# Patient Record
Sex: Female | Born: 1965 | Race: White | Hispanic: No | Smoking: Never smoker
Health system: Southern US, Community
[De-identification: ages and names within clinical notes are randomized; demographics above are authoritative.]

## PROBLEM LIST (undated history)

## (undated) DIAGNOSIS — Z8742 Personal history of other diseases of the female genital tract: Secondary | ICD-10-CM

## (undated) DIAGNOSIS — T7840XA Allergy, unspecified, initial encounter: Secondary | ICD-10-CM

## (undated) DIAGNOSIS — B029 Zoster without complications: Secondary | ICD-10-CM

## (undated) DIAGNOSIS — N879 Dysplasia of cervix uteri, unspecified: Secondary | ICD-10-CM

## (undated) DIAGNOSIS — M1990 Unspecified osteoarthritis, unspecified site: Secondary | ICD-10-CM

## (undated) DIAGNOSIS — F419 Anxiety disorder, unspecified: Secondary | ICD-10-CM

## (undated) DIAGNOSIS — G43909 Migraine, unspecified, not intractable, without status migrainosus: Secondary | ICD-10-CM

## (undated) DIAGNOSIS — I639 Cerebral infarction, unspecified: Secondary | ICD-10-CM

## (undated) DIAGNOSIS — N6009 Solitary cyst of unspecified breast: Secondary | ICD-10-CM

## (undated) HISTORY — DX: Allergy, unspecified, initial encounter: T78.40XA

## (undated) HISTORY — DX: Anxiety disorder, unspecified: F41.9

## (undated) HISTORY — DX: Dysplasia of cervix uteri, unspecified: N87.9

## (undated) HISTORY — DX: Personal history of other diseases of the female genital tract: Z87.42

## (undated) HISTORY — DX: Solitary cyst of unspecified breast: N60.09

## (undated) HISTORY — PX: BREAST CYST ASPIRATION: SHX578

## (undated) HISTORY — DX: Cerebral infarction, unspecified: I63.9

## (undated) HISTORY — DX: Unspecified osteoarthritis, unspecified site: M19.90

## (undated) HISTORY — DX: Migraine, unspecified, not intractable, without status migrainosus: G43.909

## (undated) HISTORY — DX: Zoster without complications: B02.9

---

## 2001-09-29 ENCOUNTER — Encounter: Admission: RE | Admit: 2001-09-29 | Discharge: 2001-09-29 | Payer: Self-pay

## 2002-11-05 HISTORY — PX: LEEP: SHX91

## 2007-06-25 ENCOUNTER — Other Ambulatory Visit: Admission: RE | Admit: 2007-06-25 | Discharge: 2007-06-25 | Payer: Self-pay | Admitting: Family Medicine

## 2007-09-07 ENCOUNTER — Emergency Department: Payer: Self-pay | Admitting: Emergency Medicine

## 2007-09-19 ENCOUNTER — Emergency Department: Payer: Self-pay | Admitting: Emergency Medicine

## 2008-09-01 ENCOUNTER — Other Ambulatory Visit: Admission: RE | Admit: 2008-09-01 | Discharge: 2008-09-01 | Payer: Self-pay | Admitting: Family Medicine

## 2010-01-13 ENCOUNTER — Encounter: Admission: RE | Admit: 2010-01-13 | Discharge: 2010-01-13 | Payer: Self-pay | Admitting: Family Medicine

## 2010-02-08 ENCOUNTER — Ambulatory Visit: Admission: RE | Admit: 2010-02-08 | Discharge: 2010-02-08 | Payer: Self-pay | Admitting: Neurology

## 2010-02-13 ENCOUNTER — Encounter: Admission: RE | Admit: 2010-02-13 | Discharge: 2010-02-13 | Payer: Self-pay | Admitting: Neurology

## 2010-11-05 HISTORY — PX: OTHER SURGICAL HISTORY: SHX169

## 2011-01-24 LAB — CSF CELL COUNT WITH DIFFERENTIAL
RBC Count, CSF: 0 /mm3
Tube #: 3
WBC, CSF: 0 /mm3 (ref 0–5)

## 2011-01-24 LAB — GRAM STAIN

## 2011-01-24 LAB — OLIGOCLONAL BANDS, CSF + SERM

## 2011-01-24 LAB — PROTEIN AND GLUCOSE, CSF: Total  Protein, CSF: 27 mg/dL (ref 15–45)

## 2013-01-21 ENCOUNTER — Ambulatory Visit: Payer: Self-pay

## 2013-03-02 ENCOUNTER — Ambulatory Visit (INDEPENDENT_AMBULATORY_CARE_PROVIDER_SITE_OTHER): Payer: 59 | Admitting: General Surgery

## 2013-03-02 ENCOUNTER — Encounter (INDEPENDENT_AMBULATORY_CARE_PROVIDER_SITE_OTHER): Payer: Self-pay | Admitting: General Surgery

## 2013-03-02 VITALS — BP 110/68 | HR 74 | Resp 18 | Ht 68.0 in | Wt 135.0 lb

## 2013-03-02 DIAGNOSIS — K645 Perianal venous thrombosis: Secondary | ICD-10-CM | POA: Insufficient documentation

## 2013-03-02 MED ORDER — HYDROCORTISONE ACE-PRAMOXINE 2.5-1 % RE CREA: TOPICAL_CREAM | Freq: Four times a day (QID) | RECTAL | Status: DC

## 2013-03-02 NOTE — Patient Instructions (Signed)
Bedrest for the next few days Alternate ice packs and tucks pads Baby wipes after bm's Warm tub soaks after bm's

## 2013-03-17 ENCOUNTER — Ambulatory Visit (INDEPENDENT_AMBULATORY_CARE_PROVIDER_SITE_OTHER): Payer: 59 | Admitting: General Surgery

## 2013-03-17 ENCOUNTER — Encounter (INDEPENDENT_AMBULATORY_CARE_PROVIDER_SITE_OTHER): Payer: Self-pay | Admitting: General Surgery

## 2013-03-17 VITALS — BP 140/76 | HR 68 | Temp 97.6°F | Resp 16 | Ht 68.5 in | Wt 136.0 lb

## 2013-03-17 DIAGNOSIS — K645 Perianal venous thrombosis: Secondary | ICD-10-CM

## 2013-03-17 NOTE — Progress Notes (Signed)
Subjective:     Patient ID: Erika Hurley, female   DOB: 07-16-1966, 47 y.o.   MRN: [id number]  HPI The patient is a 47 year old white female who we saw about 3 weeks ago with a thrombosed external hemorrhoid. Since that time her pain has resolved. She denies any problems with constipation. She denies any rectal bleeding.  Review of Systems     Objective:   Physical Exam On exam her perirectal skin looks good. She has multiple small soft hemorrhoidal skin tags circumferentially. There is no edema or thrombosis.    Assessment:     The patient had a thrombosed external hemorrhoid that has resolved. She has no problems with hygiene given her skin tags.     Plan:     At this point I would recommend that she continue to stay hydrated and avoid periods of constipation. If her hemorrhoids flare up again she agrees to call us immediately. Otherwise we will see her back on a when necessary basis

## 2013-03-17 NOTE — Patient Instructions (Signed)
Avoid constipation and try to stay hydrated

## 2013-03-17 NOTE — Progress Notes (Signed)
Subjective:     Patient ID: Erika Hurley, female   DOB: 09-Aug-1966, 47 y.o.   MRN: [id number]  HPI We're asked to see the patient in consultation by Dr. Clyde Canterbury to evaluate her for hemorrhoids. The patient is a 47 year old white female who states that she has had problems with hemorrhoids off and on since childbirth. This past Saturday she was moving some heavy mulch and that evening noticed some painful swelling in her perirectal area. She denied any bleeding. She denied any constipation.  Review of Systems  Constitutional: Negative.   HENT: Negative.   Eyes: Negative.   Respiratory: Negative.   Cardiovascular: Negative.   Gastrointestinal: Positive for rectal pain.  Endocrine: Negative.   Genitourinary: Negative.   Musculoskeletal: Negative.   Skin: Negative.   Allergic/Immunologic: Negative.   Neurological: Negative.   Hematological: Negative.   Psychiatric/Behavioral: Negative.        Objective:   Physical Exam  Constitutional: She is oriented to person, place, and time. She appears well-developed and well-nourished.  HENT:  Head: Normocephalic and atraumatic.  Eyes: Conjunctivae and EOM are normal. Pupils are equal, round, and reactive to light.  Neck: Normal range of motion. Neck supple.  Cardiovascular: Normal rate, regular rhythm and normal heart sounds.   Pulmonary/Chest: Effort normal and breath sounds normal.  Abdominal: Soft. Bowel sounds are normal.  Genitourinary:  There is a thrombosed and edematous external hemorrhoid that encompasses most of the left lateral perirectal area  Musculoskeletal: Normal range of motion.  Neurological: She is alert and oriented to person, place, and time.  Skin: Skin is warm and dry.  Psychiatric: She has a normal mood and affect. Her behavior is normal.       Assessment:     The patient appears to have a thrombosed external hemorrhoid with a lot of edema surrounding it. Because of this I think cutting it will make some of  the swelling and edema worse.     Plan:     At this point I would recommend bedrest for the next several days. I would alternate ice packs and Tucks pads to the area. I would use baby wipes after bowel movements. We will plan to see her back in the next 3 weeks or so to check her progress.

## 2013-06-11 DIAGNOSIS — I639 Cerebral infarction, unspecified: Secondary | ICD-10-CM | POA: Insufficient documentation

## 2014-01-13 ENCOUNTER — Ambulatory Visit: Payer: Self-pay | Admitting: Family Medicine

## 2014-01-19 ENCOUNTER — Ambulatory Visit: Payer: Self-pay | Admitting: Family Medicine

## 2014-07-08 ENCOUNTER — Ambulatory Visit (INDEPENDENT_AMBULATORY_CARE_PROVIDER_SITE_OTHER): Payer: 59 | Admitting: Internal Medicine

## 2014-07-08 ENCOUNTER — Encounter: Payer: Self-pay | Admitting: Internal Medicine

## 2014-07-08 VITALS — BP 108/76 | HR 53 | Temp 98.0°F | Wt 139.0 lb

## 2014-07-08 DIAGNOSIS — M25519 Pain in unspecified shoulder: Secondary | ICD-10-CM

## 2014-07-08 DIAGNOSIS — M25511 Pain in right shoulder: Secondary | ICD-10-CM

## 2014-07-08 MED ORDER — METHYLPREDNISOLONE ACETATE 80 MG/ML IJ SUSP
80.0000 mg | Freq: Once | INTRAMUSCULAR | Status: AC
  Administered 2014-07-08: 80 mg via INTRAMUSCULAR

## 2014-07-08 NOTE — Addendum Note (Signed)
Addended by: Lurlean Nanny on: 07/08/2014 01:43 PM   Modules accepted: Orders

## 2014-07-08 NOTE — Progress Notes (Signed)
Subjective:    Patient ID: Erika Hurley, female    DOB: [birth year], 48 y.o.   MRN: [id number]  HPI  Pt presents to the clinic today with c/o right arm pain. She reports this started within the last 2 weeks. She describes the pain as sharp. It does seem worse with movement. She denies weakness in that arm. She has had similar pain in the past. She was worked up by Dr. Marlou Starks at [facility] in [place]. She has had a cortisone injection into the joint with some relief. the shoulder exercises she was given are not helping. She has also been taking Aleve OTC without any relief. She would like another joint injection. She has a new pt appt with Dr. Deborra Medina 09/2014.   Review of Systems      History reviewed. No pertinent past medical history.  Current Outpatient Prescriptions  Medication Sig Dispense Refill  . cloNIDine (CATAPRES) 0.1 MG tablet Take 0.1 mg by mouth 2 (two) times daily. 1/2 2 times daily      . hydrocortisone-pramoxine (ANALPRAM-HC) 2.5-1 % rectal cream Place rectally 4 (four) times daily. For irritated and painful hemorrhoids  15 g  2  . SUMAtriptan (IMITREX) 50 MG tablet Take 50 mg by mouth every 2 (two) hours as needed for migraine or headache. May repeat in 2 hours if headache persists or recurs.       No current facility-administered medications for this visit.    Allergies  Allergen Reactions  . Sulfa Antibiotics Anaphylaxis    Family History  Problem Relation Age of Onset  . Cancer Mother   . Hypertension Mother     History   Social History  . Marital Status: Divorced    Spouse Name: N/A    Number of Children: N/A  . Years of Education: N/A   Occupational History  . Not on file.   Social History Main Topics  . Smoking status: Never Smoker   . Smokeless tobacco: Not on file  . Alcohol Use: Yes  . Drug Use: No  . Sexual Activity: Not on file   Other Topics Concern  . Not on file   Social History Narrative  . No narrative on file      Constitutional: Denies fever, malaise, fatigue, headache or abrupt weight changes.  Musculoskeletal: Pt reports right shoulder pain. Denies decrease in range of motion, difficulty with gait, muscle pain.    No other specific complaints in a complete review of systems (except as listed in HPI above).  Objective:   Physical Exam  BP 108/76  Pulse 53  Temp(Src) 98 F (36.7 C) (Oral)  Wt 139 lb (63.05 kg)  SpO2 98% Wt Readings from Last 3 Encounters:  07/08/14 139 lb (63.05 kg)  03/17/13 136 lb (61.689 kg)  03/02/13 135 lb (61.236 kg)    General: Appears her stated age, well developed, well nourished in NAD. Cardiovascular: Normal rate and rhythm. S1,S2 noted.  No murmur, rubs or gallops noted.  Pulmonary/Chest: Normal effort and positive vesicular breath sounds. No respiratory distress. No wheezes, rales or ronchi noted.  Musculoskeletal: Normal internal and external ROM of right shoulder. No crepitus noted with ROM. No pain with palpation of the AC joint.        Assessment & Plan:   Right shoulder pain:  Will request xray of right shoulder from Dr. Marlou Starks at [facility] today, advised her that I do not no do joint injections I will give her 80 mg  Depo IM Continue aleve and shoulder exercises  RTC in 09/2014 for your new pt appt with Dr. Deborra Medina

## 2014-07-08 NOTE — Progress Notes (Signed)
Pre visit review using our clinic review tool, if applicable. No additional management support is needed unless otherwise documented below in the visit note. 

## 2014-07-08 NOTE — Patient Instructions (Addendum)

## 2014-07-13 ENCOUNTER — Encounter: Payer: Self-pay | Admitting: Family Medicine

## 2014-07-13 ENCOUNTER — Other Ambulatory Visit: Payer: Self-pay | Admitting: Internal Medicine

## 2014-07-13 DIAGNOSIS — L989 Disorder of the skin and subcutaneous tissue, unspecified: Secondary | ICD-10-CM

## 2014-08-17 ENCOUNTER — Encounter: Payer: Self-pay | Admitting: Family Medicine

## 2014-09-06 ENCOUNTER — Ambulatory Visit (INDEPENDENT_AMBULATORY_CARE_PROVIDER_SITE_OTHER): Payer: 59 | Admitting: Family Medicine

## 2014-09-06 ENCOUNTER — Encounter: Payer: Self-pay | Admitting: Family Medicine

## 2014-09-06 VITALS — BP 116/72 | HR 65 | Temp 97.9°F | Ht 68.0 in | Wt 136.8 lb

## 2014-09-06 DIAGNOSIS — G43909 Migraine, unspecified, not intractable, without status migrainosus: Secondary | ICD-10-CM | POA: Insufficient documentation

## 2014-09-06 DIAGNOSIS — Z8669 Personal history of other diseases of the nervous system and sense organs: Secondary | ICD-10-CM

## 2014-09-06 DIAGNOSIS — Z1211 Encounter for screening for malignant neoplasm of colon: Secondary | ICD-10-CM

## 2014-09-06 DIAGNOSIS — R232 Flushing: Secondary | ICD-10-CM | POA: Insufficient documentation

## 2014-09-06 DIAGNOSIS — G919 Hydrocephalus, unspecified: Secondary | ICD-10-CM | POA: Insufficient documentation

## 2014-09-06 DIAGNOSIS — Z8673 Personal history of transient ischemic attack (TIA), and cerebral infarction without residual deficits: Secondary | ICD-10-CM | POA: Insufficient documentation

## 2014-09-06 DIAGNOSIS — N951 Menopausal and female climacteric states: Secondary | ICD-10-CM

## 2014-09-06 NOTE — Patient Instructions (Signed)
It was great to meet you. We will call you with your GI referral for your colonoscopy.  Please make an appointment to see Dr. Lorelei Pont on your way out.

## 2014-09-06 NOTE — Assessment & Plan Note (Signed)
>  30 minutes spent in face to face time with patient, >50% spent in counselling or coordination of care,reviewing complex history. Continue to follow along with [facility].

## 2014-09-06 NOTE — Progress Notes (Signed)
Pre visit review using our clinic review tool, if applicable. No additional management support is needed unless otherwise documented below in the visit note. 

## 2014-09-06 NOTE — Progress Notes (Signed)
Subjective:    Patient ID: Erika Hurley, female    DOB: [birth year], 48 y.o.   MRN: [id number]  HPI  Pleasant 48 yo female with complex medical history here to establish care.  Has OBGYN who has been managing her hot flashes with clonidine. Had been on OCPs most of her adult life until she was taken off of them when MRI showed she had been having strokes. Since then- severe hot flashes, irritability, vaginal dryness.  Clonidine has helped some with hot flashes.  Complex neuro history- awaiting records. Referred to neuro after she continued to smell cigarette smoke even though she was not around cigarettes.' MRI done in 2011 (in [place]) showed the following: MRI HEAD WITHOUT CONTRAST  Technique: Multiplanar, multiecho pulse sequences of the brain and surrounding structures were obtained according to standard protocol without intravenous contrast.  Comparison: None  Findings: Diffusion imaging does not show any acute or subacute infarction. The brainstem and cerebellum are normal. Within the cerebral hemispheres, there is scattered foci of abnormal FLAIR and T2 signal affecting the hemispheric white matter. These could be old small vessel insults are could represent foci of demyelination. No cortical or large vessel infarction is seen. No mesial temporal lesion. No pituitary mass. No orbital lesion is seen. Paranasal sinuses, middle ears and mastoids are clear.  IMPRESSION: No acute abnormality. The patient does have scattered foci of abnormal FLAIR and T2 signal within the hemispheric white matter, abnormal for this age. The differential diagnosis is that of an early manifestation of small vessel disease versus demyelinating disease.  Provider: Mancel Bale  Since that time, has been followed by Dr. Lavone Neri- [facility]. Notes reviewed in Care everywhere that are available- he is monitoring her for abnormal MRI/?early dementia and hydrocephalus.   Having routine cognitive testing and serial MRIs.  She feels overall she is doing well.  Has never had a colonoscopy.  Current Outpatient Prescriptions on File Prior to Visit  Medication Sig Dispense Refill  . cloNIDine (CATAPRES) 0.1 MG tablet Take 0.1 mg by mouth 2 (two) times daily. 1/2 2 times daily    . hydrocortisone-pramoxine (ANALPRAM-HC) 2.5-1 % rectal cream Place rectally 4 (four) times daily. For irritated and painful hemorrhoids 15 g 2  . SUMAtriptan (IMITREX) 50 MG tablet Take 50 mg by mouth every 2 (two) hours as needed for migraine or headache. May repeat in 2 hours if headache persists or recurs.     No current facility-administered medications on file prior to visit.    Allergies  Allergen Reactions  . Sulfa Antibiotics Anaphylaxis    Past Medical History  Diagnosis Date  . Arthritis   . Migraines   . Stroke     Past Surgical History  Procedure Laterality Date  . Spinal tap  2012  . Leap  2004    Family History  Problem Relation Age of Onset  . Cancer Mother   . Hypertension Mother   . Hyperlipidemia Mother   . Kidney disease Mother   . Stroke Father   . Heart disease Father   . Hypertension Father   . Hyperlipidemia Father   . Kidney disease Father   . Cancer Maternal Grandfather     History   Social History  . Marital Status: Divorced    Spouse Name: N/A    Number of Children: N/A  . Years of Education: N/A   Occupational History  . Not on file.   Social History Main Topics  . Smoking status: Never  Smoker   . Smokeless tobacco: Never Used  . Alcohol Use: Yes  . Drug Use: No  . Sexual Activity: No   Other Topics Concern  . Not on file   Social History Narrative   The PMH, PSH, Social History, Family History, Medications, and allergies have been reviewed in [facility], and have been updated if relevant.   Review of Systems  Constitutional: Negative.   Eyes: Negative.   Respiratory: Negative.   Cardiovascular: Negative.     Gastrointestinal: Negative.   Endocrine: Negative.   Genitourinary: Negative.   Allergic/Immunologic: Negative.   Neurological: Positive for headaches. Negative for dizziness, tremors, seizures, syncope, facial asymmetry, speech difficulty, weakness and numbness.  Hematological: Negative.   Psychiatric/Behavioral: Negative.   All other systems reviewed and are negative.  See HPI    Objective:   Physical Exam  Constitutional: She is oriented to person, place, and time. She appears well-developed and well-nourished. No distress.  HENT:  Head: Normocephalic.  Neck: Normal range of motion. Neck supple.  Cardiovascular: Normal rate and regular rhythm.   Pulmonary/Chest: Effort normal and breath sounds normal.  Musculoskeletal: Normal range of motion.  Neurological: She is alert and oriented to person, place, and time. No cranial nerve deficit. Coordination normal.  Skin: Skin is warm and dry.  Psychiatric: She has a normal mood and affect. Her behavior is normal. Judgment and thought content normal.  Nursing note and vitals reviewed.  BP 116/72 mmHg  Pulse 65  Temp(Src) 97.9 F (36.6 C) (Oral)  Ht 5\' 8"  (1.727 m)  Wt 136 lb 12 oz (62.029 kg)  BMI 20.80 kg/m2  SpO2 97%        Assessment & Plan:

## 2014-09-07 ENCOUNTER — Encounter: Payer: Self-pay | Admitting: Family Medicine

## 2014-09-08 ENCOUNTER — Ambulatory Visit (INDEPENDENT_AMBULATORY_CARE_PROVIDER_SITE_OTHER): Payer: 59 | Admitting: Family Medicine

## 2014-09-08 ENCOUNTER — Encounter: Payer: Self-pay | Admitting: Family Medicine

## 2014-09-08 VITALS — BP 104/70 | HR 64 | Temp 98.5°F | Ht 68.0 in | Wt 139.5 lb

## 2014-09-08 DIAGNOSIS — M7541 Impingement syndrome of right shoulder: Secondary | ICD-10-CM

## 2014-09-08 DIAGNOSIS — M75101 Unspecified rotator cuff tear or rupture of right shoulder, not specified as traumatic: Secondary | ICD-10-CM

## 2014-09-08 DIAGNOSIS — M7551 Bursitis of right shoulder: Secondary | ICD-10-CM

## 2014-09-08 DIAGNOSIS — M25511 Pain in right shoulder: Secondary | ICD-10-CM

## 2014-09-08 MED ORDER — SERTRALINE HCL 100 MG PO TABS: 100.0000 mg | ORAL_TABLET | Freq: Every day | ORAL | Status: DC

## 2014-09-08 MED ORDER — ALPRAZOLAM 0.25 MG PO TABS: 0.2500 mg | ORAL_TABLET | Freq: Every evening | ORAL | Status: DC | PRN

## 2014-09-08 MED ORDER — METHYLPREDNISOLONE ACETATE 40 MG/ML IJ SUSP
80.0000 mg | Freq: Once | INTRAMUSCULAR | Status: AC
  Administered 2014-09-08: 80 mg via INTRA_ARTICULAR

## 2014-09-08 NOTE — Progress Notes (Signed)
Dr. Frederico Hamman T. Treshawn Allen, Erika Hurley, [facility] [place], [address] Phone: [phone] Fax: [phone]  09/08/2014  Patient: Erika Hurley, MRN: [id number], DOB: Dec 13, 1965, 48 y.o.  Primary Physician:  Arnette Norris, Erika Hurley  Chief Complaint: Shoulder Pain  Subjective:   Consulting physician: Dr. Deborra Medina. Reason: Right shoulder pain  This 48 y.o. female patient noted above presents with shoulder pain that has been ongoing for 2 mo there is no history of trauma or accident recently The patient denies neck pain or radicular symptoms. Denies dislocation, subluxation, separation of the shoulder. The patient does complain of pain in the overhead plane with significant painful arc of motion.  R sided shoulder pain for about 1 month and pain with abduction.  Impingement.   Saw Dr. Noemi Chapel in [place]. 2 years ago.  ? Left shoulder subac bursitis.   Medications Tried: Tylenol, NSAIDS Ice or Heat: minimally helpful Tried PT: she did do this for her left shoulder2 years ago.  Prior shoulder Injury: No Prior surgery: No Prior fracture: No  The PMH, PSH, Social History, Family History, Medications, and allergies have been reviewed in [facility], and have been updated if relevant.  GEN: No fevers, chills. Nontoxic. Primarily MSK c/o today. MSK: Detailed in the HPI GI: tolerating PO intake without difficulty Neuro: No numbness, parasthesias, or tingling associated. Otherwise, the pertinent positives and negatives are listed above and in the HPI, otherwise a full review of systems has been reviewed and is negative unless noted positive.   Objective:   Blood pressure 104/70, pulse 64, temperature 98.5 F (36.9 C), temperature source Oral, height 5\' 8"  (1.727 m), weight 139 lb 8 oz (63.277 kg).  GEN: Well-developed,well-nourished,in no acute distress; alert,appropriate and cooperative throughout examination HEENT: Normocephalic and  atraumatic without obvious abnormalities. Ears, externally no deformities PULM: Breathing comfortably in no respiratory distress EXT: No clubbing, cyanosis, or edema PSYCH: Normally interactive. Cooperative during the interview. Pleasant. Friendly and conversant. Not anxious or depressed appearing. Normal, full affect.  Shoulder: R Inspection: No muscle wasting or winging Ecchymosis/edema: neg  AC joint, scapula, clavicle: NT Cervical spine: NT, full ROM Spurling's: neg Abduction: full, 5/5 Flexion: full, 5/5 IR, full, lift-off: 5/5 ER at neutral: full, 5/5 AC crossover: neg Neer: pos Hawkins: pos Drop Test: neg Empty Can: pos Supraspinatus insertion: mild-mod T Bicipital groove: NT Speed's: neg Yergason's: neg Sulcus sign: neg Scapular dyskinesis: none C5-T1 intact  Neuro: Sensation intact Grip 5/5   Radiology: No results found.  Assessment and Plan:    Right shoulder pain - Plan: methylPREDNISolone acetate (DEPO-MEDROL) injection 80 mg  Bursitis of shoulder, right [M75.51]  Rotator cuff impingement syndrome, right [M75.101]  Rotator cuff syndrome, right [M75.101]  The patient has 2 months of symptoms, and she did quite well about 2 years ago when she had a subacromial injection in the opposite shoulder by Dr. Noemi Chapel.  She is having some significant impingement on the right.  Strength is perfectly normal.  Cuff appears intact.  Minimal AC joint involvement.  SubAC Injection, R Verbal consent was obtained from the patient. Risks (including rare infection), benefits, and alternatives were explained. Patient prepped with Chloraprep and Ethyl Chloride used for anesthesia. The subacromial space was injected using the posterior approach. The patient tolerated the procedure well and had decreased pain post injection. No complications. Injection: 8 cc of Lidocaine 1% and Depo-Medrol 80 mg. Needle: 22 gauge    Follow-up: No Follow-up on file.  New  Prescriptions   No  medications on file   No orders of the defined types were placed in this encounter.    Signed,  Maud Deed. Roma Bierlein, Erika Hurley   Patient's Medications  New Prescriptions   No medications on file  Previous Medications   CLONIDINE (CATAPRES) 0.1 MG TABLET    Take 0.1 mg by mouth 2 (two) times daily. 1/2 2 times daily   HYDROCORTISONE-PRAMOXINE (ANALPRAM-HC) 2.5-1 % RECTAL CREAM    Place rectally 4 (four) times daily. For irritated and painful hemorrhoids   SUMATRIPTAN (IMITREX) 50 MG TABLET    Take 50 mg by mouth every 2 (two) hours as needed for migraine or headache. May repeat in 2 hours if headache persists or recurs.  Modified Medications   Modified Medication Previous Medication   ALPRAZOLAM (XANAX) 0.25 MG TABLET ALPRAZolam (XANAX) 0.25 MG tablet      Take 1 tablet (0.25 mg total) by mouth at bedtime as needed for anxiety.    Take 1 tablet (0.25 mg total) by mouth at bedtime as needed for anxiety.   SERTRALINE (ZOLOFT) 100 MG TABLET sertraline (ZOLOFT) 100 MG tablet      Take 1 tablet (100 mg total) by mouth daily. Take 1.5 tabs by mouth daily    Take 1 tablet (100 mg total) by mouth daily. Take 1.5 tabs by mouth daily  Discontinued Medications   No medications on file

## 2014-09-08 NOTE — Telephone Encounter (Signed)
Pt recently established care. Ok to fill?

## 2014-09-08 NOTE — Progress Notes (Signed)
Pre visit review using our clinic review tool, if applicable. No additional management support is needed unless otherwise documented below in the visit note. 

## 2014-09-08 NOTE — Addendum Note (Signed)
Addended by: Modena Nunnery on: 09/08/2014 04:15 PM   Modules accepted: Orders

## 2014-10-12 ENCOUNTER — Telehealth: Payer: Self-pay | Admitting: Family Medicine

## 2014-10-12 DIAGNOSIS — H209 Unspecified iridocyclitis: Secondary | ICD-10-CM | POA: Insufficient documentation

## 2014-10-12 NOTE — Telephone Encounter (Signed)
Pt is requesting a refferal to Dr Zadie Rhine [phone]  At [address] [place], [place] [address]. She has a condition called iritis? And was seen yesterday and will be seen again Friday.

## 2014-10-12 NOTE — Telephone Encounter (Signed)
Referral placed.

## 2014-10-18 ENCOUNTER — Encounter: Payer: Self-pay | Admitting: Family Medicine

## 2014-10-28 ENCOUNTER — Encounter: Payer: Self-pay | Admitting: Family Medicine

## 2014-11-01 ENCOUNTER — Ambulatory Visit (INDEPENDENT_AMBULATORY_CARE_PROVIDER_SITE_OTHER)
Admission: RE | Admit: 2014-11-01 | Discharge: 2014-11-01 | Disposition: A | Discharge status: Home / Self Care | Payer: 59 | Source: Ambulatory Visit | Attending: Family Medicine | Admitting: Family Medicine

## 2014-11-01 ENCOUNTER — Encounter: Payer: Self-pay | Admitting: Family Medicine

## 2014-11-01 ENCOUNTER — Ambulatory Visit (INDEPENDENT_AMBULATORY_CARE_PROVIDER_SITE_OTHER): Payer: 59 | Admitting: Family Medicine

## 2014-11-01 VITALS — BP 100/60 | HR 60 | Temp 97.7°F | Wt 137.5 lb

## 2014-11-01 DIAGNOSIS — M75101 Unspecified rotator cuff tear or rupture of right shoulder, not specified as traumatic: Secondary | ICD-10-CM

## 2014-11-01 DIAGNOSIS — M7541 Impingement syndrome of right shoulder: Secondary | ICD-10-CM

## 2014-11-01 DIAGNOSIS — M7551 Bursitis of right shoulder: Secondary | ICD-10-CM

## 2014-11-01 MED ORDER — DICLOFENAC SODIUM 75 MG PO TBEC: 75.0000 mg | DELAYED_RELEASE_TABLET | Freq: Two times a day (BID) | ORAL | Status: DC

## 2014-11-01 NOTE — Progress Notes (Signed)
Dr. Frederico Hamman T. Izic Stfort, MD, [facility] [place], [address] Phone: [phone] Fax: [phone]  11/01/2014  Patient: Erika Hurley, MRN: [id number], DOB: 1966-06-27, 48 y.o.  Primary Physician:  Arnette Norris, MD  Chief Complaint: Shoulder Pain  Subjective:   Right shoulder, pain down through the right shoulder. I saw her about 6 weeks ago, and at that point we did a subacromial injection on the right shoulder. She had relief of symptoms for about 7-10 days, but then her symptoms basically have come back.  I was mistaken on her last office visit, and I thought that she had done formal physical therapy before when she had seen Dr. Noemi Chapel 2 years ago for her left-sided shoulder pain. She has not. She has not been doing her home exercise program.  No new injury   09/08/2014 Last OV with Owens Loffler, MD  Consulting physician: Dr. Deborra Medina. Reason: Right shoulder pain  This 48 y.o. female patient noted above presents with shoulder pain that has been ongoing for 2 mo there is no history of trauma or accident recently The patient denies neck pain or radicular symptoms. Denies dislocation, subluxation, separation of the shoulder. The patient does complain of pain in the overhead plane with significant painful arc of motion.  R sided shoulder pain for about 1 month and pain with abduction.  Impingement.   Saw Dr. Noemi Chapel in [place]. 2 years ago.  ? Left shoulder subac bursitis.   Medications Tried: Tylenol, NSAIDS Ice or Heat: minimally helpful Tried PT: she did do this for her left shoulder2 years ago.  Prior shoulder Injury: No Prior surgery: No Prior fracture: No  The PMH, PSH, Social History, Family History, Medications, and allergies have been reviewed in [facility], and have been updated if relevant.  GEN: No fevers, chills. Nontoxic. Primarily MSK c/o today. MSK: Detailed in the HPI GI: tolerating PO intake  without difficulty Neuro: No numbness, parasthesias, or tingling associated. Otherwise, the pertinent positives and negatives are listed above and in the HPI, otherwise a full review of systems has been reviewed and is negative unless noted positive.   Objective:   Blood pressure 100/60, pulse 60, temperature 97.7 F (36.5 C), temperature source Oral, weight 137 lb 8 oz (62.37 kg).  GEN: Well-developed,well-nourished,in no acute distress; alert,appropriate and cooperative throughout examination HEENT: Normocephalic and atraumatic without obvious abnormalities. Ears, externally no deformities PULM: Breathing comfortably in no respiratory distress EXT: No clubbing, cyanosis, or edema PSYCH: Normally interactive. Cooperative during the interview. Pleasant. Friendly and conversant. Not anxious or depressed appearing. Normal, full affect.  Shoulder: R Inspection: No muscle wasting or winging Ecchymosis/edema: neg  AC joint, scapula, clavicle: NT Cervical spine: NT, full ROM Spurling's: neg Abduction: full, 5/5 Flexion: full, 5/5 IR, full, lift-off: 5/5 ER at neutral: full, 5/5 AC crossover: neg Neer: pos Hawkins: pos Drop Test: neg Empty Can: pos Supraspinatus insertion: mild-mod T Bicipital groove: NT Speed's: neg Yergason's: neg Sulcus sign: neg Scapular dyskinesis: none C5-T1 intact  Neuro: Sensation intact Grip 5/5   Radiology: Dg Shoulder Right  11/01/2014   CLINICAL DATA:  Impingement syndrome.  Bursitis.  EXAM: RIGHT SHOULDER - 2+ VIEW  COMPARISON:  None.  FINDINGS: There is no evidence of fracture or dislocation. There is no evidence of arthropathy or other focal bone abnormality. Soft tissues are unremarkable.  IMPRESSION: Negative.   Electronically Signed   By: Marcello Moores  Register   On: 11/01/2014 11:46  Assessment and Plan:    Rotator cuff impingement syndrome, right [M75.101] - Plan: DG Shoulder Right, Ambulatory referral to Physical Therapy  Bursitis of  shoulder, right [M75.51] - Plan: DG Shoulder Right, Ambulatory referral to Physical Therapy  >25 minutes spent in face to face time with patient, >50% spent in counselling or coordination of care: the patient wanted me to repeat her subacromial injection, but given her prior response and the timing, do not think that that would be appropriate.   We reviewed the involved anatomy again. Her strength is excellent, really do not think that she has had any significant tearing of her cuff. She has not filled classic conservative management, and I am going to have her start some oral Voltaren, and start with some formal physical therapy for rotator cuff strengthening and scapular stabilization.  A rehabilitation program from the [employer] [facility] [employer] was reviewed with the patient face to face for their condition.   Follow-up: Return in about 6 weeks (around 12/13/2014).  New Prescriptions   DICLOFENAC (VOLTAREN) 75 MG EC TABLET    Take 1 tablet (75 mg total) by mouth 2 (two) times daily.   Orders Placed This Encounter  Procedures  . DG Shoulder Right  . Ambulatory referral to Physical Therapy    Signed,  Frederico Hamman T. Su Duma, MD   Patient's Medications  New Prescriptions   DICLOFENAC (VOLTAREN) 75 MG EC TABLET    Take 1 tablet (75 mg total) by mouth 2 (two) times daily.  Previous Medications   ALPRAZOLAM (XANAX) 0.25 MG TABLET    Take 1 tablet (0.25 mg total) by mouth at bedtime as needed for anxiety.   CLONIDINE (CATAPRES) 0.1 MG TABLET    Take 0.1 mg by mouth 2 (two) times daily. 1/2 2 times daily   HYDROCORTISONE-PRAMOXINE (ANALPRAM-HC) 2.5-1 % RECTAL CREAM    Place rectally 4 (four) times daily. For irritated and painful hemorrhoids   SERTRALINE (ZOLOFT) 100 MG TABLET    Take 1 tablet (100 mg total) by mouth daily. Take 1.5 tabs by mouth daily   SUMATRIPTAN (IMITREX) 50 MG TABLET    Take 50 mg by mouth every 2 (two) hours as needed for migraine or headache. May repeat in 2  hours if headache persists or recurs.  Modified Medications   No medications on file  Discontinued Medications   No medications on file

## 2014-11-01 NOTE — Progress Notes (Signed)
Pre visit review using our clinic review tool, if applicable. No additional management support is needed unless otherwise documented below in the visit note. 

## 2014-11-02 ENCOUNTER — Ambulatory Visit: Payer: 59 | Admitting: Family Medicine

## 2015-03-12 IMAGING — CR DG LUMBAR SPINE 2-3V
1 series · 3 of 3 positions shown · non-contrast
Comparison: none

REASON FOR EXAM: back pain
COMMENTS:

PROCEDURE:     DXR - DXR LUMBAR SPINE AP AND LATERAL  - January 21, 2013  [DATE]
RESULT:     Degenerative changes lumbar spine. No acute abnormality.
Pedicles are intact.

[Series 1: t lumbar spine ap · 0.14mm/px · 3 of 3 slices shown]
[im 1/3]
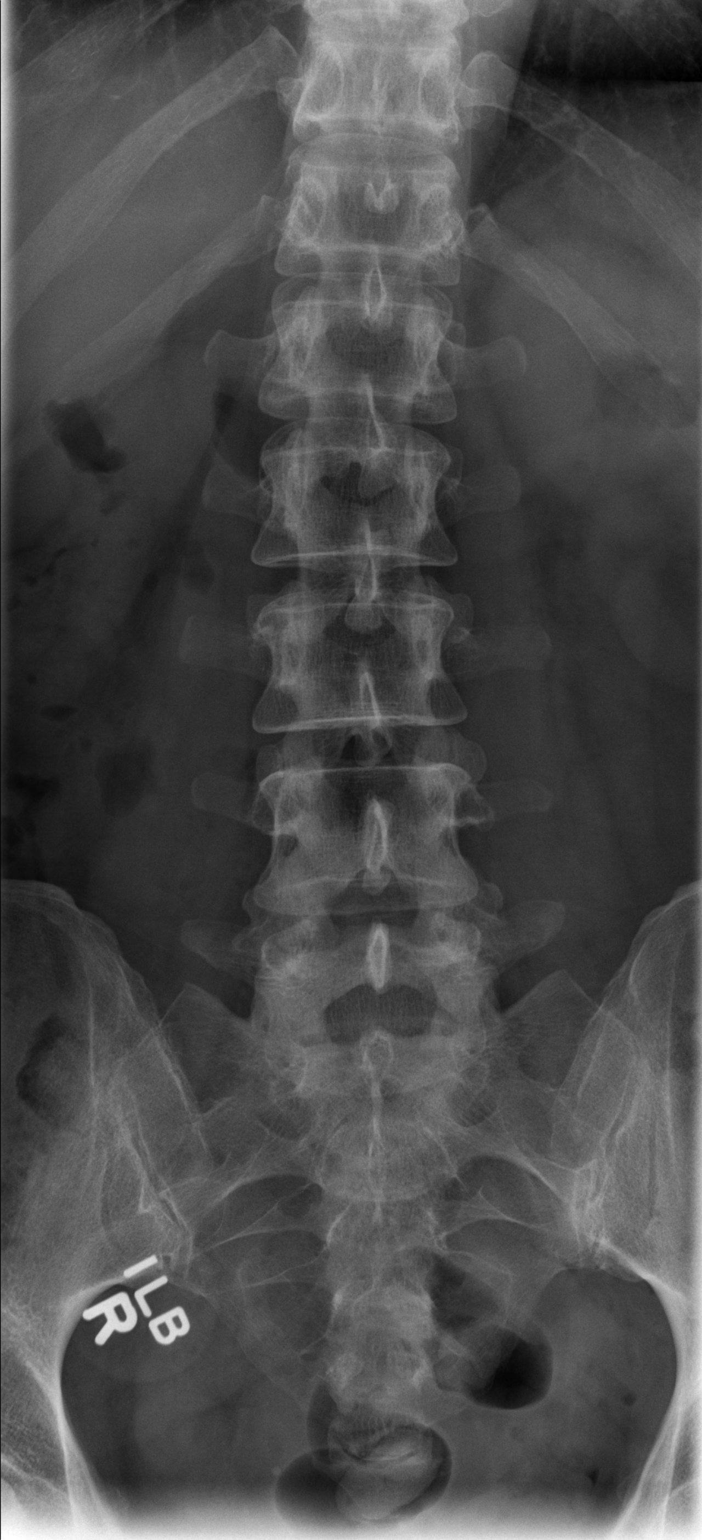
[im 2/3]
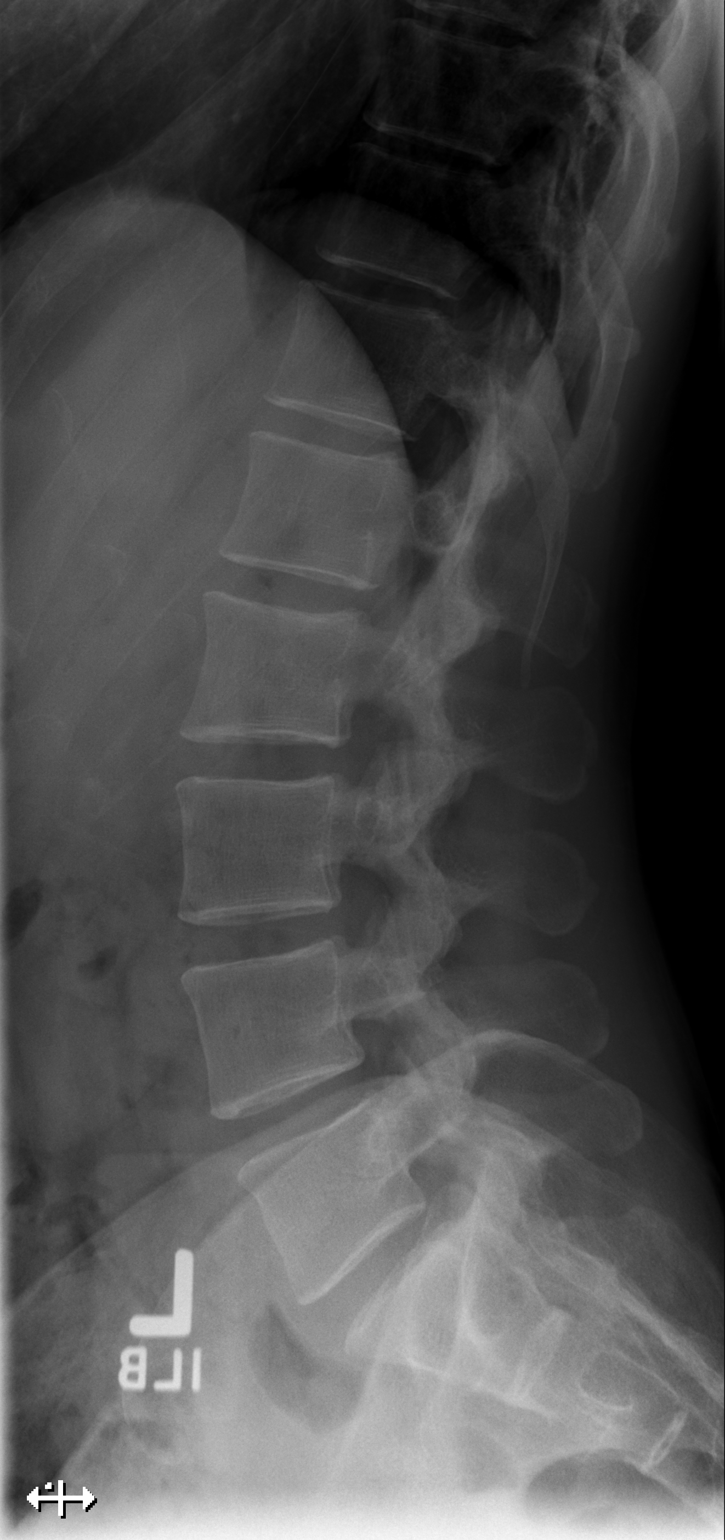
[im 3/3]
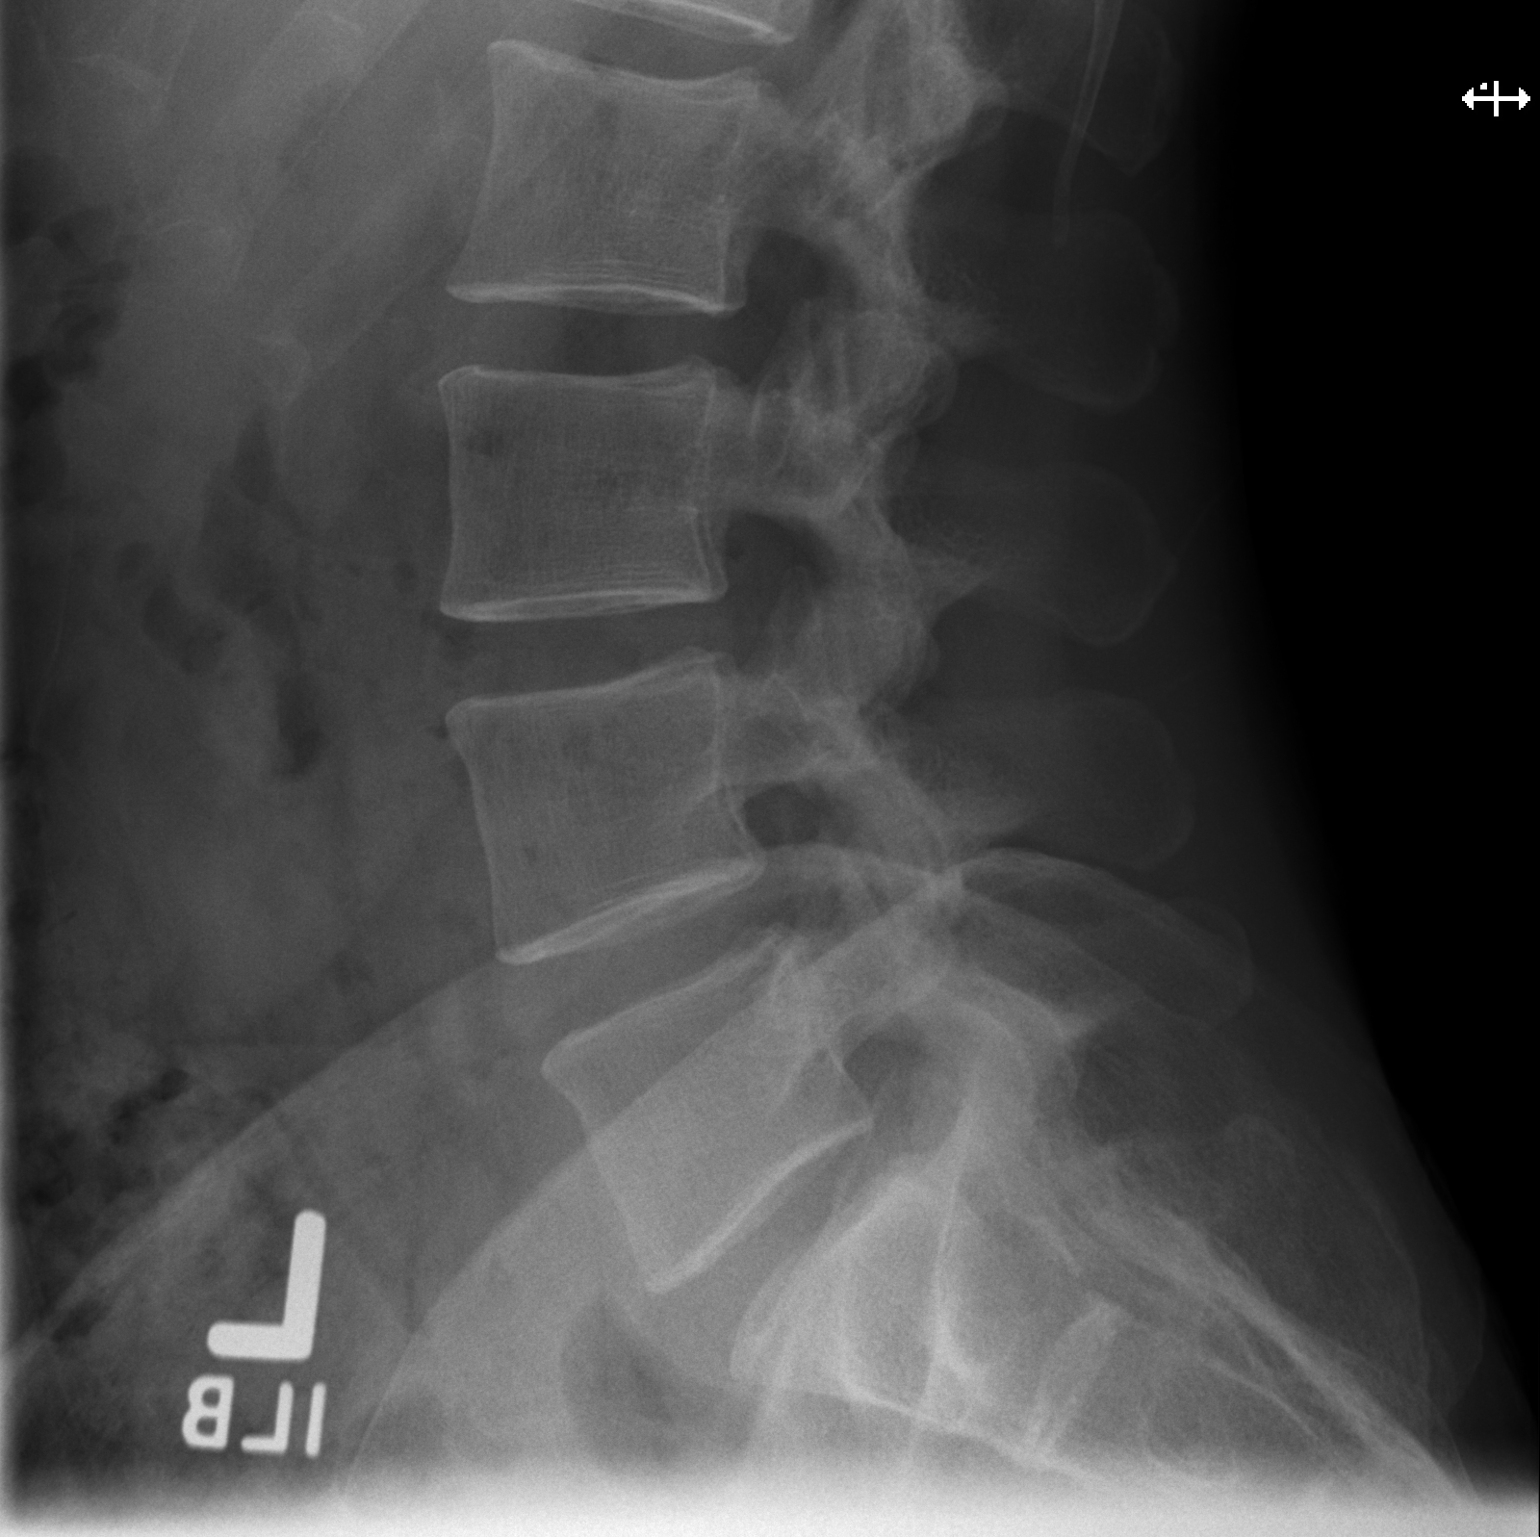

[3 of 3 positions shown; findings below may reference images not displayed]

IMPRESSION: No acute abnormality.

## 2015-09-07 LAB — HM PAP SMEAR: HM Pap smear: NEGATIVE

## 2015-09-15 ENCOUNTER — Ambulatory Visit (INDEPENDENT_AMBULATORY_CARE_PROVIDER_SITE_OTHER): Payer: 59 | Admitting: Nurse Practitioner

## 2015-09-15 ENCOUNTER — Encounter: Payer: Self-pay | Admitting: Nurse Practitioner

## 2015-09-15 VITALS — BP 102/62 | HR 72 | Temp 97.9°F | Ht 68.0 in | Wt 141.0 lb

## 2015-09-15 DIAGNOSIS — N951 Menopausal and female climacteric states: Secondary | ICD-10-CM | POA: Diagnosis not present

## 2015-09-15 DIAGNOSIS — Z130 Encounter for screening for diseases of the blood and blood-forming organs and certain disorders involving the immune mechanism: Secondary | ICD-10-CM

## 2015-09-15 DIAGNOSIS — E559 Vitamin D deficiency, unspecified: Secondary | ICD-10-CM | POA: Diagnosis not present

## 2015-09-15 DIAGNOSIS — R232 Flushing: Secondary | ICD-10-CM

## 2015-09-15 DIAGNOSIS — K645 Perianal venous thrombosis: Secondary | ICD-10-CM

## 2015-09-15 DIAGNOSIS — Z1329 Encounter for screening for other suspected endocrine disorder: Secondary | ICD-10-CM | POA: Diagnosis not present

## 2015-09-15 DIAGNOSIS — Z8673 Personal history of transient ischemic attack (TIA), and cerebral infarction without residual deficits: Secondary | ICD-10-CM

## 2015-09-15 DIAGNOSIS — Z7189 Other specified counseling: Secondary | ICD-10-CM

## 2015-09-15 DIAGNOSIS — Z23 Encounter for immunization: Secondary | ICD-10-CM | POA: Insufficient documentation

## 2015-09-15 DIAGNOSIS — Z8669 Personal history of other diseases of the nervous system and sense organs: Secondary | ICD-10-CM

## 2015-09-15 DIAGNOSIS — Z131 Encounter for screening for diabetes mellitus: Secondary | ICD-10-CM | POA: Diagnosis not present

## 2015-09-15 DIAGNOSIS — E785 Hyperlipidemia, unspecified: Secondary | ICD-10-CM | POA: Diagnosis not present

## 2015-09-15 DIAGNOSIS — Z7689 Persons encountering health services in other specified circumstances: Secondary | ICD-10-CM

## 2015-09-15 DIAGNOSIS — H209 Unspecified iridocyclitis: Secondary | ICD-10-CM

## 2015-09-15 LAB — CBC WITH DIFFERENTIAL/PLATELET
BASOS PCT: 0.5 % (ref 0.0–3.0)
Basophils Absolute: 0 10*3/uL (ref 0.0–0.1)
EOS PCT: 0.6 % (ref 0.0–5.0)
Eosinophils Absolute: 0 10*3/uL (ref 0.0–0.7)
HCT: 40.1 % (ref 36.0–46.0)
HEMOGLOBIN: 13.3 g/dL (ref 12.0–15.0)
LYMPHS ABS: 2.2 10*3/uL (ref 0.7–4.0)
Lymphocytes Relative: 33.8 % (ref 12.0–46.0)
MCHC: 33.2 g/dL (ref 30.0–36.0)
MCV: 93.9 fl (ref 78.0–100.0)
MONO ABS: 0.4 10*3/uL (ref 0.1–1.0)
Monocytes Relative: 6.9 % (ref 3.0–12.0)
NEUTROS PCT: 58.2 % (ref 43.0–77.0)
Neutro Abs: 3.8 10*3/uL (ref 1.4–7.7)
Platelets: 317 10*3/uL (ref 150.0–400.0)
RBC: 4.27 Mil/uL (ref 3.87–5.11)
RDW: 12.7 % (ref 11.5–15.5)
WBC: 6.4 10*3/uL (ref 4.0–10.5)

## 2015-09-15 LAB — COMPREHENSIVE METABOLIC PANEL
ALBUMIN: 4.5 g/dL (ref 3.5–5.2)
ALT: 14 U/L (ref 0–35)
AST: 21 U/L (ref 0–37)
Alkaline Phosphatase: 104 U/L (ref 39–117)
BUN: 10 mg/dL (ref 6–23)
CHLORIDE: 104 meq/L (ref 96–112)
CO2: 28 mEq/L (ref 19–32)
Calcium: 10 mg/dL (ref 8.4–10.5)
Creatinine, Ser: 0.87 mg/dL (ref 0.40–1.20)
GFR: 72.83 mL/min (ref >60.00)
Glucose, Bld: 100 mg/dL — ABNORMAL HIGH (ref 70–99)
POTASSIUM: 4.7 meq/L (ref 3.5–5.1)
SODIUM: 141 meq/L (ref 135–145)
Total Bilirubin: 0.5 mg/dL (ref 0.2–1.2)
Total Protein: 7.7 g/dL (ref 6.0–8.3)

## 2015-09-15 LAB — TSH: TSH: 1.27 u[IU]/mL (ref 0.35–4.50)

## 2015-09-15 LAB — LDL CHOLESTEROL, DIRECT: LDL DIRECT: 192 mg/dL

## 2015-09-15 LAB — HEMOGLOBIN A1C: HEMOGLOBIN A1C: 5.9 % (ref 4.6–6.5)

## 2015-09-15 LAB — VITAMIN D 25 HYDROXY (VIT D DEFICIENCY, FRACTURES): VITD: 30.92 ng/mL (ref 30.00–100.00)

## 2015-09-15 NOTE — Progress Notes (Signed)
Patient ID: Erika Hurley, female    DOB: 1966/05/14  Age: 49 y.o. MRN: [id number]  CC: Establish Care   HPI Erika Hurley presents for establishing care (from [facility]) and need for tdap update.   1) Seen by Dr. Marjory Lies in 2015 Denies need for refills.  Lab work- needs Tdap- okay to update  History Erika Hurley has a past medical history of Arthritis; Migraines; and Stroke ([place]).   Erika Hurley has past surgical history that includes spinal tap (2012) and leap (2004).   Her family history includes Cancer in her maternal grandfather and mother; Heart disease in her father; Hyperlipidemia in her father and mother; Hypertension in her father and mother; Kidney disease in her father and mother; Stroke in her father.Erika Hurley reports that Erika Hurley has never smoked. Erika Hurley has never used smokeless tobacco. Erika Hurley reports that Erika Hurley drinks about 1.2 oz of alcohol per week. Erika Hurley reports that Erika Hurley does not use illicit drugs.  Outpatient Prescriptions Prior to Visit  Medication Sig Dispense Refill  . ALPRAZolam (XANAX) 0.25 MG tablet Take 1 tablet (0.25 mg total) by mouth at bedtime as needed for anxiety. 30 tablet 0  . cloNIDine (CATAPRES) 0.1 MG tablet Take 0.1 mg by mouth 2 (two) times daily. 1/2 2 times daily    . diclofenac (VOLTAREN) 75 MG EC tablet Take 1 tablet (75 mg total) by mouth 2 (two) times daily. 60 tablet 3  . hydrocortisone-pramoxine (ANALPRAM-HC) 2.5-1 % rectal cream Place rectally 4 (four) times daily. For irritated and painful hemorrhoids 15 g 2  . sertraline (ZOLOFT) 100 MG tablet Take 1 tablet (100 mg total) by mouth daily. Take 1.5 tabs by mouth daily 45 tablet 1  . SUMAtriptan (IMITREX) 50 MG tablet Take 50 mg by mouth every 2 (two) hours as needed for migraine or headache. May repeat in 2 hours if headache persists or recurs.     No facility-administered medications prior to visit.    ROS Review of Systems  Constitutional: Negative for fever, chills, diaphoresis and fatigue.  Respiratory:  Negative for chest tightness, shortness of breath and wheezing.   Cardiovascular: Negative for chest pain, palpitations and leg swelling.  Gastrointestinal: Negative for nausea, vomiting, diarrhea and rectal pain.  Skin: Negative for rash.  Neurological: Negative for dizziness, weakness, numbness and headaches.  Psychiatric/Behavioral: The patient is not nervous/anxious.     Objective:  BP 102/62 mmHg  Pulse 72  Temp(Src) 97.9 F (36.6 C) (Oral)  Ht 5\' 8"  (1.727 m)  Wt 141 lb (63.957 kg)  BMI 21.44 kg/m2  SpO2 97%  Physical Exam  Constitutional: Erika Hurley is oriented to person, place, and time. Erika Hurley appears well-developed and well-nourished. No distress.  HENT:  Head: Normocephalic and atraumatic.  Right Ear: External ear normal.  Left Ear: External ear normal.  Cardiovascular: Normal rate, regular rhythm, normal heart sounds and intact distal pulses.  Exam reveals no gallop and no friction rub.   No murmur heard. Pulmonary/Chest: Effort normal and breath sounds normal. No respiratory distress. Erika Hurley has no wheezes. Erika Hurley has no rales. Erika Hurley exhibits no tenderness.  Neurological: Erika Hurley is alert and oriented to person, place, and time. No cranial nerve deficit. Erika Hurley exhibits normal muscle tone. Coordination normal.  Skin: Skin is warm and dry. No rash noted. Erika Hurley is not diaphoretic.  Psychiatric: Erika Hurley has a normal mood and affect. Her behavior is normal. Judgment and thought content normal.   Assessment & Plan:   Erika Hurley was seen today for establish care.  Diagnoses and  all orders for this visit:  Iritis  Hot flashes  Thrombosed external hemorrhoids  Encounter to establish care  Screening for deficiency anemia -     CBC with Differential/Platelet  Screening for diabetes mellitus -     Comprehensive metabolic panel -     Hemoglobin A1c  Screening for thyroid disorder -     TSH  Vitamin D deficiency -     Vit D  25 hydroxy (rtn osteoporosis monitoring)  Hyperlipidemia -      Direct LDL  Need for Tdap vaccination -     Tdap vaccine greater than or equal to 7yo IM  I am having Erika Hurley maintain her hydrocortisone-pramoxine, cloNIDine, SUMAtriptan, sertraline, ALPRAZolam, diclofenac, and cyclobenzaprine.  Meds ordered this encounter  Medications  . cyclobenzaprine (FLEXERIL) 10 MG tablet    Sig: Take 10 mg by mouth 3 (three) times daily as needed for muscle spasms.     Follow-up: Return in about 3 months (around 12/16/2015) for CPE.

## 2015-09-15 NOTE — Patient Instructions (Signed)
Please visit the lab today.   Thank you for getting your Tdap.   Welcome to [facility]! Nice to meet you.

## 2015-09-21 DIAGNOSIS — Z23 Encounter for immunization: Secondary | ICD-10-CM | POA: Insufficient documentation

## 2015-09-21 DIAGNOSIS — E785 Hyperlipidemia, unspecified: Secondary | ICD-10-CM | POA: Insufficient documentation

## 2015-09-21 NOTE — Assessment & Plan Note (Signed)
Stable currently, no concerns today, imitrex helpful

## 2015-09-21 NOTE — Assessment & Plan Note (Signed)
Still seeing [facility]. Still smells smoke like smell and has occasional memory concerns. Will follow

## 2015-09-21 NOTE — Assessment & Plan Note (Signed)
Obtaining labs today for high cholesterol levels reported by pt. Will obtain Direct LDL today

## 2015-09-21 NOTE — Assessment & Plan Note (Signed)
Uncontrolled. Zoloft and clonidine supposedly helpful. Will follow

## 2015-09-21 NOTE — Assessment & Plan Note (Signed)
Stable currently, sees eye Dr. Frequently

## 2015-09-21 NOTE — Assessment & Plan Note (Signed)
Tdap given today for update.

## 2016-01-09 ENCOUNTER — Encounter: Payer: Self-pay | Admitting: Nurse Practitioner

## 2016-01-09 ENCOUNTER — Ambulatory Visit (INDEPENDENT_AMBULATORY_CARE_PROVIDER_SITE_OTHER): Payer: 59 | Admitting: Nurse Practitioner

## 2016-01-09 VITALS — BP 102/72 | HR 72 | Temp 98.3°F | Resp 14 | Ht 68.0 in | Wt 143.4 lb

## 2016-01-09 DIAGNOSIS — B370 Candidal stomatitis: Secondary | ICD-10-CM | POA: Diagnosis not present

## 2016-01-09 DIAGNOSIS — B3781 Candidal esophagitis: Secondary | ICD-10-CM

## 2016-01-09 DIAGNOSIS — E785 Hyperlipidemia, unspecified: Secondary | ICD-10-CM

## 2016-01-09 MED ORDER — MAGIC MOUTHWASH: 5.0000 mL | Freq: Four times a day (QID) | ORAL | Status: DC

## 2016-01-09 MED ORDER — ROSUVASTATIN CALCIUM 5 MG PO TABS: 5.0000 mg | ORAL_TABLET | Freq: Every day | ORAL | Status: DC

## 2016-01-09 NOTE — Assessment & Plan Note (Signed)
Starting on Rosuvastatin at night for elevated LDL (work had 202 results recently).   Fish oil encouraged  FU for labs LFTs and BMET in 1 month

## 2016-01-09 NOTE — Patient Instructions (Addendum)
1 Capsule of the Fish oil + Rosuvastatin at night   Follow up in 1 month w/ labs  Swish and swallow up to 4 times a day as instructed

## 2016-01-09 NOTE — Progress Notes (Signed)
Patient ID: Erika Hurley, female    DOB: 02-05-1966  Age: 50 y.o. MRN: [id number]  CC: Hyperlipidemia and Thrush   HPI Erika Hurley presents for CC of hyperlipidemia and thrush.  1) Pt reports 5 months of filmy white on tongue Hard to swallow Biotein mouth spray for dry mouth  Non-tender  2) HLD-  Tried red yeast rice  No cardiology follow up in years  Holter monitor in past Episodes of Tachycardia  1 cup of mountain dew daily  Denies pre-syncope or syncopal episodes Father had MI   History Erika Hurley has a past medical history of Arthritis; Migraines; and Stroke ([place]).   She has past surgical history that includes spinal tap (2012) and leap (2004).   Her family history includes Cancer in her maternal grandfather and mother; Heart disease in her father; Hyperlipidemia in her father and mother; Hypertension in her father and mother; Kidney disease in her father and mother; Stroke in her father.She reports that she has never smoked. She has never used smokeless tobacco. She reports that she drinks about 1.2 oz of alcohol per week. She reports that she does not use illicit drugs.  Outpatient Prescriptions Prior to Visit  Medication Sig Dispense Refill  . ALPRAZolam (XANAX) 0.25 MG tablet Take 1 tablet (0.25 mg total) by mouth at bedtime as needed for anxiety. 30 tablet 0  . cloNIDine (CATAPRES) 0.1 MG tablet Take 0.1 mg by mouth 2 (two) times daily. 1/2 2 times daily    . cyclobenzaprine (FLEXERIL) 10 MG tablet Take 10 mg by mouth 3 (three) times daily as needed for muscle spasms.    . diclofenac (VOLTAREN) 75 MG EC tablet Take 1 tablet (75 mg total) by mouth 2 (two) times daily. 60 tablet 3  . hydrocortisone-pramoxine (ANALPRAM-HC) 2.5-1 % rectal cream Place rectally 4 (four) times daily. For irritated and painful hemorrhoids 15 g 2  . sertraline (ZOLOFT) 100 MG tablet Take 1 tablet (100 mg total) by mouth daily. Take 1.5 tabs by mouth daily 45 tablet 1  . SUMAtriptan  (IMITREX) 50 MG tablet Take 50 mg by mouth every 2 (two) hours as needed for migraine or headache. May repeat in 2 hours if headache persists or recurs.     No facility-administered medications prior to visit.    ROS Review of Systems  Constitutional: Negative for fever, chills, diaphoresis and fatigue.  HENT: Positive for sore throat. Negative for trouble swallowing.   Respiratory: Negative for chest tightness, shortness of breath and wheezing.   Cardiovascular: Negative for chest pain, palpitations and leg swelling.  Gastrointestinal: Negative for nausea, vomiting and diarrhea.  Skin: Negative for rash.  Neurological: Negative for dizziness, weakness, numbness and headaches.  Psychiatric/Behavioral: The patient is not nervous/anxious.     Objective:  BP 102/72 mmHg  Pulse 72  Temp(Src) 98.3 F (36.8 C) (Oral)  Resp 14  Ht 5\' 8"  (1.727 m)  Wt 143 lb 6.4 oz (65.046 kg)  BMI 21.81 kg/m2  SpO2 98%  Physical Exam  Constitutional: She is oriented to person, place, and time. She appears well-developed and well-nourished. No distress.  HENT:  Head: Normocephalic and atraumatic.  Right Ear: External ear normal.  Left Ear: External ear normal.  Mouth/Throat: Oropharynx is clear and moist. No oropharyngeal exudate.  Tongue thick white coating  Eyes: EOM are normal. Pupils are equal, round, and reactive to light. Right eye exhibits no discharge. Left eye exhibits no discharge. No scleral icterus.  Cardiovascular: Normal rate, regular rhythm  and normal heart sounds.  Exam reveals no gallop and no friction rub.   No murmur heard. Pulmonary/Chest: Effort normal and breath sounds normal. No respiratory distress. She has no wheezes. She has no rales. She exhibits no tenderness.  Neurological: She is alert and oriented to person, place, and time. No cranial nerve deficit. She exhibits normal muscle tone. Coordination normal.  Skin: Skin is warm and dry. No rash noted. She is not diaphoretic.   Psychiatric: She has a normal mood and affect. Her behavior is normal. Judgment and thought content normal.   Assessment & Plan:   Erika Hurley was seen today for hyperlipidemia and thrush.  Diagnoses and all orders for this visit:  Hyperlipidemia  Thrush of mouth and esophagus ([place])  Other orders -     magic mouthwash SOLN; Take 5 mLs by mouth 4 (four) times daily. -     rosuvastatin (CRESTOR) 5 MG tablet; Take 1 tablet (5 mg total) by mouth daily.  I am having Erika Hurley start on magic mouthwash and rosuvastatin. I am also having her maintain her hydrocortisone-pramoxine, cloNIDine, SUMAtriptan, sertraline, ALPRAZolam, diclofenac, cyclobenzaprine, and hydrocortisone.  Meds ordered this encounter  Medications  . hydrocortisone (PROCTOSOL HC) 2.5 % rectal cream    Sig:   . magic mouthwash SOLN    Sig: Take 5 mLs by mouth 4 (four) times daily.    Dispense:  120 mL    Refill:  0    With nystatin, diphenhydramine HCL, Alum & Mag 1:1 ratio    Order Specific Question:  Supervising Provider    Answer:  Deborra Medina L [[id number]]  . rosuvastatin (CRESTOR) 5 MG tablet    Sig: Take 1 tablet (5 mg total) by mouth daily.    Dispense:  30 tablet    Refill:  0    Order Specific Question:  Supervising Provider    Answer:  Crecencio Mc [[id number]]     Follow-up: Return in about 4 weeks (around 02/06/2016) for Follow up with labs.

## 2016-01-09 NOTE — Assessment & Plan Note (Signed)
Magic mouth wash printed and sent with pt to pharmacy New onset Non-smoker No findings of immunocompromised state Will follow

## 2016-01-10 ENCOUNTER — Telehealth: Payer: Self-pay

## 2016-01-10 NOTE — Telephone Encounter (Signed)
Pharmacy had questions about magic mouthwash, they stated that they were going to use the version they have on hand.

## 2016-02-10 ENCOUNTER — Ambulatory Visit: Payer: 59 | Admitting: Nurse Practitioner

## 2016-02-16 ENCOUNTER — Ambulatory Visit (INDEPENDENT_AMBULATORY_CARE_PROVIDER_SITE_OTHER): Payer: 59 | Admitting: Nurse Practitioner

## 2016-02-16 ENCOUNTER — Encounter: Payer: Self-pay | Admitting: Nurse Practitioner

## 2016-02-16 VITALS — BP 104/68 | HR 73 | Temp 98.7°F | Resp 12 | Ht 68.0 in | Wt 142.8 lb

## 2016-02-16 DIAGNOSIS — E785 Hyperlipidemia, unspecified: Secondary | ICD-10-CM | POA: Diagnosis not present

## 2016-02-16 DIAGNOSIS — B370 Candidal stomatitis: Secondary | ICD-10-CM

## 2016-02-16 DIAGNOSIS — B3781 Candidal esophagitis: Secondary | ICD-10-CM

## 2016-02-16 MED ORDER — FLUCONAZOLE 150 MG PO TABS: 150.0000 mg | ORAL_TABLET | Freq: Once | ORAL | Status: DC

## 2016-02-16 MED ORDER — ROSUVASTATIN CALCIUM 5 MG PO TABS: 5.0000 mg | ORAL_TABLET | Freq: Every day | ORAL | Status: DC

## 2016-02-16 NOTE — Progress Notes (Signed)
Patient ID: Erika Hurley, female    DOB: [birth year]  Age: 50 y.o. MRN: [id number]  CC: Follow-up   HPI Erika Hurley presents for follow-up of recurring thrush and hyperlipidemia.  1) patient reports another month of filmy white stuff on tongue, difficulty with swallowing, has tried Biotene over-the-counter spray for mouth dryness Last visit she was given Magic mouthwash and she is completed this and felt slightly better but then symptoms returned quickly afterwards.   2) patient was started on rosuvastatin at night for elevated LDL, she has been taking consistently and is here today for labs, she has been taking fish oil as well  History Erika Hurley has a past medical history of Arthritis; Migraines; and Stroke ([place]).   She has past surgical history that includes spinal tap (2012) and leap (2004).   Her family history includes Cancer in her maternal grandfather and mother; Heart disease in her father; Hyperlipidemia in her father and mother; Hypertension in her father and mother; Kidney disease in her father and mother; Stroke in her father.She reports that she has never smoked. She has never used smokeless tobacco. She reports that she drinks about 1.2 oz of alcohol per week. She reports that she does not use illicit drugs.  Outpatient Prescriptions Prior to Visit  Medication Sig Dispense Refill  . ALPRAZolam (XANAX) 0.25 MG tablet Take 1 tablet (0.25 mg total) by mouth at bedtime as needed for anxiety. 30 tablet 0  . cloNIDine (CATAPRES) 0.1 MG tablet Take 0.1 mg by mouth 2 (two) times daily. 1/2 2 times daily    . cyclobenzaprine (FLEXERIL) 10 MG tablet Take 10 mg by mouth 3 (three) times daily as needed for muscle spasms.    . diclofenac (VOLTAREN) 75 MG EC tablet Take 1 tablet (75 mg total) by mouth 2 (two) times daily. 60 tablet 3  . hydrocortisone (PROCTOSOL HC) 2.5 % rectal cream     . hydrocortisone-pramoxine (ANALPRAM-HC) 2.5-1 % rectal cream Place rectally 4 (four) times  daily. For irritated and painful hemorrhoids 15 g 2  . sertraline (ZOLOFT) 100 MG tablet Take 1 tablet (100 mg total) by mouth daily. Take 1.5 tabs by mouth daily 45 tablet 1  . SUMAtriptan (IMITREX) 50 MG tablet Take 50 mg by mouth every 2 (two) hours as needed for migraine or headache. May repeat in 2 hours if headache persists or recurs.    . rosuvastatin (CRESTOR) 5 MG tablet Take 1 tablet (5 mg total) by mouth daily. 30 tablet 0  . magic mouthwash SOLN Take 5 mLs by mouth 4 (four) times daily. 120 mL 0   No facility-administered medications prior to visit.    ROS Review of Systems  Constitutional: Negative for fever, chills, diaphoresis and fatigue.  Respiratory: Negative for chest tightness, shortness of breath and wheezing.   Cardiovascular: Negative for chest pain, palpitations and leg swelling.  Gastrointestinal: Negative for nausea, vomiting and diarrhea.  Skin: Negative for rash.  Neurological: Negative for dizziness, weakness, numbness and headaches.  Psychiatric/Behavioral: The patient is not nervous/anxious.     Objective:  BP 104/68 mmHg  Pulse 73  Temp(Src) 98.7 F (37.1 C) (Oral)  Resp 12  Ht 5\' 8"  (1.727 m)  Wt 142 lb 12.8 oz (64.774 kg)  BMI 21.72 kg/m2  SpO2 97%  Physical Exam  Constitutional: She is oriented to person, place, and time. She appears well-developed and well-nourished. No distress.  HENT:  Head: Normocephalic and atraumatic.  Right Ear: External ear normal.  Left  Ear: External ear normal.  Mouth/Throat: Oropharynx is clear and moist. No oropharyngeal exudate.  Thick white coating of tongue  Cardiovascular: Normal rate and regular rhythm.   Pulmonary/Chest: Effort normal and breath sounds normal. No respiratory distress. She has no wheezes. She has no rales. She exhibits no tenderness.  Neurological: She is alert and oriented to person, place, and time. No cranial nerve deficit. She exhibits normal muscle tone. Coordination normal.  Skin:  Skin is warm and dry. No rash noted. She is not diaphoretic.  Psychiatric: She has a normal mood and affect. Her behavior is normal. Judgment and thought content normal.   Assessment & Plan:   Erika Hurley was seen today for follow-up.  Diagnoses and all orders for this visit:  Hyperlipidemia -     Cancel: Hepatic function panel -     Cancel: Basic Metabolic Panel (BMET) -     Hepatic function panel; Future -     Basic Metabolic Panel (BMET); Future  Thrush of mouth and esophagus ([place])  Other orders -     rosuvastatin (CRESTOR) 5 MG tablet; Take 1 tablet (5 mg total) by mouth daily. -     fluconazole (DIFLUCAN) 150 MG tablet; Take 1 tablet (150 mg total) by mouth once.   I have discontinued Erika Hurley's magic mouthwash. I am also having her start on fluconazole. Additionally, I am having her maintain her hydrocortisone-pramoxine, cloNIDine, SUMAtriptan, sertraline, ALPRAZolam, diclofenac, cyclobenzaprine, hydrocortisone, and rosuvastatin.  Meds ordered this encounter  Medications  . rosuvastatin (CRESTOR) 5 MG tablet    Sig: Take 1 tablet (5 mg total) by mouth daily.    Dispense:  90 tablet    Refill:  0    Order Specific Question:  Supervising Provider    Answer:  Deborra Medina L [[id number]]  . fluconazole (DIFLUCAN) 150 MG tablet    Sig: Take 1 tablet (150 mg total) by mouth once.    Dispense:  3 tablet    Refill:  0    Order Specific Question:  Supervising Provider    Answer:  Crecencio Mc [[id number]]     Follow-up: Return in about 1 week (around 02/23/2016) for Follow up Lab visit.

## 2016-02-16 NOTE — Progress Notes (Signed)
Pre visit review using our clinic review tool, if applicable. No additional management support is needed unless otherwise documented below in the visit note. 

## 2016-02-16 NOTE — Patient Instructions (Addendum)
Diflucan- try 1 then another 1 3 days later (if not better take the 3rd pill 3 days later).   Please make a fasting lab appointment for next week.   Start back on the rosuvastatin at night.

## 2016-02-23 ENCOUNTER — Telehealth: Payer: Self-pay | Admitting: *Deleted

## 2016-02-23 ENCOUNTER — Other Ambulatory Visit (INDEPENDENT_AMBULATORY_CARE_PROVIDER_SITE_OTHER): Payer: 59

## 2016-02-23 DIAGNOSIS — E785 Hyperlipidemia, unspecified: Secondary | ICD-10-CM

## 2016-02-23 LAB — BASIC METABOLIC PANEL
BUN: 13 mg/dL (ref 6–23)
CALCIUM: 9.6 mg/dL (ref 8.4–10.5)
CO2: 30 meq/L (ref 19–32)
Chloride: 102 mEq/L (ref 96–112)
Creatinine, Ser: 0.82 mg/dL (ref 0.40–1.20)
GFR: 77.84 mL/min (ref >60.00)
GLUCOSE: 101 mg/dL — AB (ref 70–99)
Potassium: 4.5 mEq/L (ref 3.5–5.1)
Sodium: 139 mEq/L (ref 135–145)

## 2016-02-23 LAB — LIPID PANEL
CHOLESTEROL: 172 mg/dL (ref 0–200)
HDL: 55.5 mg/dL (ref >39.00)
LDL CALC: 100 mg/dL — AB (ref 0–99)
NonHDL: 116.3
TRIGLYCERIDES: 83 mg/dL (ref 0.0–149.0)
Total CHOL/HDL Ratio: 3
VLDL: 16.6 mg/dL (ref 0.0–40.0)

## 2016-02-23 LAB — HEPATIC FUNCTION PANEL
ALK PHOS: 87 U/L (ref 39–117)
ALT: 21 U/L (ref 0–35)
AST: 24 U/L (ref 0–37)
Albumin: 4.4 g/dL (ref 3.5–5.2)
BILIRUBIN DIRECT: 0.1 mg/dL (ref 0.0–0.3)
BILIRUBIN TOTAL: 0.4 mg/dL (ref 0.2–1.2)
Total Protein: 7.4 g/dL (ref 6.0–8.3)

## 2016-02-23 NOTE — Addendum Note (Signed)
Addended by: Karlene Einstein D on: 02/23/2016 09:05 AM   Modules accepted: Orders

## 2016-02-23 NOTE — Telephone Encounter (Signed)
Okay to do so. Dx screen for hyperlipidemia (or hyperlipidemia if already on past history list)

## 2016-02-23 NOTE — Telephone Encounter (Signed)
Pt wants to add lipid panel ?

## 2016-02-24 ENCOUNTER — Encounter: Payer: Self-pay | Admitting: Nurse Practitioner

## 2016-02-26 NOTE — Assessment & Plan Note (Signed)
Patient is willing to try Diflucan 1 tablet every 72 hours 3. Follow-up via my chart

## 2016-02-26 NOTE — Assessment & Plan Note (Signed)
Be met in hepatic function panel were checked today Patient requested add-on of lipid panel

## 2016-03-05 ENCOUNTER — Other Ambulatory Visit: Payer: Self-pay | Admitting: Nurse Practitioner

## 2016-03-05 MED ORDER — FLUCONAZOLE 150 MG PO TABS
150.0000 mg | ORAL_TABLET | Freq: Once | ORAL | Status: DC
Start: 2016-03-05 — End: 2020-03-01

## 2016-03-13 ENCOUNTER — Other Ambulatory Visit: Payer: Self-pay | Admitting: Nurse Practitioner

## 2016-03-13 DIAGNOSIS — K143 Hypertrophy of tongue papillae: Secondary | ICD-10-CM

## 2016-03-20 ENCOUNTER — Encounter: Payer: Self-pay | Admitting: Nurse Practitioner

## 2016-04-23 ENCOUNTER — Telehealth: Payer: Self-pay | Admitting: Nurse Practitioner

## 2016-04-23 MED ORDER — ROSUVASTATIN CALCIUM 5 MG PO TABS: 5.0000 mg | ORAL_TABLET | Freq: Every day | ORAL | Status: DC

## 2016-04-23 NOTE — Telephone Encounter (Signed)
Pt called about needing a refill for rosuvastatin (CRESTOR) 5 MG tablet.   Pharmacy is [facility] [place], [place].  Call pt @ [phone] Thank you!

## 2016-04-23 NOTE — Telephone Encounter (Signed)
refilled 

## 2016-05-14 DIAGNOSIS — F40243 Fear of flying: Secondary | ICD-10-CM | POA: Insufficient documentation

## 2016-05-14 DIAGNOSIS — A6000 Herpesviral infection of urogenital system, unspecified: Secondary | ICD-10-CM | POA: Insufficient documentation

## 2016-09-18 LAB — HM MAMMOGRAPHY

## 2017-05-10 LAB — HM HEPATITIS C SCREENING LAB: HM Hepatitis Screen: NEGATIVE

## 2017-10-03 ENCOUNTER — Encounter: Payer: Self-pay | Admitting: Obstetrics & Gynecology

## 2017-10-03 ENCOUNTER — Ambulatory Visit (INDEPENDENT_AMBULATORY_CARE_PROVIDER_SITE_OTHER): Payer: 59 | Admitting: Obstetrics & Gynecology

## 2017-10-03 VITALS — BP 120/80 | Ht 69.0 in | Wt 135.0 lb

## 2017-10-03 DIAGNOSIS — Z1211 Encounter for screening for malignant neoplasm of colon: Secondary | ICD-10-CM

## 2017-10-03 DIAGNOSIS — Z0000 Encounter for general adult medical examination without abnormal findings: Secondary | ICD-10-CM | POA: Diagnosis not present

## 2017-10-03 DIAGNOSIS — Z1231 Encounter for screening mammogram for malignant neoplasm of breast: Secondary | ICD-10-CM

## 2017-10-03 DIAGNOSIS — Z87410 Personal history of cervical dysplasia: Secondary | ICD-10-CM | POA: Insufficient documentation

## 2017-10-03 DIAGNOSIS — Z1239 Encounter for other screening for malignant neoplasm of breast: Secondary | ICD-10-CM

## 2017-10-03 MED ORDER — CLONIDINE HCL 0.1 MG PO TABS: 0.0500 mg | ORAL_TABLET | Freq: Two times a day (BID) | ORAL | 11 refills | Status: DC

## 2017-10-03 MED ORDER — ACYCLOVIR 200 MG PO CAPS: ORAL_CAPSULE | ORAL | 11 refills | Status: DC

## 2017-10-03 NOTE — Patient Instructions (Signed)
PAP every year Mammogram every year    Call [phone] to schedule at [place] Colonoscopy every 10 years Labs yearly (with PCP)   

## 2017-10-03 NOTE — Progress Notes (Signed)
HPI:      [name] is a 51 y.o. G1P1001 who LMP was in the past, she presents today for her annual examination.  The patient has no complaints today. The patient is sexually active. Herlast pap: approximate date 2016 and was normal and last mammogram: approximate date 2017 and was normal.  The patient does perform self breast exams.  There is no notable family history of breast or ovarian cancer in her family. The patient is not taking hormone replacement therapy. Patient denies post-menopausal vaginal bleeding.   The patient has regular exercise: yes. The patient denies current symptoms of depression.   Prior LEEP, has PAP regularly done here. Mild vasomotor sx's for which Clonidine helps.  GYN Hx: Last Colonoscopy:3 years ago. Normal.  Last DEXA: never ago.    PMHx: Past Medical History:  Diagnosis Date  . Arthritis   . Cervical dysplasia   . History of abnormal cervical Pap smear    HGSIL  . Migraines   . Solitary cyst of breast   . Stroke [facility])    Past Surgical History:  Procedure Laterality Date  . LEEP  2004  . spinal tap  2012   Family History  Problem Relation Age of Onset  . Cancer Mother   . Hypertension Mother   . Hyperlipidemia Mother   . Kidney disease Mother   . Stroke Father   . Heart disease Father   . Hypertension Father   . Hyperlipidemia Father   . Kidney disease Father   . Cancer Maternal Grandfather    Social History   Tobacco Use  . Smoking status: Never Smoker  . Smokeless tobacco: Never Used  Substance Use Topics  . Alcohol use: Yes    Alcohol/week: 1.2 oz    Types: 2 Glasses of wine per week  . Drug use: No    Current Outpatient Medications:  .  cloNIDine (CATAPRES) 0.1 MG tablet, Take 0.5 tablets (0.05 mg total) by mouth 2 (two) times daily., Disp: 30 tablet, Rfl: 11 .  cyclobenzaprine (FLEXERIL) 10 MG tablet, Take 10 mg by mouth 3 (three) times daily as needed for muscle spasms., Disp: , Rfl:  .  diclofenac (VOLTAREN) 75  MG EC tablet, Take 1 tablet (75 mg total) by mouth 2 (two) times daily., Disp: 60 tablet, Rfl: 3 .  hydrocortisone-pramoxine (ANALPRAM-HC) 2.5-1 % rectal cream, Place rectally 4 (four) times daily. For irritated and painful hemorrhoids, Disp: 15 g, Rfl: 2 .  rosuvastatin (CRESTOR) 5 MG tablet, Take 1 tablet (5 mg total) by mouth daily., Disp: 90 tablet, Rfl: 2 .  SUMAtriptan (IMITREX) 50 MG tablet, Take 50 mg by mouth every 2 (two) hours as needed for migraine or headache. May repeat in 2 hours if headache persists or recurs., Disp: , Rfl:  .  acyclovir (ZOVIRAX) 200 MG capsule, acyclovir 200 mg capsule daily for suppression, Disp: 30 capsule, Rfl: 11 .  ALPRAZolam (XANAX) 0.25 MG tablet, Take 1 tablet (0.25 mg total) by mouth at bedtime as needed for anxiety. (Patient not taking: Reported on 10/03/2017), Disp: 30 tablet, Rfl: 0 .  fluconazole (DIFLUCAN) 150 MG tablet, Take 1 tablet (150 mg total) by mouth once. (Patient not taking: Reported on 10/03/2017), Disp: 3 tablet, Rfl: 0 .  hydrocortisone (PROCTOSOL HC) 2.5 % rectal cream, , Disp: , Rfl:  .  Melatonin 3 MG TABS, Take by mouth., Disp: , Rfl:  .  sertraline (ZOLOFT) 100 MG tablet, Take 1 tablet (100 mg total) by  mouth daily. Take 1.5 tabs by mouth daily (Patient not taking: Reported on 10/03/2017), Disp: 45 tablet, Rfl: 1 Allergies: Sulfa antibiotics  Review of Systems  Constitutional: Negative for chills, fever and malaise/fatigue.  HENT: Negative for congestion, sinus pain and sore throat.   Eyes: Negative for blurred vision and pain.  Respiratory: Negative for cough and wheezing.   Cardiovascular: Negative for chest pain and leg swelling.  Gastrointestinal: Negative for abdominal pain, constipation, diarrhea, heartburn, nausea and vomiting.  Genitourinary: Negative for dysuria, frequency, hematuria and urgency.  Musculoskeletal: Negative for back pain, joint pain, myalgias and neck pain.  Skin: Negative for itching and rash.    Neurological: Negative for dizziness, tremors and weakness.  Endo/Heme/Allergies: Does not bruise/bleed easily.  Psychiatric/Behavioral: Negative for depression. The patient is not nervous/anxious and does not have insomnia.     Objective: BP 120/80   Ht 5\' 9"  (1.753 m)   Wt 135 lb (61.2 kg)   BMI 19.94 kg/m   Filed Weights   10/03/17 1523  Weight: 135 lb (61.2 kg)   Body mass index is 19.94 kg/m. Physical Exam  Constitutional: She is oriented to person, place, and time. She appears well-developed and well-nourished. No distress.  Genitourinary: Rectum normal, vagina normal and uterus normal. Pelvic exam was performed with patient supine. There is no rash or lesion on the right labia. There is no rash or lesion on the left labia. Vagina exhibits no lesion. No bleeding in the vagina. Right adnexum does not display mass and does not display tenderness. Left adnexum does not display mass and does not display tenderness. Cervix does not exhibit motion tenderness, lesion, friability or polyp.   Uterus is mobile and midaxial. Uterus is not enlarged or exhibiting a mass.  HENT:  Head: Normocephalic and atraumatic. Head is without laceration.  Right Ear: Hearing normal.  Left Ear: Hearing normal.  Nose: No epistaxis.  No foreign bodies.  Mouth/Throat: Uvula is midline, oropharynx is clear and moist and mucous membranes are normal.  Eyes: Pupils are equal, round, and reactive to light.  Neck: Normal range of motion. Neck supple. No thyromegaly present.  Cardiovascular: Normal rate and regular rhythm. Exam reveals no gallop and no friction rub.  No murmur heard. Pulmonary/Chest: Effort normal and breath sounds normal. No respiratory distress. She has no wheezes. Right breast exhibits no mass, no skin change and no tenderness. Left breast exhibits no mass, no skin change and no tenderness.  Abdominal: Soft. Bowel sounds are normal. She exhibits no distension. There is no tenderness. There is  no rebound.  Musculoskeletal: Normal range of motion.  Neurological: She is alert and oriented to person, place, and time. No cranial nerve deficit.  Skin: Skin is warm and dry.  Psychiatric: She has a normal mood and affect. Judgment normal.  Vitals reviewed.  Assessment: Annual Exam 1. Annual physical exam   2. History of cervical dysplasia   3. Screening for breast cancer   4. Screen for colon cancer    Plan:            1.  Cervical Screening-  Pap smear done today  2. Breast screening- Exam annually and mammogram scheduled  3. Colonoscopy every 10 years, Hemoccult testing after age 35  4. Labs managed by PCP  5. Counseling for hormonal therapy: none, no change in therapy today  6. Refill for acyclovir and clonidine    F/U  Return in about 1 year (around 10/03/2018) for Annual.  Barnett Applebaum, MD, [facility]  [facility], [facility] 10/03/2017  4:03 PM

## 2017-10-04 ENCOUNTER — Ambulatory Visit
Admission: RE | Admit: 2017-10-04 | Discharge: 2017-10-04 | Disposition: A | Discharge status: Home / Self Care | Payer: 59 | Source: Ambulatory Visit | Attending: Obstetrics & Gynecology | Admitting: Obstetrics & Gynecology

## 2017-10-04 DIAGNOSIS — Z1231 Encounter for screening mammogram for malignant neoplasm of breast: Secondary | ICD-10-CM | POA: Diagnosis present

## 2017-10-04 DIAGNOSIS — Z1239 Encounter for other screening for malignant neoplasm of breast: Secondary | ICD-10-CM

## 2017-10-04 LAB — PAP IG (IMAGE GUIDED): PAP SMEAR COMMENT: 0

## 2017-10-09 ENCOUNTER — Inpatient Hospital Stay
Admission: RE | Admit: 2017-10-09 | Discharge: 2017-10-09 | Disposition: A | Discharge status: Home / Self Care | Payer: Self-pay | Source: Ambulatory Visit | Attending: *Deleted | Admitting: *Deleted

## 2017-10-09 ENCOUNTER — Other Ambulatory Visit: Payer: Self-pay | Admitting: *Deleted

## 2017-10-09 DIAGNOSIS — Z9289 Personal history of other medical treatment: Secondary | ICD-10-CM

## 2017-10-18 LAB — FECAL OCCULT BLOOD, IMMUNOCHEMICAL: FECAL OCCULT BLD: NEGATIVE

## 2017-10-18 LAB — SPECIMEN STATUS REPORT

## 2018-01-10 ENCOUNTER — Other Ambulatory Visit: Payer: Self-pay

## 2018-01-10 ENCOUNTER — Emergency Department: Payer: 59

## 2018-01-10 ENCOUNTER — Emergency Department
Admission: EM | Admit: 2018-01-10 | Discharge: 2018-01-10 | Disposition: A | Discharge status: Home / Self Care | Payer: 59 | Attending: Emergency Medicine | Admitting: Emergency Medicine

## 2018-01-10 DIAGNOSIS — R0789 Other chest pain: Secondary | ICD-10-CM | POA: Diagnosis present

## 2018-01-10 DIAGNOSIS — Z8673 Personal history of transient ischemic attack (TIA), and cerebral infarction without residual deficits: Secondary | ICD-10-CM | POA: Diagnosis not present

## 2018-01-10 DIAGNOSIS — R079 Chest pain, unspecified: Secondary | ICD-10-CM

## 2018-01-10 DIAGNOSIS — Z79899 Other long term (current) drug therapy: Secondary | ICD-10-CM | POA: Diagnosis not present

## 2018-01-10 LAB — BASIC METABOLIC PANEL
Anion gap: 10 (ref 5–15)
BUN: 10 mg/dL (ref 6–20)
CHLORIDE: 103 mmol/L (ref 101–111)
CO2: 26 mmol/L (ref 22–32)
CREATININE: 0.99 mg/dL (ref 0.44–1.00)
Calcium: 9.1 mg/dL (ref 8.9–10.3)
GFR calc Af Amer: >60 mL/min (ref >60)
GFR calc non Af Amer: >60 mL/min (ref >60)
Glucose, Bld: 112 mg/dL — ABNORMAL HIGH (ref 65–99)
Potassium: 3.5 mmol/L (ref 3.5–5.1)
SODIUM: 139 mmol/L (ref 135–145)

## 2018-01-10 LAB — CBC
HCT: 40.6 % (ref 35.0–47.0)
Hemoglobin: 13.6 g/dL (ref 12.0–16.0)
MCH: 31.4 pg (ref 26.0–34.0)
MCHC: 33.4 g/dL (ref 32.0–36.0)
MCV: 93.9 fL (ref 80.0–100.0)
PLATELETS: 368 10*3/uL (ref 150–440)
RBC: 4.33 MIL/uL (ref 3.80–5.20)
RDW: 13.2 % (ref 11.5–14.5)
WBC: 7.1 10*3/uL (ref 3.6–11.0)

## 2018-01-10 LAB — TROPONIN I
Troponin I: <0.03 ng/mL (ref <0.03)
Troponin I: <0.03 ng/mL (ref <0.03)

## 2018-01-10 NOTE — ED Provider Notes (Signed)
[facility] Emergency Department Provider Note  ____________________________________________  Time seen: Approximately 7:28 PM  I have reviewed the triage vital signs and the nursing notes.   HISTORY  Chief Complaint Chest Pain    HPI Erika Hurley is a 52 y.o. female who complains of intermittent dull chest pain in the left upper chest starting today at 4:30 AM. Nonradiating, not exertional, not pleuritic, no aggravating or alleviating factors. Mild to moderate in severity. No shortness of breath vomiting or diaphoresis. No recent trauma. No recent illness. Never had anything like this before. Does note that she has a history of proximal tachycardia, idiopathic, not on any meds. She felt like her heart was racing more today.     Past Medical History:  Diagnosis Date  . Arthritis   . Cervical dysplasia   . History of abnormal cervical Pap smear    HGSIL  . Migraines   . Solitary cyst of breast   . Stroke [facility])      Patient Active Problem List   Diagnosis Date Noted  . History of cervical dysplasia 10/03/2017  . Thrush of mouth and esophagus ([place]) 01/09/2016  . Need for Tdap vaccination 09/21/2015  . Hyperlipidemia 09/21/2015  . Iritis 10/12/2014  . Hot flashes 09/06/2014  . History of CVA (cerebrovascular accident) 09/06/2014  . Hydrocephalus, adult 09/06/2014  . Hx of migraines 09/06/2014  . Thrombosed external hemorrhoids 03/02/2013     Past Surgical History:  Procedure Laterality Date  . BREAST CYST ASPIRATION Right 2005?      Marland Kitchen LEEP  2004  . spinal tap  2012     Prior to Admission medications   Medication Sig Start Date End Date Taking? Authorizing Provider  acyclovir (ZOVIRAX) 200 MG capsule acyclovir 200 mg capsule daily for suppression 10/03/17   Gae Dry, MD  ALPRAZolam Duanne Moron) 0.25 MG tablet Take 1 tablet (0.25 mg total) by mouth at bedtime as needed for anxiety. Patient not taking: Reported on 10/03/2017  09/08/14   Lucille Passy, MD  cloNIDine (CATAPRES) 0.1 MG tablet Take 0.5 tablets (0.05 mg total) by mouth 2 (two) times daily. 10/03/17   Gae Dry, MD  cyclobenzaprine (FLEXERIL) 10 MG tablet Take 10 mg by mouth 3 (three) times daily as needed for muscle spasms.    [provider]  diclofenac (VOLTAREN) 75 MG EC tablet Take 1 tablet (75 mg total) by mouth 2 (two) times daily. 11/01/14   Copland, Frederico Hamman, MD  fluconazole (DIFLUCAN) 150 MG tablet Take 1 tablet (150 mg total) by mouth once. Patient not taking: Reported on 10/03/2017 03/05/16   Rubbie Battiest, RN  hydrocortisone (PROCTOSOL HC) 2.5 % rectal cream  03/05/13   [provider]  hydrocortisone-pramoxine [facility]) 2.5-1 % rectal cream Place rectally 4 (four) times daily. For irritated and painful hemorrhoids 03/02/13   Autumn Messing III, MD  Melatonin 3 MG TABS Take by mouth.    [provider]  rosuvastatin (CRESTOR) 5 MG tablet Take 1 tablet (5 mg total) by mouth daily. 04/23/16   Coral Spikes, DO  sertraline (ZOLOFT) 100 MG tablet Take 1 tablet (100 mg total) by mouth daily. Take 1.5 tabs by mouth daily Patient not taking: Reported on 10/03/2017 09/08/14   Lucille Passy, MD  SUMAtriptan (IMITREX) 50 MG tablet Take 50 mg by mouth every 2 (two) hours as needed for migraine or headache. May repeat in 2 hours if headache persists or recurs.    [provider]     Allergies Sulfa antibiotics   Family History  Problem Relation Age of Onset  . Cancer Mother   . Hypertension Mother   . Hyperlipidemia Mother   . Kidney disease Mother   . Breast cancer Mother 26  . Stroke Father   . Heart disease Father   . Hypertension Father   . Hyperlipidemia Father   . Kidney disease Father   . Cancer Maternal Grandfather     Social History Social History   Tobacco Use  . Smoking status: Never Smoker  . Smokeless tobacco: Never Used  Substance Use Topics  . Alcohol use: Yes    Alcohol/week: 1.2 oz     Types: 2 Glasses of wine per week  . Drug use: No    Review of Systems  Constitutional:   No fever or chills.  ENT:   No sore throat. No rhinorrhea. Cardiovascular:   positive as above chest pain withoutsyncope. Respiratory:   No dyspnea or cough. Gastrointestinal:   Negative for abdominal pain, vomiting and diarrhea.  Musculoskeletal:   Negative for focal pain or swelling All other systems reviewed and are negative except as documented above in ROS and HPI.  ____________________________________________   PHYSICAL EXAM:  VITAL SIGNS: ED Triage Vitals  Enc Vitals Group     BP 01/10/18 1404 130/83     Pulse Rate 01/10/18 1404 92     Resp 01/10/18 1404 18     Temp 01/10/18 1404 98 F (36.7 C)     Temp Source 01/10/18 1404 Oral     SpO2 01/10/18 1404 99 %     Weight 01/10/18 1404 135 lb (61.2 kg)     Height 01/10/18 1404 5\' 9"  (1.753 m)     Head Circumference --      Peak Flow --      Pain Score 01/10/18 1409 5     Pain Loc --      Pain Edu? --      Excl. in [place]? --     Vital signs reviewed, nursing assessments reviewed.   Constitutional:   Alert and oriented. Well appearing and in no distress. Eyes:   No scleral icterus.  EOMI. [facility]   Head:   Normocephalic and atraumatic.   Nose:   No congestion/rhinnorhea.    Mouth/Throat:   MMM, no pharyngeal erythema. No peritonsillar mass.    Neck:   No meningismus. Full ROM.thyroid nonpalpable Hematological/Lymphatic/Immunilogical:   No cervical lymphadenopathy. Cardiovascular:   RRR. Symmetric bilateral radial and DP pulses.  No murmurs.  Respiratory:   Normal respiratory effort without tachypnea/retractions. Breath sounds are clear and equal bilaterally. No wheezes/rales/rhonchi. Musculoskeletal:   Normal range of motion in all extremities.  No lower extremity tenderness.  No edema. Neurologic:   Normal speech and language.  Motor grossly intact. No acute focal neurologic deficits are appreciated.     ____________________________________________    LABS (pertinent positives/negatives) (all labs ordered are listed, but only abnormal results are displayed) Labs Reviewed  BASIC METABOLIC PANEL - Abnormal; Notable for the following components:      Result Value   Glucose, Bld 112 (*)    All other components within normal limits  CBC  TROPONIN I  TROPONIN I   ____________________________________________   EKG  interpreted by me  Date: 01/10/2018  Rate: 92  Rhythm: normal sinus rhythm  QRS Axis: normal  Intervals: normal  ST/T Wave abnormalities: normal  Conduction Disutrbances: none  Narrative Interpretation: unremarkable  ____________________________________________    RADIOLOGY  Dg Chest 2 View  Result Date: 01/10/2018 CLINICAL DATA:  Chest pain for 2 days. EXAM: CHEST - 2 VIEW COMPARISON:  None. FINDINGS: The cardiomediastinal silhouette is unremarkable. There is no evidence of focal airspace disease, pulmonary edema, suspicious pulmonary nodule/mass, pleural effusion, or pneumothorax. No acute bony abnormalities are identified. IMPRESSION: No active cardiopulmonary disease. Electronically Signed   By: Margarette Canada M.D.   On: 01/10/2018 14:52    ____________________________________________   PROCEDURES Procedures  ____________________________________________    CLINICAL IMPRESSION / ASSESSMENT AND PLAN / ED COURSE  Pertinent labs & imaging results that were available during my care of the patient were reviewed by me and considered in my medical decision making (see chart for details).   patient well-appearing no acute distress, presents with nonspecific chest pain that resolved many hours ago. Vital signs are normal. EKG is normal. Chest x-ray is unremarkable, no evidence of pneumothorax or pneumonia. Labs including 2 troponins are negative.Considering the patient's symptoms, medical history, and physical examination today, I have low suspicion for  ACS, PE, TAD, pneumothorax, carditis, mediastinitis, pneumonia, CHF, or sepsis.  I suspect the patient is having intermittent proximal tachycardia such as SVT or A. fib. Currently in normal sinus rhythm. Suitable for discharge home and outpatient follow-up with cardiology for further evaluation. Patient agreeable to this plan.      ____________________________________________   FINAL CLINICAL IMPRESSION(S) / ED DIAGNOSES    Final diagnoses:  Nonspecific chest pain     ED Discharge Orders    None      Portions of this note were generated with dragon dictation software. Dictation errors may occur despite best attempts at proofreading.    Carrie Mew, MD 01/10/18 [phone]

## 2018-01-10 NOTE — ED Triage Notes (Signed)
To ER c/o chest pain that awoke her from sleep today at 0430AM that resolved, but then started again to left side of chest. Pt reporting nagging, tightness in her chest. Denies SOB, nausea, diaphoresis. Pt alert and oriented X4, active, cooperative, pt in NAD. RR even and unlabored, color WNL.

## 2018-01-14 ENCOUNTER — Telehealth: Payer: Self-pay

## 2018-01-14 NOTE — Telephone Encounter (Signed)
Spoke to patient about making ED fu appointment for [facility]  She stated she was going to see another provider and won't be needing our services

## 2018-09-18 ENCOUNTER — Other Ambulatory Visit: Payer: Self-pay | Admitting: Obstetrics & Gynecology

## 2018-09-18 DIAGNOSIS — Z1231 Encounter for screening mammogram for malignant neoplasm of breast: Secondary | ICD-10-CM

## 2018-10-20 ENCOUNTER — Ambulatory Visit (INDEPENDENT_AMBULATORY_CARE_PROVIDER_SITE_OTHER): Payer: 59 | Admitting: Obstetrics & Gynecology

## 2018-10-20 ENCOUNTER — Encounter: Payer: Self-pay | Admitting: Obstetrics & Gynecology

## 2018-10-20 ENCOUNTER — Ambulatory Visit
Admission: RE | Admit: 2018-10-20 | Discharge: 2018-10-20 | Disposition: A | Discharge status: Home / Self Care | Payer: 59 | Source: Ambulatory Visit | Attending: Obstetrics & Gynecology | Admitting: Obstetrics & Gynecology

## 2018-10-20 VITALS — BP 120/80 | Ht 69.0 in | Wt 135.0 lb

## 2018-10-20 DIAGNOSIS — Z01419 Encounter for gynecological examination (general) (routine) without abnormal findings: Secondary | ICD-10-CM

## 2018-10-20 DIAGNOSIS — Z1211 Encounter for screening for malignant neoplasm of colon: Secondary | ICD-10-CM

## 2018-10-20 DIAGNOSIS — Z1231 Encounter for screening mammogram for malignant neoplasm of breast: Secondary | ICD-10-CM | POA: Diagnosis not present

## 2018-10-20 DIAGNOSIS — Z0000 Encounter for general adult medical examination without abnormal findings: Secondary | ICD-10-CM

## 2018-10-20 MED ORDER — ACYCLOVIR 200 MG PO CAPS: ORAL_CAPSULE | ORAL | 11 refills | Status: DC

## 2018-10-20 MED ORDER — CLONIDINE HCL 0.1 MG PO TABS: 0.0500 mg | ORAL_TABLET | Freq: Two times a day (BID) | ORAL | 11 refills | Status: DC

## 2018-10-20 NOTE — Progress Notes (Signed)
HPI:      Erika Hurley is a 52 y.o. G1P1001 who LMP was in the past, she presents today for her annual examination.  The patient has no complaints today. The patient is sexually active. Herlast pap: approximate date 2018 and was normal and last mammogram: approximate date 2018 and was normal.  The patient does perform self breast exams.  There is notable family history of breast or ovarian cancer in her family. The patient is not taking hormone replacement therapy. Patient denies post-menopausal vaginal bleeding.   The patient has regular exercise: yes. The patient denies current symptoms of depression.  Occas hot flash.  No period.  GYN Hx: Last Colonoscopy:4 years ago. Normal.  Last DEXA: never ago.    PMHx: Past Medical History:  Diagnosis Date  . Arthritis   . Cervical dysplasia   . History of abnormal cervical Pap smear    HGSIL  . Migraines   . Solitary cyst of breast   . Stroke [facility])    Past Surgical History:  Procedure Laterality Date  . BREAST CYST ASPIRATION Right 2005?      Marland Kitchen LEEP  2004  . spinal tap  2012   Family History  Problem Relation Age of Onset  . Cancer Mother   . Hypertension Mother   . Hyperlipidemia Mother   . Kidney disease Mother   . Breast cancer Mother 19  . Stroke Father   . Heart disease Father   . Hypertension Father   . Hyperlipidemia Father   . Kidney disease Father   . Cancer Maternal Grandfather    Social History   Tobacco Use  . Smoking status: Never Smoker  . Smokeless tobacco: Never Used  Substance Use Topics  . Alcohol use: Yes    Alcohol/week: 2.0 standard drinks    Types: 2 Glasses of wine per week  . Drug use: No    Current Outpatient Medications:  .  acyclovir (ZOVIRAX) 200 MG capsule, acyclovir 200 mg capsule daily for suppression, Disp: 30 capsule, Rfl: 11 .  ALPRAZolam (XANAX) 0.25 MG tablet, Take 1 tablet (0.25 mg total) by mouth at bedtime as needed for anxiety., Disp: 30 tablet, Rfl: 0 .  cloNIDine  (CATAPRES) 0.1 MG tablet, Take 0.5 tablets (0.05 mg total) by mouth 2 (two) times daily., Disp: 30 tablet, Rfl: 11 .  cyclobenzaprine (FLEXERIL) 10 MG tablet, Take 10 mg by mouth 3 (three) times daily as needed for muscle spasms., Disp: , Rfl:  .  diclofenac (VOLTAREN) 75 MG EC tablet, Take 1 tablet (75 mg total) by mouth 2 (two) times daily., Disp: 60 tablet, Rfl: 3 .  fluconazole (DIFLUCAN) 150 MG tablet, Take 1 tablet (150 mg total) by mouth once., Disp: 3 tablet, Rfl: 0 .  hydrocortisone (PROCTOSOL HC) 2.5 % rectal cream, , Disp: , Rfl:  .  hydrocortisone-pramoxine (ANALPRAM-HC) 2.5-1 % rectal cream, Place rectally 4 (four) times daily. For irritated and painful hemorrhoids, Disp: 15 g, Rfl: 2 .  Melatonin 3 MG TABS, Take by mouth., Disp: , Rfl:  .  rosuvastatin (CRESTOR) 5 MG tablet, Take 1 tablet (5 mg total) by mouth daily., Disp: 90 tablet, Rfl: 2 .  sertraline (ZOLOFT) 100 MG tablet, Take 1 tablet (100 mg total) by mouth daily. Take 1.5 tabs by mouth daily, Disp: 45 tablet, Rfl: 1 .  SUMAtriptan (IMITREX) 50 MG tablet, Take 50 mg by mouth every 2 (two) hours as needed for migraine or headache. May repeat in 2 hours  if headache persists or recurs., Disp: , Rfl:  Allergies: Sulfa antibiotics  Review of Systems  Constitutional: Negative for chills, fever and malaise/fatigue.  HENT: Negative for congestion, sinus pain and sore throat.   Eyes: Negative for blurred vision and pain.  Respiratory: Negative for cough and wheezing.   Cardiovascular: Negative for chest pain and leg swelling.  Gastrointestinal: Negative for abdominal pain, constipation, diarrhea, heartburn, nausea and vomiting.  Genitourinary: Negative for dysuria, frequency, hematuria and urgency.  Musculoskeletal: Negative for back pain, joint pain, myalgias and neck pain.  Skin: Negative for itching and rash.  Neurological: Negative for dizziness, tremors and weakness.  Endo/Heme/Allergies: Does not bruise/bleed easily.    Psychiatric/Behavioral: Negative for depression. The patient is not nervous/anxious and does not have insomnia.     Objective: BP 120/80   Ht 5\' 9"  (1.753 m)   Wt 135 lb (61.2 kg)   BMI 19.94 kg/m   Filed Weights   10/20/18 1509  Weight: 135 lb (61.2 kg)   Body mass index is 19.94 kg/m. Physical Exam Constitutional:      General: She is not in acute distress.    Appearance: She is well-developed.  Genitourinary:     Pelvic exam was performed with patient supine.     Vagina, uterus and rectum normal.     No lesions in the vagina.     No vaginal bleeding.     No cervical motion tenderness, friability, lesion or polyp.     Uterus is mobile.     Uterus is not enlarged.     No uterine mass detected.    Uterus is midaxial.     No right or left adnexal mass present.     Right adnexa not tender.     Left adnexa not tender.  HENT:     Head: Normocephalic and atraumatic. No laceration.     Right Ear: Hearing normal.     Left Ear: Hearing normal.     Mouth/Throat:     Pharynx: Uvula midline.  Eyes:     Pupils: Pupils are equal, round, and reactive to light.  Neck:     Musculoskeletal: Normal range of motion and neck supple.     Thyroid: No thyromegaly.  Cardiovascular:     Rate and Rhythm: Normal rate and regular rhythm.     Heart sounds: No murmur. No friction rub. No gallop.   Pulmonary:     Effort: Pulmonary effort is normal. No respiratory distress.     Breath sounds: Normal breath sounds. No wheezing.  Chest:     Breasts:        Right: No mass, skin change or tenderness.        Left: No mass, skin change or tenderness.  Abdominal:     General: Bowel sounds are normal. There is no distension.     Palpations: Abdomen is soft.     Tenderness: There is no abdominal tenderness. There is no rebound.  Musculoskeletal: Normal range of motion.  Neurological:     Mental Status: She is alert and oriented to person, place, and time.     Cranial Nerves: No cranial nerve  deficit.  Skin:    General: Skin is warm and dry.  Psychiatric:        Judgment: Judgment normal.  Vitals signs reviewed.     Assessment: Annual Exam 1. Annual physical exam   2. Screen for colon cancer    Plan:  1.  Cervical Screening-  Pap smear schedule reviewed with patient  2. Breast screening- Exam annually and mammogram scheduled  3. Colonoscopy every 10 years, Hemoccult testing after age 77  4. Labs managed by PCP  5. Counseling for hormonal therapy: none  6. Clonidine for hot flashes  7. Acyclovir for HSV suppression     F/U  Return in about 1 year (around 10/21/2019) for Annual.  Barnett Applebaum, MD, [facility], [facility] 10/20/2018  3:34 PM

## 2018-10-20 NOTE — Patient Instructions (Signed)
PAP every three years Mammogram every year    Call [phone] to schedule at [facility] Colonoscopy every 10 years Labs yearly (with PCP) 

## 2018-11-02 LAB — FECAL OCCULT BLOOD, IMMUNOCHEMICAL: FECAL OCCULT BLD: NEGATIVE

## 2018-11-02 LAB — SPECIMEN STATUS REPORT

## 2019-01-16 ENCOUNTER — Other Ambulatory Visit: Payer: Self-pay

## 2019-01-16 MED ORDER — ACYCLOVIR 200 MG PO CAPS: ORAL_CAPSULE | ORAL | 11 refills | Status: DC

## 2019-09-28 ENCOUNTER — Other Ambulatory Visit: Payer: Self-pay | Admitting: Family Medicine

## 2019-09-28 DIAGNOSIS — Z1231 Encounter for screening mammogram for malignant neoplasm of breast: Secondary | ICD-10-CM

## 2019-10-22 ENCOUNTER — Ambulatory Visit
Admission: RE | Admit: 2019-10-22 | Discharge: 2019-10-22 | Disposition: A | Discharge status: Home / Self Care | Payer: 59 | Source: Ambulatory Visit | Attending: Family Medicine | Admitting: Family Medicine

## 2019-10-22 DIAGNOSIS — Z1231 Encounter for screening mammogram for malignant neoplasm of breast: Secondary | ICD-10-CM | POA: Insufficient documentation

## 2019-10-28 ENCOUNTER — Encounter: Payer: Self-pay | Admitting: Obstetrics & Gynecology

## 2019-10-28 ENCOUNTER — Other Ambulatory Visit (HOSPITAL_COMMUNITY)
Admission: RE | Admit: 2019-10-28 | Discharge: 2019-10-28 | Disposition: A | Discharge status: Home / Self Care | Payer: 59 | Source: Ambulatory Visit | Attending: Obstetrics & Gynecology | Admitting: Obstetrics & Gynecology

## 2019-10-28 ENCOUNTER — Ambulatory Visit (INDEPENDENT_AMBULATORY_CARE_PROVIDER_SITE_OTHER): Payer: 59 | Admitting: Obstetrics & Gynecology

## 2019-10-28 ENCOUNTER — Other Ambulatory Visit: Payer: Self-pay

## 2019-10-28 VITALS — BP 110/60 | Ht 69.0 in | Wt 138.0 lb

## 2019-10-28 DIAGNOSIS — Z124 Encounter for screening for malignant neoplasm of cervix: Secondary | ICD-10-CM | POA: Diagnosis not present

## 2019-10-28 DIAGNOSIS — Z1211 Encounter for screening for malignant neoplasm of colon: Secondary | ICD-10-CM

## 2019-10-28 DIAGNOSIS — R232 Flushing: Secondary | ICD-10-CM

## 2019-10-28 DIAGNOSIS — G43809 Other migraine, not intractable, without status migrainosus: Secondary | ICD-10-CM

## 2019-10-28 DIAGNOSIS — B009 Herpesviral infection, unspecified: Secondary | ICD-10-CM

## 2019-10-28 DIAGNOSIS — Z01419 Encounter for gynecological examination (general) (routine) without abnormal findings: Secondary | ICD-10-CM

## 2019-10-28 MED ORDER — ACYCLOVIR 200 MG PO CAPS: ORAL_CAPSULE | ORAL | 3 refills | Status: DC

## 2019-10-28 MED ORDER — SUMATRIPTAN SUCCINATE 50 MG PO TABS: 50.0000 mg | ORAL_TABLET | ORAL | 2 refills | Status: DC | PRN

## 2019-10-28 NOTE — Progress Notes (Signed)
HPI:      [name] is a 53 y.o. G1P1001 who LMP was in the past, she presents today for her annual examination.  The patient has no complaints today. The patient is not sexually active. Herlast pap: approximate date 2018 and was normal and last mammogram: approximate date 10/2019 and was normal.  The patient does perform self breast exams.  There is notable family history of breast or ovarian cancer in her family. The patient is not taking hormone replacement therapy. Patient denies post-menopausal vaginal bleeding.   The patient has regular exercise: yes. The patient denies current symptoms of depression.    Stopped Clonidine as did not help Occas hot flash  GYN Hx: Last Colonoscopy:5 years ago. Normal.  Last DEXA: never ago.    PMHx: Past Medical History:  Diagnosis Date  . Arthritis   . Cervical dysplasia   . History of abnormal cervical Pap smear    HGSIL  . Migraines   . Solitary cyst of breast   . Stroke [facility])    Past Surgical History:  Procedure Laterality Date  . BREAST CYST ASPIRATION Right 2005?      Marland Kitchen LEEP  2004  . spinal tap  2012   Family History  Problem Relation Age of Onset  . Cancer Mother   . Hypertension Mother   . Hyperlipidemia Mother   . Kidney disease Mother   . Breast cancer Mother 3       twice  . Stroke Father   . Heart disease Father   . Hypertension Father   . Hyperlipidemia Father   . Kidney disease Father   . Cancer Maternal Grandfather    Social History   Tobacco Use  . Smoking status: Never Smoker  . Smokeless tobacco: Never Used  Substance Use Topics  . Alcohol use: Yes    Alcohol/week: 2.0 standard drinks    Types: 2 Glasses of wine per week  . Drug use: No    Current Outpatient Medications:  .  acyclovir (ZOVIRAX) 200 MG capsule, acyclovir 200 mg capsule daily for suppression, Disp: 90 capsule, Rfl: 3 .  ALPRAZolam (XANAX) 0.25 MG tablet, Take 1 tablet (0.25 mg total) by mouth at bedtime as needed for  anxiety., Disp: 30 tablet, Rfl: 0 .  cyclobenzaprine (FLEXERIL) 10 MG tablet, Take 10 mg by mouth 3 (three) times daily as needed for muscle spasms., Disp: , Rfl:  .  diclofenac (VOLTAREN) 75 MG EC tablet, Take 1 tablet (75 mg total) by mouth 2 (two) times daily., Disp: 60 tablet, Rfl: 3 .  fluconazole (DIFLUCAN) 150 MG tablet, Take 1 tablet (150 mg total) by mouth once., Disp: 3 tablet, Rfl: 0 .  hydrocortisone (PROCTOSOL HC) 2.5 % rectal cream, , Disp: , Rfl:  .  hydrocortisone-pramoxine (ANALPRAM-HC) 2.5-1 % rectal cream, Place rectally 4 (four) times daily. For irritated and painful hemorrhoids, Disp: 15 g, Rfl: 2 .  Melatonin 3 MG TABS, Take by mouth., Disp: , Rfl:  .  rosuvastatin (CRESTOR) 5 MG tablet, Take 1 tablet (5 mg total) by mouth daily., Disp: 90 tablet, Rfl: 2 .  sertraline (ZOLOFT) 100 MG tablet, Take 1 tablet (100 mg total) by mouth daily. Take 1.5 tabs by mouth daily, Disp: 45 tablet, Rfl: 1 .  SUMAtriptan (IMITREX) 50 MG tablet, Take 1 tablet (50 mg total) by mouth every 2 (two) hours as needed for migraine or headache. May repeat in 2 hours if headache persists or recurs., Disp: 10 tablet,  Rfl: 2 Allergies: Sulfa antibiotics  Review of Systems  Constitutional: Negative for chills, fever and malaise/fatigue.  HENT: Negative for congestion, sinus pain and sore throat.   Eyes: Negative for blurred vision and pain.  Respiratory: Negative for cough and wheezing.   Cardiovascular: Negative for chest pain and leg swelling.  Gastrointestinal: Negative for abdominal pain, constipation, diarrhea, heartburn, nausea and vomiting.  Genitourinary: Negative for dysuria, frequency, hematuria and urgency.  Musculoskeletal: Negative for back pain, joint pain, myalgias and neck pain.  Skin: Negative for itching and rash.  Neurological: Positive for headaches. Negative for dizziness, tremors and weakness.  Endo/Heme/Allergies: Does not bruise/bleed easily.  Psychiatric/Behavioral: Negative  for depression. The patient is not nervous/anxious and does not have insomnia.     Objective: BP 110/60   Ht 5\' 9"  (1.753 m)   Wt 138 lb (62.6 kg)   BMI 20.38 kg/m   Filed Weights   10/28/19 0955  Weight: 138 lb (62.6 kg)   Body mass index is 20.38 kg/m. Physical Exam Constitutional:      General: She is not in acute distress.    Appearance: She is well-developed.  Genitourinary:     Pelvic exam was performed with patient supine.     Vagina, uterus and rectum normal.     No lesions in the vagina.     No vaginal bleeding.     No cervical motion tenderness, friability, lesion or polyp.     Uterus is mobile.     Uterus is not enlarged.     No uterine mass detected.    Uterus is midaxial.     No right or left adnexal mass present.     Right adnexa not tender.     Left adnexa not tender.  HENT:     Head: Normocephalic and atraumatic. No laceration.     Right Ear: Hearing normal.     Left Ear: Hearing normal.     Mouth/Throat:     Pharynx: Uvula midline.  Eyes:     Pupils: Pupils are equal, round, and reactive to light.  Neck:     Thyroid: No thyromegaly.  Cardiovascular:     Rate and Rhythm: Normal rate and regular rhythm.     Heart sounds: No murmur. No friction rub. No gallop.   Pulmonary:     Effort: Pulmonary effort is normal. No respiratory distress.     Breath sounds: Normal breath sounds. No wheezing.  Chest:     Breasts:        Right: No mass, skin change or tenderness.        Left: No mass, skin change or tenderness.  Abdominal:     General: Bowel sounds are normal. There is no distension.     Palpations: Abdomen is soft.     Tenderness: There is no abdominal tenderness. There is no rebound.  Musculoskeletal:        General: Normal range of motion.     Cervical back: Normal range of motion and neck supple.  Neurological:     Mental Status: She is alert and oriented to person, place, and time.     Cranial Nerves: No cranial nerve deficit.  Skin:     General: Skin is warm and dry.  Psychiatric:        Judgment: Judgment normal.  Vitals reviewed.     Assessment: Annual Exam 1. Women's annual routine gynecological examination   2. Screen for colon cancer   3. Screening for cervical cancer  4. Vasomotor flushing   5. HSV infection   6. Other migraine without status migrainosus, not intractable     Plan:            1.  Cervical Screening-  Pap smear done today  2. Breast screening- Exam annually and mammogram scheduled  3. Colonoscopy every 10 years, Hemoccult testing after age 11  4. Labs managed by PCP  5. Counseling for hormonal therapy: none              6. FRAX - FRAX score for assessing the 10 year probability for fracture calculated and discussed today.  Based on age and score today, DEXA is not currently scheduled.   7. Renewed Rx for Acyclovir and Imitrex    F/U  Return in about 1 year (around 10/27/2020) for Annual.  Barnett Applebaum, MD, [facility], [facility] 10/28/2019  10:30 AM

## 2019-10-28 NOTE — Patient Instructions (Signed)
PAP every three years now    Done today Mammogram every year    Done   Colonoscopy every 10 years    Due in 5 years    Cards today Labs next year  MERRY CHRISTMAS! Dr Kenton Kingfisher

## 2019-11-03 LAB — CYTOLOGY - PAP
Comment: NEGATIVE
Diagnosis: NEGATIVE
High risk HPV: NEGATIVE

## 2019-11-14 LAB — FECAL OCCULT BLOOD, IMMUNOCHEMICAL: Fecal Occult Bld: NEGATIVE

## 2019-11-14 LAB — SPECIMEN STATUS REPORT

## 2020-03-01 ENCOUNTER — Ambulatory Visit (INDEPENDENT_AMBULATORY_CARE_PROVIDER_SITE_OTHER): Payer: 59 | Admitting: Family Medicine

## 2020-03-01 ENCOUNTER — Encounter: Payer: Self-pay | Admitting: Family Medicine

## 2020-03-01 ENCOUNTER — Other Ambulatory Visit: Payer: Self-pay

## 2020-03-01 VITALS — BP 107/70 | HR 85 | Temp 97.7°F | Ht 69.0 in | Wt 140.8 lb

## 2020-03-01 DIAGNOSIS — Z7689 Persons encountering health services in other specified circumstances: Secondary | ICD-10-CM | POA: Diagnosis not present

## 2020-03-01 DIAGNOSIS — R000 Tachycardia, unspecified: Secondary | ICD-10-CM

## 2020-03-01 NOTE — Progress Notes (Signed)
Subjective:    Patient ID: Erika Hurley, female    DOB: Mar 10, 1966, 54 y.o.   MRN: [id number]  Erika Hurley is a 54 y.o. female presenting on 03/01/2020 for Establish Care   HPI Previous PCP was many years ago.  Records will not be requested.  Past medical, family, and surgical history reviewed w/ pt.   HEALTH MAINTENANCE:  Weight/BMI: Healthy BMI 20.79 Physical activity: Regular activity Diet: Regular diet Seatbelt: Always Sunscreen: No Mammogram: Completed 10/22/2019 PAP: Completed 10/28/2019 Colon cancer screening: Completed 12/06/2014 HIV & Hep C Screening: Completed Optometry: Regularly Dentistry: Regularly  Follows with Neurology for history of cerebral atrophy.  Follows with Dr. Casandra Doffing, MD at [facility] Neurology regularly.   Reports no acute concerns today.  IMMUNIZATIONS: Influenza: Due next season Tetanus: Completed: 05/10/2017 Shingrix: Had first vaccine and goes back 03/29/2020 for second booster with [facility] COVID: Holding off for now  Depression screen PHQ 2/9 03/01/2020  Decreased Interest 0  Down, Depressed, Hopeless 0  PHQ - 2 Score 0    Past Medical History:  Diagnosis Date  . Arthritis   . Cervical dysplasia   . History of abnormal cervical Pap smear    HGSIL  . Migraines   . Solitary cyst of breast   . Stroke [facility])    Past Surgical History:  Procedure Laterality Date  . BREAST CYST ASPIRATION Right 2005?      Marland Kitchen LEEP  2004  . spinal tap  2012   Social History   Socioeconomic History  . Marital status: Soil scientist    Spouse name: Not on file  . Number of children: Not on file  . Years of education: Not on file  . Highest education level: Not on file  Occupational History  . Not on file  Tobacco Use  . Smoking status: Never Smoker  . Smokeless tobacco: Never Used  Substance and Sexual Activity  . Alcohol use: Yes    Alcohol/week: 2.0 standard drinks    Types: 2 Glasses of wine per week  . Drug use: No  . Sexual  activity: Yes    Partners: Male    Birth control/protection: Post-menopausal  Other Topics Concern  . Not on file  Social History Narrative   Divorced    Employed Engineer, manufacturing systems    Social Determinants of Health   Financial Resource Strain:   . Difficulty of Paying Living Expenses:   Food Insecurity:   . Worried About Charity fundraiser in the Last Year:   . Arboriculturist in the Last Year:   Transportation Needs:   . Film/video editor (Medical):   Marland Kitchen Lack of Transportation (Non-Medical):   Physical Activity:   . Days of Exercise per Week:   . Minutes of Exercise per Session:   Stress:   . Feeling of Stress :   Social Connections:   . Frequency of Communication with Friends and Family:   . Frequency of Social Gatherings with Friends and Family:   . Attends Religious Services:   . Active Member of Clubs or Organizations:   . Attends Archivist Meetings:   Marland Kitchen Marital Status:   Intimate Partner Violence:   . Fear of Current or Ex-Partner:   . Emotionally Abused:   Marland Kitchen Physically Abused:   . Sexually Abused:    Family History  Problem Relation Age of Onset  . Cancer Mother   . Hypertension Mother   . Hyperlipidemia Mother   .  Kidney disease Mother   . Breast cancer Mother 67       twice  . Stroke Father   . Heart disease Father   . Hypertension Father   . Hyperlipidemia Father   . Kidney disease Father   . Cancer Maternal Grandfather    Current Outpatient Medications on File Prior to Visit  Medication Sig  . busPIRone (BUSPAR) 15 MG tablet Take 15 mg by mouth daily as needed.   . cyclobenzaprine (FLEXERIL) 10 MG tablet Take 10 mg by mouth at bedtime.   . hydrocortisone-pramoxine (ANALPRAM-HC) 2.5-1 % rectal cream Place rectally 4 (four) times daily. For irritated and painful hemorrhoids  . Melatonin 3 MG TABS Take by mouth.  . rosuvastatin (CRESTOR) 5 MG tablet Take 1 tablet (5 mg total) by mouth daily. (Patient taking differently: Take 5 mg by  mouth daily as needed. )  . SUMAtriptan (IMITREX) 50 MG tablet Take 1 tablet (50 mg total) by mouth every 2 (two) hours as needed for migraine or headache. May repeat in 2 hours if headache persists or recurs.  Marland Kitchen acyclovir (ZOVIRAX) 200 MG capsule acyclovir 200 mg capsule daily for suppression (Patient not taking: Reported on 03/01/2020)  . diclofenac (VOLTAREN) 75 MG EC tablet Take 1 tablet (75 mg total) by mouth 2 (two) times daily. (Patient not taking: Reported on 03/01/2020)   No current facility-administered medications on file prior to visit.    Per HPI unless specifically indicated above     Objective:    BP 107/70 (BP Location: Left Arm, Patient Position: Sitting, Cuff Size: Normal)   Pulse 85   Temp 97.7 F (36.5 C) (Temporal)   Ht 5' 9" (1.753 m)   Wt 140 lb 12.8 oz (63.9 kg)   SpO2 97%   BMI 20.79 kg/m   Wt Readings from Last 3 Encounters:  03/01/20 140 lb 12.8 oz (63.9 kg)  10/28/19 138 lb (62.6 kg)  10/20/18 135 lb (61.2 kg)   Physical Exam  Results for orders placed or performed in visit on 03/01/20  HM HEPATITIS C SCREENING LAB  Result Value Ref Range   HM Hepatitis Screen Negative-Validated       Assessment & Plan:   Problem List Items Addressed This Visit      Other   Tachycardia - Primary    Reviewed patient's visit with [facility] from 2019 when she had met with Cardiology.  Reports has worn a holter monitor for 48 hours but did not get report or have a follow up visit with provider.  Reviewed Holter Monitor report with patient, showing frequent PVCs and PACs and MD recommendation that may benefit from beta blockade medication.  Discussed with patient will refer back to Cardiology for re-evaluation as has been 2 years, to see if any additional testing is needed, and is not currently symptomatic.  Plan: 1. Referral sent to Cardiology      Relevant Orders   Ambulatory referral to Cardiology   Encounter to establish care with new doctor     Establishing as new patient at [facility] 03/01/2020.  No acute concerns.  Up to date on all preventative health screenings.  Will request copies of her labs from [facility].           No orders of the defined types were placed in this encounter.     Follow up plan: Return in about 4 weeks (around 03/29/2020) for Follow up on Tachycardia.  Harlin Rain, FNP-C Family Nurse Practitioner [facility]  [facility] 03/01/2020, 2:23 PM

## 2020-03-01 NOTE — Patient Instructions (Addendum)
We will request copies of your lab work from [facility] with [facility]  I have put in a referral to cardiology for establishment of care for your history of tachycardia with PVCs and PACs.  We will plan to see you back in 1 month, if you have not scheduled an appointment with cardiology, for follow up on your tachycardia.  You will receive a survey after today's visit either digitally by e-mail or paper by [employer]. Your experiences and feedback matter to [place].  Please respond so we know how we are doing as we provide care for you.  Call us with any questions/concerns/needs.  It is my goal to be available to you for your health concerns.  Thanks for choosing me to be a partner in your healthcare needs!  Harlin Rain, FNP-C Family Nurse Practitioner [facility] Phone: [phone]

## 2020-03-01 NOTE — Assessment & Plan Note (Signed)
Reviewed patient's visit with [facility] from 2019 when she had met with Cardiology.  Reports has worn a holter monitor for 48 hours but did not get report or have a follow up visit with provider.  Reviewed Holter Monitor report with patient, showing frequent PVCs and PACs and MD recommendation that may benefit from beta blockade medication.  Discussed with patient will refer back to Cardiology for re-evaluation as has been 2 years, to see if any additional testing is needed, and is not currently symptomatic.  Plan: 1. Referral sent to Cardiology

## 2020-03-01 NOTE — Assessment & Plan Note (Signed)
Establishing as new patient at [facility] 03/01/2020.  No acute concerns.  Up to date on all preventative health screenings.  Will request copies of her labs from [facility].

## 2020-03-03 ENCOUNTER — Encounter: Payer: Self-pay | Admitting: Family Medicine

## 2020-03-25 ENCOUNTER — Encounter: Payer: Self-pay | Admitting: Family Medicine

## 2020-03-25 NOTE — Telephone Encounter (Signed)
We can schedule them for a new patient appointment and discuss their concerns.  No labs or anything needed before the visit.

## 2020-06-10 ENCOUNTER — Ambulatory Visit: Payer: Self-pay | Admitting: Family Medicine

## 2020-06-10 NOTE — Telephone Encounter (Signed)
Phone call to pt.  Reported she started having pain, redness, and some swelling on top left hand approx. 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  Stated she had checked underneath the house to look for any water damage.  Denied feeling any bite or sting at that time, but later noted the above symptoms starting.  Described as redness and a little swelling on top left hand, close to the middle finger.  Reported area of redness is approx 1 ", with a white center about size of clicker end of a pen, and a black mark inside the white area, approx. Size of point of pen.  C/o tenderness.  Denied fever/ chills or any other symptoms.  No avail. Openings at PCP office.  Advised to go to UC for eval.  Pt. Stated she preferred to try and schedule a Virtual Visit through her employer, [employer].  Care advice given per protocol.  Pt. Verb. Understanding.     Reason for Disposition . [1] Red or very tender (to touch) area AND [2] started over 24 hours after the bite  Answer Assessment - Initial Assessment Questions 1. TYPE of SPIDER: "What type of spider was it?"  (e.g., name, unknown, or brief description)     unknown 2. LOCATION: "Where is the bite located?"      On top of Left hand, near the middle finger 3. PAIN: "Is there any pain?" If Yes, ask: "How bad is it?"  (Scale 1-10; or mild, moderate, severe)     No pain at present 4. SWELLING: "How big is the swelling?" (Inches, cm or compare to coins)      A little swollen, red, and a black dot with white tissue beneath the black dot 5. ONSET: "When did the bite occur?" (Minutes or hours ago)      Approx. 7:30  6. TETANUS: "When was the last tetanus booster?"      Unknown  7. 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 She got something from pharmacy the pharmacist advised. She says it looks like 2 puncture bites. When she puts peroxide on it, it looks like a whitehead. Pt not sure if she needs to be seen. Her dr, Cyndia Skeeters is not in today. Pt would like to talk with a nurse.

## 2020-06-21 ENCOUNTER — Ambulatory Visit: Payer: 59 | Admitting: Family Medicine

## 2020-07-21 ENCOUNTER — Ambulatory Visit (INDEPENDENT_AMBULATORY_CARE_PROVIDER_SITE_OTHER): Payer: 59 | Admitting: Family Medicine

## 2020-07-21 ENCOUNTER — Other Ambulatory Visit: Payer: Self-pay

## 2020-08-08 ENCOUNTER — Encounter: Payer: Self-pay | Admitting: Family Medicine

## 2020-08-11 ENCOUNTER — Encounter: Payer: Self-pay | Admitting: Family Medicine

## 2020-08-12 ENCOUNTER — Encounter: Payer: Self-pay | Admitting: Family Medicine

## 2020-08-12 ENCOUNTER — Other Ambulatory Visit: Payer: Self-pay

## 2020-08-12 ENCOUNTER — Ambulatory Visit (INDEPENDENT_AMBULATORY_CARE_PROVIDER_SITE_OTHER): Payer: 59 | Admitting: Family Medicine

## 2020-08-12 VITALS — BP 114/69 | HR 83 | Temp 98.7°F | Ht 69.0 in | Wt 133.4 lb

## 2020-08-12 DIAGNOSIS — R000 Tachycardia, unspecified: Secondary | ICD-10-CM | POA: Diagnosis not present

## 2020-08-12 NOTE — Patient Instructions (Signed)
A referral to Cardiology has been placed today.  If you have not heard from the specialty office or our referral coordinator within 1 week, please let us know and we will follow up with the referral coordinator for an update.  We will plan to see you back in 3 months for tachycardia follow up visit  You will receive a survey after today's visit either digitally by e-mail or paper by [employer] mail. Your experiences and feedback matter to [place].  Please respond so we know how we are doing as we provide care for you.  Call us with any questions/concerns/needs.  It is my goal to be available to you for your health concerns.  Thanks for choosing me to be a partner in your healthcare needs!  Harlin Rain, FNP-C Family Nurse Practitioner [facility] Phone: [phone]

## 2020-08-15 ENCOUNTER — Encounter: Payer: Self-pay | Admitting: Family Medicine

## 2020-08-15 NOTE — Assessment & Plan Note (Signed)
Reports follow up with cardiology and prescribed a beta blocker to take as needed, has not started this medication.  Requesting referral to an electrophysiologist, Dr. Adrian Prows with [facility] in [place].  Referral placed.  Plan: 1. Referral to cardiology for second opinion placed

## 2020-08-15 NOTE — Progress Notes (Signed)
Subjective:    Patient ID: Erika Hurley, female    DOB: 1966/06/16, 55 y.o.   MRN: [id number]  Erika Hurley is a 54 y.o. female presenting on 08/12/2020 for Tachycardia (pt requesting a referral for intermittent palpation )   HPI  [name] presents to clinic for a referral to cardiology.  Reports she has been having intermittent tachycardia with heart rates that elevate to almost 200.  Has not been able to reproduce this with her current cardiology office or our office.  States when she has these elevated heart rates she feels sick, dizzy, lightheaded.  Reports these happen 1-2x per month.  Depression screen PHQ 2/9 03/01/2020  Decreased Interest 0  Down, Depressed, Hopeless 0  PHQ - 2 Score 0    Social History   Tobacco Use  . Smoking status: Never Smoker  . Smokeless tobacco: Never Used  Vaping Use  . Vaping Use: Never used  Substance Use Topics  . Alcohol use: Yes    Alcohol/week: 2.0 standard drinks    Types: 2 Glasses of wine per week  . Drug use: No    Review of Systems  Constitutional: Negative.   HENT: Negative.   Eyes: Negative.   Respiratory: Negative.   Cardiovascular: Negative.   Gastrointestinal: Negative.   Endocrine: Negative.   Genitourinary: Negative.   Musculoskeletal: Negative.   Skin: Negative.   Allergic/Immunologic: Negative.   Neurological: Negative.   Hematological: Negative.   Psychiatric/Behavioral: Negative.    Per HPI unless specifically indicated above     Objective:    BP 114/69 (BP Location: Right Arm, Patient Position: Sitting, Cuff Size: Normal)   Pulse 83   Temp 98.7 F (37.1 C) (Oral)   Ht 5\' 9"  (1.753 m)   Wt 133 lb 6.4 oz (60.5 kg)   BMI 19.70 kg/m   Wt Readings from Last 3 Encounters:  08/12/20 133 lb 6.4 oz (60.5 kg)  03/01/20 140 lb 12.8 oz (63.9 kg)  10/28/19 138 lb (62.6 kg)    Physical Exam Vitals and nursing note reviewed.  Constitutional:      General: She is not in acute distress.     Appearance: Normal appearance. She is well-developed, well-groomed and normal weight. She is not ill-appearing or toxic-appearing.  HENT:     Head: Normocephalic and atraumatic.     Nose:     Comments: [name] is in place, covering mouth and nose. Eyes:     General: Lids are normal. Vision grossly intact.        Right eye: No discharge.        Left eye: No discharge.     Extraocular Movements: Extraocular movements intact.     Conjunctiva/sclera: Conjunctivae normal.     Pupils: Pupils are equal, round, and reactive to light.  Cardiovascular:     Rate and Rhythm: Normal rate and regular rhythm.     Pulses: Normal pulses.          Dorsalis pedis pulses are 2+ on the right side and 2+ on the left side.     Heart sounds: Normal heart sounds. No murmur heard.  No friction rub. No gallop.   Pulmonary:     Effort: Pulmonary effort is normal. No respiratory distress.     Breath sounds: Normal breath sounds.  Musculoskeletal:     Right lower leg: No edema.     Left lower leg: No edema.  Skin:    General: Skin is warm and dry.  Capillary Refill: Capillary refill takes less than 2 seconds.  Neurological:     General: No focal deficit present.     Mental Status: She is alert and oriented to person, place, and time.  Psychiatric:        Attention and Perception: Attention and perception normal.        Mood and Affect: Mood and affect normal.        Speech: Speech normal.        Behavior: Behavior normal. Behavior is cooperative.        Thought Content: Thought content normal.        Cognition and Memory: Cognition and memory normal.        Judgment: Judgment normal.    Results for orders placed or performed in visit on 03/01/20  HM HEPATITIS C SCREENING LAB  Result Value Ref Range   HM Hepatitis Screen Negative-Validated       Assessment & Plan:   Problem List Items Addressed This Visit      Other   Tachycardia - Primary    Reports follow up with cardiology and prescribed  a beta blocker to take as needed, has not started this medication.  Requesting referral to an electrophysiologist, Dr. Adrian Prows with [facility] in [place].  Referral placed.  Plan: 1. Referral to cardiology for second opinion placed      Relevant Orders   Ambulatory referral to Cardiology      No orders of the defined types were placed in this encounter.   Follow up plan: Return in about 3 months (around 11/12/2020) for Tachycardia follow up visit.   Harlin Rain, [place] Family Nurse Practitioner [facility] 08/12/2020, 4:01 PM

## 2020-08-22 ENCOUNTER — Encounter: Payer: Self-pay | Admitting: Family Medicine

## 2020-08-30 ENCOUNTER — Encounter: Payer: Self-pay | Admitting: Family Medicine

## 2020-08-31 ENCOUNTER — Encounter: Payer: Self-pay | Admitting: Family Medicine

## 2020-08-31 ENCOUNTER — Other Ambulatory Visit: Payer: Self-pay | Admitting: Obstetrics & Gynecology

## 2020-08-31 DIAGNOSIS — Z1231 Encounter for screening mammogram for malignant neoplasm of breast: Secondary | ICD-10-CM

## 2020-08-31 NOTE — Telephone Encounter (Signed)
Need labs

## 2020-09-12 ENCOUNTER — Encounter: Payer: Self-pay | Admitting: Dermatology

## 2020-09-12 ENCOUNTER — Other Ambulatory Visit: Payer: Self-pay

## 2020-09-12 ENCOUNTER — Ambulatory Visit (INDEPENDENT_AMBULATORY_CARE_PROVIDER_SITE_OTHER): Payer: 59 | Admitting: Family Medicine

## 2020-09-12 ENCOUNTER — Encounter: Payer: Self-pay | Admitting: Family Medicine

## 2020-09-12 ENCOUNTER — Ambulatory Visit (INDEPENDENT_AMBULATORY_CARE_PROVIDER_SITE_OTHER): Payer: 59 | Admitting: Dermatology

## 2020-09-12 VITALS — BP 109/78 | HR 77 | Temp 98.9°F | Ht 69.0 in | Wt 131.0 lb

## 2020-09-12 DIAGNOSIS — R000 Tachycardia, unspecified: Secondary | ICD-10-CM | POA: Diagnosis not present

## 2020-09-12 DIAGNOSIS — D2372 Other benign neoplasm of skin of left lower limb, including hip: Secondary | ICD-10-CM | POA: Diagnosis not present

## 2020-09-12 DIAGNOSIS — Z1283 Encounter for screening for malignant neoplasm of skin: Secondary | ICD-10-CM | POA: Diagnosis not present

## 2020-09-12 DIAGNOSIS — L853 Xerosis cutis: Secondary | ICD-10-CM

## 2020-09-12 DIAGNOSIS — L570 Actinic keratosis: Secondary | ICD-10-CM | POA: Diagnosis not present

## 2020-09-12 DIAGNOSIS — L821 Other seborrheic keratosis: Secondary | ICD-10-CM

## 2020-09-12 DIAGNOSIS — Z0000 Encounter for general adult medical examination without abnormal findings: Secondary | ICD-10-CM | POA: Insufficient documentation

## 2020-09-12 DIAGNOSIS — E782 Mixed hyperlipidemia: Secondary | ICD-10-CM | POA: Diagnosis not present

## 2020-09-12 DIAGNOSIS — D225 Melanocytic nevi of trunk: Secondary | ICD-10-CM | POA: Diagnosis not present

## 2020-09-12 DIAGNOSIS — L988 Other specified disorders of the skin and subcutaneous tissue: Secondary | ICD-10-CM

## 2020-09-12 DIAGNOSIS — L578 Other skin changes due to chronic exposure to nonionizing radiation: Secondary | ICD-10-CM

## 2020-09-12 DIAGNOSIS — D239 Other benign neoplasm of skin, unspecified: Secondary | ICD-10-CM

## 2020-09-12 DIAGNOSIS — R319 Hematuria, unspecified: Secondary | ICD-10-CM | POA: Insufficient documentation

## 2020-09-12 DIAGNOSIS — L814 Other melanin hyperpigmentation: Secondary | ICD-10-CM

## 2020-09-12 DIAGNOSIS — D229 Melanocytic nevi, unspecified: Secondary | ICD-10-CM

## 2020-09-12 LAB — POCT URINALYSIS DIPSTICK
Bilirubin, UA: NEGATIVE
Glucose, UA: NEGATIVE
Ketones, UA: NEGATIVE
Leukocytes, UA: NEGATIVE
Nitrite, UA: NEGATIVE
Protein, UA: NEGATIVE
Spec Grav, UA: <=1.005 — AB (ref 1.010–1.025)
Urobilinogen, UA: 0.2 E.U./dL
pH, UA: 6 (ref 5.0–8.0)

## 2020-09-12 LAB — CBC WITH DIFFERENTIAL/PLATELET
Eosinophils Absolute: 29 cells/uL (ref 15–500)
Total Lymphocyte: 33.2 %

## 2020-09-12 NOTE — Patient Instructions (Signed)
Cryotherapy Aftercare  . Wash gently with soap and water everyday.   . Apply Vaseline and Band-Aid daily until healed.  

## 2020-09-12 NOTE — Progress Notes (Signed)
Subjective:    Patient ID: Erika Hurley, female    DOB: [birth year], 54 y.o.   MRN: [id number]  Erika Hurley is a 54 y.o. female presenting on 09/12/2020 for Annual Exam   HPI  HEALTH MAINTENANCE:  Weight/BMI: Normal, BMI 19.35% Physical activity: Stays active Diet: Regular Seatbelt: Always Sunscreen: Does not, follows with dermatology PAP: Completed 10/28/2019, Due 10/27/2022 Mammogram: Completed 11/01/2019, Due 10/31/2020 Colon cancer screening: Completed 12/06/2014, Due 12/06/2024 HIV & Hep C Screening: Offered and declined  GC/CT: Offered and declined  Optometry: Yearly  Dentistry: Every 6 months  IMMUNIZATIONS: Tetanus: Up to date, 05/10/2017 Influenza: Discussed COVID: Discussed  Depression screen [facility] 2/9 09/12/2020 03/01/2020  Decreased Interest 1 0  Down, Depressed, Hopeless 1 0  PHQ - 2 Score 2 0  Altered sleeping 1 -  Tired, decreased energy 1 -  Change in appetite 0 -  Feeling bad or failure about yourself  0 -  Trouble concentrating 0 -  Moving slowly or fidgety/restless 0 -  Suicidal thoughts 0 -  PHQ-9 Score 4 -  Difficult doing work/chores Not difficult at all -    Past Medical History:  Diagnosis Date  . Arthritis   . Cervical dysplasia   . History of abnormal cervical Pap smear    HGSIL  . Migraines   . Solitary cyst of breast   . Stroke [facility])    Past Surgical History:  Procedure Laterality Date  . BREAST CYST ASPIRATION Right 2005?      Marland Kitchen LEEP  2004  . spinal tap  2012   Social History   Socioeconomic History  . Marital status: Soil scientist    Spouse name: Not on file  . Number of children: Not on file  . Years of education: Not on file  . Highest education level: Not on file  Occupational History  . Not on file  Tobacco Use  . Smoking status: Never Smoker  . Smokeless tobacco: Never Used  Vaping Use  . Vaping Use: Never used  Substance and Sexual Activity  . Alcohol use: Yes    Alcohol/week: 2.0 standard  drinks    Types: 2 Glasses of wine per week  . Drug use: No  . Sexual activity: Yes    Partners: Male    Birth control/protection: Post-menopausal  Other Topics Concern  . Not on file  Social History Narrative   Divorced    Employed Engineer, manufacturing systems    Social Determinants of Health   Financial Resource Strain:   . Difficulty of Paying Living Expenses: Not on file  Food Insecurity:   . Worried About Charity fundraiser in the Last Year: Not on file  . Ran Out of Food in the Last Year: Not on file  Transportation Needs:   . Lack of Transportation (Medical): Not on file  . Lack of Transportation (Non-Medical): Not on file  Physical Activity:   . Days of Exercise per Week: Not on file  . Minutes of Exercise per Session: Not on file  Stress:   . Feeling of Stress : Not on file  Social Connections:   . Frequency of Communication with Friends and Family: Not on file  . Frequency of Social Gatherings with Friends and Family: Not on file  . Attends Religious Services: Not on file  . Active Member of Clubs or Organizations: Not on file  . Attends Archivist Meetings: Not on file  . Marital Status: Not on file  Intimate Partner Violence:   . Fear of Current or Ex-Partner: Not on file  . Emotionally Abused: Not on file  . Physically Abused: Not on file  . Sexually Abused: Not on file   Family History  Problem Relation Age of Onset  . Cancer Mother   . Hypertension Mother   . Hyperlipidemia Mother   . Kidney disease Mother   . Breast cancer Mother 35       twice  . Stroke Father   . Heart disease Father   . Hypertension Father   . Hyperlipidemia Father   . Kidney disease Father   . Cancer Maternal Grandfather    Current Outpatient Medications on File Prior to Visit  Medication Sig  . acyclovir (ZOVIRAX) 200 MG capsule acyclovir 200 mg capsule daily for suppression (Patient taking differently: as needed. acyclovir 200 mg capsule daily for suppression)  .  cyclobenzaprine (FLEXERIL) 10 MG tablet Take 10 mg by mouth at bedtime.   . hydrocortisone-pramoxine (ANALPRAM-HC) 2.5-1 % rectal cream Place rectally 4 (four) times daily. For irritated and painful hemorrhoids  . Melatonin 3 MG TABS Take 10 mg by mouth.   . metoprolol tartrate (LOPRESSOR) 25 MG tablet Take 12.5 mg by mouth as needed.   . Multiple Vitamins-Minerals (ZINC PO) Take by mouth daily.  . rosuvastatin (CRESTOR) 5 MG tablet Take 1 tablet (5 mg total) by mouth daily. (Patient taking differently: Take 5 mg by mouth daily as needed. )  . SUMAtriptan (IMITREX) 50 MG tablet Take 1 tablet (50 mg total) by mouth every 2 (two) hours as needed for migraine or headache. May repeat in 2 hours if headache persists or recurs.   No current facility-administered medications on file prior to visit.    Per HPI unless specifically indicated above     Objective:    BP 109/78   Pulse 77   Temp 98.9 F (37.2 C) (Oral)   Ht 5\' 9"  (1.753 m)   Wt 131 lb (59.4 kg)   SpO2 100%   BMI 19.35 kg/m   Wt Readings from Last 3 Encounters:  09/12/20 131 lb (59.4 kg)  08/12/20 133 lb 6.4 oz (60.5 kg)  03/01/20 140 lb 12.8 oz (63.9 kg)    Physical Exam Vitals and nursing note reviewed.  Constitutional:      General: She is not in acute distress.    Appearance: Normal appearance. She is well-developed, well-groomed and normal weight. She is not ill-appearing or toxic-appearing.  HENT:     Head: Normocephalic and atraumatic.     Right Ear: Tympanic membrane, ear canal and external ear normal. There is no impacted cerumen.     Left Ear: Tympanic membrane, ear canal and external ear normal. There is no impacted cerumen.     Nose: Nose normal. No congestion or rhinorrhea.     Mouth/Throat:     Lips: Pink.     Mouth: Mucous membranes are moist.     Pharynx: Oropharynx is clear. Uvula midline. No oropharyngeal exudate or posterior oropharyngeal erythema.  Eyes:     General: Lids are normal. Vision grossly  intact. No scleral icterus.       Right eye: No discharge.        Left eye: No discharge.     Extraocular Movements: Extraocular movements intact.     Conjunctiva/sclera: Conjunctivae normal.     Pupils: Pupils are equal, round, and reactive to light.  Neck:     Thyroid: No thyroid mass or thyromegaly.  Cardiovascular:     Rate and Rhythm: Normal rate and regular rhythm.     Pulses: Normal pulses.          Dorsalis pedis pulses are 2+ on the right side and 2+ on the left side.     Heart sounds: Normal heart sounds. No murmur heard.  No friction rub. No gallop.   Pulmonary:     Effort: Pulmonary effort is normal. No respiratory distress.     Breath sounds: Normal breath sounds.  Abdominal:     General: Abdomen is flat. Bowel sounds are normal. There is no distension.     Palpations: Abdomen is soft. There is no hepatomegaly, splenomegaly or mass.     Tenderness: There is no abdominal tenderness. There is no guarding or rebound.     Hernia: No hernia is present.  Musculoskeletal:        General: Normal range of motion.     Cervical back: Normal range of motion and neck supple. No tenderness.     Right lower leg: No edema.     Left lower leg: No edema.     Comments: Normal tone, strength 5/5 BUE & BLE  Feet:     Right foot:     Skin integrity: Skin integrity normal.     Left foot:     Skin integrity: Skin integrity normal.  Lymphadenopathy:     Cervical: No cervical adenopathy.  Skin:    General: Skin is warm and dry.     Capillary Refill: Capillary refill takes less than 2 seconds.  Neurological:     General: No focal deficit present.     Mental Status: She is alert and oriented to person, place, and time.     Cranial Nerves: No cranial nerve deficit.     Sensory: No sensory deficit.     Motor: No weakness.     Coordination: Coordination normal.     Gait: Gait normal.     Deep Tendon Reflexes: Reflexes normal.  Psychiatric:        Attention and Perception: Attention and  perception normal.        Mood and Affect: Mood and affect normal.        Speech: Speech normal.        Behavior: Behavior normal. Behavior is cooperative.        Thought Content: Thought content normal.        Cognition and Memory: Cognition and memory normal.        Judgment: Judgment normal.     Results for orders placed or performed in visit on 09/12/20  POCT urinalysis dipstick  Result Value Ref Range   Color, UA yellow    Clarity, UA vlear    Glucose, UA Negative Negative   Bilirubin, UA neg    Ketones, UA neg    Spec Grav, UA <=1.005 (A) 1.010 - 1.025   Blood, UA trace non    pH, UA 6.0 5.0 - 8.0   Protein, UA Negative Negative   Urobilinogen, UA 0.2 0.2 or 1.0 E.U./dL   Nitrite, UA neg    Leukocytes, UA Negative Negative   Appearance yellow, clear    Odor none       Assessment & Plan:   Problem List Items Addressed This Visit      Other   Hyperlipidemia    Status unknown.  Recheck labs.  Followup after labs.      Relevant Orders   Lipid Profile  Tachycardia    Has upcoming appointment with [facility] in [place] to establish with electrophysiologist and is requesting zio monitor for evaluation of intermittent tachycardia.      Relevant Orders   TSH + free T4   Annual physical exam - Primary    Annual physical exam without new findings.  Well adult with no acute concerns.  Plan: 1. Obtain health maintenance screenings as above according to age. - Increase physical activity to 30 minutes most days of the week.  - Eat healthy diet high in vegetables and fruits; low in refined carbohydrates. - Screening labs and tests as ordered 2. Return 1 year for annual physical.       Relevant Orders   POCT urinalysis dipstick (Completed)   CBC with Differential   COMPLETE METABOLIC PANEL WITH GFR   Lipid Profile   TSH + free T4   Hematuria    Microscopic hematuria on POCT U/A.  Will send to lab for microscopy      Relevant Orders   Urinalysis, microscopic only        No orders of the defined types were placed in this encounter.   Follow up plan: Return in about 1 year (around 09/12/2021) for CPE.  Harlin Rain, FNP-C Family Nurse Practitioner [facility] 09/12/2020, 10:09 AM

## 2020-09-12 NOTE — Patient Instructions (Signed)
Have your labs drawn and we will contact you with the results.  Well Visit: Care Instructions Overview  Well visits can help you stay healthy. Your provider has checked your overall health and may have suggested ways to take good care of yourself. Your provider also may have recommended tests. At home, you can help prevent illness with healthy eating, regular exercise, and other steps.  Follow-up care is a key part of your treatment and safety. Be sure to make and go to all appointments, and call your provider if you are having problems. It's also a good idea to know your test results and keep a list of the medicines you take.  How can you care for yourself at home?   Get screening tests that you and your doctor decide on. Screening helps find diseases before any symptoms appear.   Eat healthy foods. Choose fruits, vegetables, whole grains, protein, and low-fat dairy foods. Limit fat, especially saturated fat. Reduce salt in your diet.   Limit alcohol. If you are a man, have no more than 2 drinks a day or 14 drinks a week. If you are a woman, have no more than 1 drink a day or 7 drinks a week.   Get at least 30 minutes of physical activity on most days of the week.  We recommend you go no more than 2 days in a row without exercise. Walking is a good choice. You also may want to do other activities, such as running, swimming, cycling, or playing tennis or team sports. Discuss any changes in your exercise program with your provider.   Reach and stay at a healthy weight. This will lower your risk for many problems, such as obesity, diabetes, heart disease, and high blood pressure.   Do not smoke or allow others to smoke around you. If you need help quitting, talk to your provider about stop-smoking programs and medicines. These can increase your chances of quitting for good.  Can call 1-800-QUIT-NOW (1-800-784-8669) for the [place] Quitline, assistance with smoking cessation.   Care  for your mental health. It is easy to get weighed down by worry and stress. Learn strategies to manage stress, like deep breathing and mindfulness, and stay connected with your family and community. If you find you often feel sad or hopeless, talk with your provider. Treatment can help.   Talk to your provider about whether you have any risk factors for sexually transmitted infections (STIs). You can help prevent STIs if you wait to have sex with a new partner (or partners) until you've each been tested for STIs. It also helps if you use condoms (female or female condoms) and if you limit your sex partners to one person who only has sex with you. Vaccines are available for some STIs, such as HPV (these are age dependent).   If you think you may have a problem with alcohol or drug use, talk to your provider. This includes prescription medicines (such as amphetamines and opioids) and illegal drugs (such as cocaine and methamphetamine). Your provider can help you figure out what type of treatment is best for you.   If you have concerns about domestic violence or intimate partner violence, there are resources available to you. National Domestic Abuse Hotline 1-800-7233   Protect your skin from too much sun. When you're outdoors from 10 a.m. to 4 p.m., stay in the shade or cover up with clothing and a hat with a wide brim. Wear sunglasses that block UV rays.   Even when it's cloudy, put broad-spectrum sunscreen (SPF 30 or higher) on any exposed skin.   See a dentist one or two times a year for checkups and to have your teeth cleaned.   See an eye doctor once per year for an eye exam.   Wear a seat belt in the car.  When should you call for help?  Watch closely for changes in your health, and be sure to contact your provider if you have any problems or symptoms that concern you.  We will plan to see you back in 12 months for your physical  You will receive a survey after today's visit either  digitally by e-mail or paper by USPS mail. Your experiences and feedback matter to us.  Please respond so we know how we are doing as we provide care for you.  Call us with any questions/concerns/needs.  It is my goal to be available to you for your health concerns.  Thanks for choosing me to be a partner in your healthcare needs!  Demesha Boorman Marie Lino Wickliff, FNP-C Family Nurse Practitioner [facility] Phone: [phone]  

## 2020-09-12 NOTE — Assessment & Plan Note (Signed)
Status unknown.  Recheck labs.  Followup after labs.  

## 2020-09-12 NOTE — Assessment & Plan Note (Signed)
Microscopic hematuria on POCT U/A.  Will send to lab for microscopy

## 2020-09-12 NOTE — Assessment & Plan Note (Signed)
Annual physical exam without new findings.  Well adult with no acute concerns.  Plan: 1. Obtain health maintenance screenings as above according to age. - Increase physical activity to 30 minutes most days of the week.  - Eat healthy diet high in vegetables and fruits; low in refined carbohydrates. - Screening labs and tests as ordered 2. Return 1 year for annual physical.  

## 2020-09-12 NOTE — Progress Notes (Signed)
   Follow-Up Visit   Subjective  Erika Hurley is a 54 y.o. female who presents for the following: Annual Exam (Total body skin exam) and itchy spot (R arm itchy).  She has had similar lesions frozen off in the past.   The following portions of the chart were reviewed this encounter and updated as appropriate:      Review of Systems:  No other skin or systemic complaints except as noted in HPI or Assessment and Plan.  Objective  Well appearing patient in no apparent distress; mood and affect are within normal limits.  A full examination was performed including scalp, head, eyes, ears, nose, lips, neck, chest, axillae, abdomen, back, buttocks, bilateral upper extremities, bilateral lower extremities, hands, feet, fingers, toes, fingernails, and toenails. All findings within normal limits unless otherwise noted below.  Objective  R upper arm x 2, R forearm x 3, L forearm x 2, L upper arm x 1 (8): Pink scaly macules  Objective    Left Flank at braline: 4.25mm speckled brown macule  Objective  Left lateral knee: Firm pink/brown papulenodule with dimple sign.   Objective  face: Rhytides and volume loss.    Assessment & Plan    Lentigines - Scattered tan macules - Discussed due to sun exposure - Benign, observe - Call for any changes  Seborrheic Keratoses - Stuck-on, waxy, tan-brown papules and plaques  - Discussed benign etiology and prognosis. - Observe - Call for any changes  Skin cancer screening performed today.  Actinic Damage - chronic, secondary to cumulative UV radiation exposure/sun exposure over time - diffuse scaly erythematous macules with underlying dyspigmentation - Recommend daily broad spectrum sunscreen SPF 30+ to sun-exposed areas, reapply every 2 hours as needed.  - Call for new or changing lesions.  Melanocytic Nevi - Tan-brown and/or pink-flesh-colored symmetric macules and papules - Benign appearing on exam today - Observation - Call  clinic for new or changing moles - Recommend daily use of broad spectrum spf 30+ sunscreen to sun-exposed areas.   Xerosis - diffuse xerotic patches - recommend gentle, hydrating skin care - gentle skin care handout given   AK (actinic keratosis) (8) R upper arm x 2, R forearm x 3, L forearm x 2, L upper arm x 1  Vs ISK  Destruction of lesion - R upper arm x 2, R forearm x 3, L forearm x 2, L upper arm x 1  Destruction method: cryotherapy   Informed consent: discussed and consent obtained   Lesion destroyed using liquid nitrogen: Yes   Region frozen until ice ball extended beyond lesion: Yes   Outcome: patient tolerated procedure well with no complications   Post-procedure details: wound care instructions given    Nevus Left Flank at braline  Benign-appearing.  Observation.  Call clinic for new or changing moles.  Recommend daily use of broad spectrum spf 30+ sunscreen to sun-exposed areas.    Dermatofibroma Left lateral knee  Benign, observe  Elastosis of skin face  Discussed Botox to frown complex, forehead ~25-30 units, info given  Return in about 1 year (around 09/12/2021) for TBSE, hx of AK.  I, Othelia Pulling, RMA, am acting as scribe for Brendolyn Patty, MD . Documentation: I have reviewed the above documentation for accuracy and completeness, and I agree with the above.  Brendolyn Patty MD

## 2020-09-12 NOTE — Assessment & Plan Note (Signed)
Has upcoming appointment with [facility] in [place] to establish with electrophysiologist and is requesting zio monitor for evaluation of intermittent tachycardia.

## 2020-09-13 ENCOUNTER — Encounter: Payer: Self-pay | Admitting: Family Medicine

## 2020-09-13 LAB — COMPLETE METABOLIC PANEL WITH GFR
AG Ratio: 1.9 (calc) (ref 1.0–2.5)
ALT: 15 U/L (ref 6–29)
AST: 23 U/L (ref 10–35)
Albumin: 4.9 g/dL (ref 3.6–5.1)
Alkaline phosphatase (APISO): 85 U/L (ref 37–153)
BUN: 12 mg/dL (ref 7–25)
CO2: 26 mmol/L (ref 20–32)
Calcium: 10 mg/dL (ref 8.6–10.4)
Chloride: 106 mmol/L (ref 98–110)
Creat: 0.9 mg/dL (ref 0.50–1.05)
GFR, Est African American: 83 mL/min/{1.73_m2} (ref >=60)
GFR, Est Non African American: 71 mL/min/{1.73_m2} (ref >=60)
Globulin: 2.6 g/dL (calc) (ref 1.9–3.7)
Glucose, Bld: 104 mg/dL — ABNORMAL HIGH (ref 65–99)
Potassium: 4.9 mmol/L (ref 3.5–5.3)
Sodium: 143 mmol/L (ref 135–146)
Total Bilirubin: 0.6 mg/dL (ref 0.2–1.2)
Total Protein: 7.5 g/dL (ref 6.1–8.1)

## 2020-09-13 LAB — URINALYSIS, MICROSCOPIC ONLY
Bacteria, UA: NONE SEEN /HPF
Hyaline Cast: NONE SEEN /LPF
RBC / HPF: NONE SEEN /HPF (ref 0–2)
Squamous Epithelial / LPF: NONE SEEN /HPF (ref <=5)
WBC, UA: NONE SEEN /HPF (ref 0–5)

## 2020-09-13 LAB — CBC WITH DIFFERENTIAL/PLATELET
Absolute Monocytes: 429 cells/uL (ref 200–950)
Basophils Absolute: 29 cells/uL (ref 0–200)
Basophils Relative: 0.5 %
Eosinophils Relative: 0.5 %
HCT: 40.9 % (ref 35.0–45.0)
Hemoglobin: 13.6 g/dL (ref 11.7–15.5)
Lymphs Abs: 1926 cells/uL (ref 850–3900)
MCH: 31.2 pg (ref 27.0–33.0)
MCHC: 33.3 g/dL (ref 32.0–36.0)
MCV: 93.8 fL (ref 80.0–100.0)
MPV: 9.9 fL (ref 7.5–12.5)
Monocytes Relative: 7.4 %
Neutro Abs: 3387 cells/uL (ref 1500–7800)
Neutrophils Relative %: 58.4 %
Platelets: 339 10*3/uL (ref 140–400)
RBC: 4.36 10*6/uL (ref 3.80–5.10)
RDW: 11.4 % (ref 11.0–15.0)
WBC: 5.8 10*3/uL (ref 3.8–10.8)

## 2020-09-13 LAB — LIPID PANEL
Cholesterol: 157 mg/dL (ref <200)
HDL: 53 mg/dL (ref >=50)
LDL Cholesterol (Calc): 83 mg/dL (calc)
Non-HDL Cholesterol (Calc): 104 mg/dL (calc) (ref <130)
Total CHOL/HDL Ratio: 3 (calc) (ref <5.0)
Triglycerides: 109 mg/dL (ref <150)

## 2020-09-13 LAB — TSH+FREE T4: TSH W/REFLEX TO FT4: 1.68 mIU/L (ref 0.40–4.50)

## 2020-09-14 NOTE — Telephone Encounter (Signed)
Advised on labs before having this message forwarded to me.  All addressed.

## 2020-09-19 ENCOUNTER — Encounter: Payer: Self-pay | Admitting: Family Medicine

## 2020-10-31 ENCOUNTER — Encounter: Payer: Self-pay | Admitting: Obstetrics & Gynecology

## 2020-10-31 ENCOUNTER — Ambulatory Visit
Admission: RE | Admit: 2020-10-31 | Discharge: 2020-10-31 | Disposition: A | Discharge status: Home / Self Care | Payer: 59 | Source: Ambulatory Visit | Attending: Obstetrics & Gynecology | Admitting: Obstetrics & Gynecology

## 2020-10-31 ENCOUNTER — Ambulatory Visit (INDEPENDENT_AMBULATORY_CARE_PROVIDER_SITE_OTHER): Payer: 59 | Admitting: Obstetrics & Gynecology

## 2020-10-31 ENCOUNTER — Other Ambulatory Visit: Payer: Self-pay

## 2020-10-31 VITALS — BP 120/80 | Ht 69.0 in | Wt 130.0 lb

## 2020-10-31 DIAGNOSIS — Z1231 Encounter for screening mammogram for malignant neoplasm of breast: Secondary | ICD-10-CM | POA: Diagnosis not present

## 2020-10-31 DIAGNOSIS — Z01419 Encounter for gynecological examination (general) (routine) without abnormal findings: Secondary | ICD-10-CM | POA: Diagnosis not present

## 2020-10-31 DIAGNOSIS — Z1211 Encounter for screening for malignant neoplasm of colon: Secondary | ICD-10-CM | POA: Diagnosis not present

## 2020-10-31 NOTE — Progress Notes (Signed)
HPI:      [name] is a 54 y.o. G1P1001 who LMP was in the past, she presents today for her annual examination.  The patient has no complaints today. The patient is not sexually active. Herlast pap: approximate date 2020 and was normal and last mammogram: approximate date today, and 2020 and was normal.  The patient does perform self breast exams.  There is no notable family history of breast or ovarian cancer in her family. The patient is not taking hormone replacement therapy. Patient denies post-menopausal vaginal bleeding.   The patient has regular exercise: yes. The patient denies current symptoms of depression.    GYN Hx: Last Colonoscopy:6 years ago. Normal.  Last DEXA: never ago.    PMHx: Past Medical History:  Diagnosis Date  . Arthritis   . Cervical dysplasia   . History of abnormal cervical Pap smear    HGSIL  . Migraines   . Solitary cyst of breast   . Stroke [facility])    Past Surgical History:  Procedure Laterality Date  . BREAST CYST ASPIRATION Right 2005?      Marland Kitchen LEEP  2004  . spinal tap  2012   Family History  Problem Relation Age of Onset  . Cancer Mother   . Hypertension Mother   . Hyperlipidemia Mother   . Kidney disease Mother   . Breast cancer Mother 86       twice  . Stroke Father   . Heart disease Father   . Hypertension Father   . Hyperlipidemia Father   . Kidney disease Father   . Cancer Maternal Grandfather    Social History   Tobacco Use  . Smoking status: Never Smoker  . Smokeless tobacco: Never Used  Vaping Use  . Vaping Use: Never used  Substance Use Topics  . Alcohol use: Yes    Alcohol/week: 2.0 standard drinks    Types: 2 Glasses of wine per week  . Drug use: No    Current Outpatient Medications:  .  acyclovir (ZOVIRAX) 200 MG capsule, acyclovir 200 mg capsule daily for suppression (Patient taking differently: as needed. acyclovir 200 mg capsule daily for suppression), Disp: 90 capsule, Rfl: 3 .  cyclobenzaprine  (FLEXERIL) 10 MG tablet, Take 10 mg by mouth at bedtime., Disp: , Rfl:  .  hydrocortisone-pramoxine (ANALPRAM-HC) 2.5-1 % rectal cream, Place rectally 4 (four) times daily. For irritated and painful hemorrhoids, Disp: 15 g, Rfl: 2 .  Melatonin 3 MG TABS, Take 10 mg by mouth. , Disp: , Rfl:  .  metoprolol tartrate (LOPRESSOR) 25 MG tablet, Take 12.5 mg by mouth as needed. , Disp: , Rfl:  .  Multiple Vitamins-Minerals (ZINC PO), Take by mouth daily., Disp: , Rfl:  .  rosuvastatin (CRESTOR) 5 MG tablet, Take 1 tablet (5 mg total) by mouth daily. (Patient taking differently: Take 5 mg by mouth daily as needed.), Disp: 90 tablet, Rfl: 2 .  SUMAtriptan (IMITREX) 50 MG tablet, Take 1 tablet (50 mg total) by mouth every 2 (two) hours as needed for migraine or headache. May repeat in 2 hours if headache persists or recurs., Disp: 10 tablet, Rfl: 2 Allergies: Sulfa antibiotics  Review of Systems  Constitutional: Negative for chills, fever and malaise/fatigue.  HENT: Negative for congestion, sinus pain and sore throat.   Eyes: Negative for blurred vision and pain.  Respiratory: Negative for cough and wheezing.   Cardiovascular: Negative for chest pain and leg swelling.  Gastrointestinal: Negative for abdominal  pain, constipation, diarrhea, heartburn, nausea and vomiting.  Genitourinary: Negative for dysuria, frequency, hematuria and urgency.  Musculoskeletal: Negative for back pain, joint pain, myalgias and neck pain.  Skin: Negative for itching and rash.  Neurological: Negative for dizziness, tremors and weakness.  Endo/Heme/Allergies: Does not bruise/bleed easily.  Psychiatric/Behavioral: Negative for depression. The patient is not nervous/anxious and does not have insomnia.     Objective: BP 120/80   Ht 5\' 9"  (1.753 m)   Wt 130 lb (59 kg)   BMI 19.20 kg/m   Filed Weights   10/31/20 1524  Weight: 130 lb (59 kg)   Body mass index is 19.2 kg/m. Physical Exam Constitutional:      General:  She is not in acute distress.    Appearance: She is well-developed and well-nourished.  Genitourinary:     Vagina, uterus and rectum normal.     There is no rash or lesion on the right labia.     There is no rash or lesion on the left labia.    No lesions in the vagina.     No vaginal bleeding.      Right Adnexa: not tender and no mass present.    Left Adnexa: not tender and no mass present.    No cervical motion tenderness, friability, lesion or polyp.     Uterus is mobile.     Uterus is not enlarged.     No uterine mass detected.    Uterus is midaxial.     Pelvic exam was performed with patient supine.  Breasts:     Right: No mass, skin change or tenderness.     Left: No mass, skin change or tenderness.    HENT:     Head: Normocephalic and atraumatic. No laceration.     Right Ear: Hearing normal.     Left Ear: Hearing normal.     Nose: No epistaxis or foreign body.     Mouth/Throat:     Mouth: Oropharynx is clear and moist and mucous membranes are normal.     Pharynx: Uvula midline.  Eyes:     Pupils: Pupils are equal, round, and reactive to light.  Neck:     Thyroid: No thyromegaly.  Cardiovascular:     Rate and Rhythm: Normal rate and regular rhythm.     Heart sounds: No murmur heard. No friction rub. No gallop.   Pulmonary:     Effort: Pulmonary effort is normal. No respiratory distress.     Breath sounds: Normal breath sounds. No wheezing.  Abdominal:     General: Bowel sounds are normal. There is no distension.     Palpations: Abdomen is soft.     Tenderness: There is no abdominal tenderness. There is no rebound.  Musculoskeletal:        General: Normal range of motion.     Cervical back: Normal range of motion and neck supple.  Neurological:     Mental Status: She is alert and oriented to person, place, and time.     Cranial Nerves: No cranial nerve deficit.  Skin:    General: Skin is warm and dry.  Psychiatric:        Mood and Affect: Mood and affect  normal.        Judgment: Judgment normal.  Vitals reviewed.     Assessment: Annual Exam 1. Women's annual routine gynecological examination   2. Screen for colon cancer     Plan:  1.  Cervical Screening-  Pap smear schedule reviewed with patient  2. Breast screening- Exam annually and mammogram scheduled  3. Colonoscopy every 10 years, Hemoccult testing after age 19  4. Labs managed by PCP  5. Counseling for hormonal therapy: none              6. FRAX - FRAX score for assessing the 10 year probability for fracture calculated and discussed today.  Based on age and score today, DEXA is not currently scheduled.    F/U  Return in about 1 year (around 10/31/2021) for Annual.  Annamarie Major, MD, [facility], [facility] 10/31/2020  3:57 PM

## 2020-10-31 NOTE — Patient Instructions (Signed)
PAP every three years Mammogram every year Colonoscopy every 10 years Labs yearly (with PCP)  Thank you for choosing [facility]. As part of our ongoing efforts to improve patient experience, we would appreciate your feedback. Please fill out the short survey that you will receive by mail or MyChart. Your opinion is important to us! - Dr. Hurbert Duran   

## 2020-11-02 ENCOUNTER — Other Ambulatory Visit: Payer: Self-pay | Admitting: Obstetrics & Gynecology

## 2020-11-02 DIAGNOSIS — G43809 Other migraine, not intractable, without status migrainosus: Secondary | ICD-10-CM

## 2020-11-09 DIAGNOSIS — I471 Supraventricular tachycardia, unspecified: Secondary | ICD-10-CM | POA: Insufficient documentation

## 2020-11-09 DIAGNOSIS — G319 Degenerative disease of nervous system, unspecified: Secondary | ICD-10-CM | POA: Insufficient documentation

## 2020-11-09 DIAGNOSIS — R55 Syncope and collapse: Secondary | ICD-10-CM | POA: Insufficient documentation

## 2020-11-23 ENCOUNTER — Encounter: Payer: Self-pay | Admitting: Family Medicine

## 2020-11-24 ENCOUNTER — Other Ambulatory Visit: Payer: 59

## 2020-11-24 DIAGNOSIS — Z20822 Contact with and (suspected) exposure to covid-19: Secondary | ICD-10-CM

## 2020-11-26 ENCOUNTER — Encounter: Payer: Self-pay | Admitting: Family Medicine

## 2020-11-26 LAB — NOVEL CORONAVIRUS, NAA: SARS-CoV-2, NAA: DETECTED — AB

## 2020-11-26 LAB — SARS-COV-2, NAA 2 DAY TAT

## 2020-11-29 ENCOUNTER — Other Ambulatory Visit: Payer: Self-pay | Admitting: Obstetrics & Gynecology

## 2020-11-29 DIAGNOSIS — U071 COVID-19: Secondary | ICD-10-CM

## 2020-11-29 NOTE — Telephone Encounter (Signed)
Please advise 

## 2020-11-29 NOTE — Telephone Encounter (Signed)
Is she wanting the infusion?

## 2020-11-29 NOTE — Telephone Encounter (Signed)
Referral placed, see what else we need to do.  My understanding was that the Covid team upon receiving referral order reached out to pt.

## 2020-12-08 ENCOUNTER — Ambulatory Visit
Admission: RE | Admit: 2020-12-08 | Discharge: 2020-12-08 | Disposition: A | Discharge status: Home / Self Care | Payer: 59 | Source: Ambulatory Visit | Attending: Nurse Practitioner | Admitting: Nurse Practitioner

## 2020-12-08 ENCOUNTER — Encounter: Payer: Self-pay | Admitting: Nurse Practitioner

## 2020-12-08 ENCOUNTER — Ambulatory Visit
Admission: RE | Admit: 2020-12-08 | Discharge: 2020-12-08 | Disposition: A | Discharge status: Home / Self Care | Payer: 59 | Source: Home / Self Care | Attending: Nurse Practitioner | Admitting: Nurse Practitioner

## 2020-12-08 ENCOUNTER — Other Ambulatory Visit: Payer: Self-pay

## 2020-12-08 ENCOUNTER — Ambulatory Visit (INDEPENDENT_AMBULATORY_CARE_PROVIDER_SITE_OTHER): Payer: 59 | Admitting: Nurse Practitioner

## 2020-12-08 ENCOUNTER — Telehealth: Payer: Self-pay

## 2020-12-08 VITALS — BP 119/79 | HR 78 | Temp 99.0°F | Wt 129.2 lb

## 2020-12-08 DIAGNOSIS — M25472 Effusion, left ankle: Secondary | ICD-10-CM

## 2020-12-08 NOTE — Progress Notes (Signed)
BP 119/79   Pulse 78   Temp 99 F (37.2 C) (Oral)   Wt 129 lb 3.2 oz (58.6 kg)   SpO2 98%   BMI 19.08 kg/m    Subjective:    Patient ID: Erika Hurley, female    DOB: May 25, 1966, 55 y.o.   MRN: [id number]  HPI: Erika Hurley is a 55 y.o. female  Chief Complaint  Patient presents with  . Leg Swelling    Pt states she has noticed her left leg a little swollen for the last few days. States she had covid recently and is worried about a blood clot.    LEG PAIN Patient states that she has been having left ankle pain, towards the back of her ankle.  Patient denies any injury, calf pain, or SOB. Patient states that she had leg pain while she had COVID but that was an achy pain down the back of her legs.  Duration: days Pain: yes Severity: Patient does not have pain while sitting but does have pain with walking.  Quality:  sore Location:  lateral aspect of left foot. Bilateral:  no Onset: gradual Frequency: constant Time of  day:   when she is walking Sudden unintentional leg jerking:   no Paresthesias:   no Decreased sensation:  no Weakness:   no Insomnia:   no Fatigue:   no Alleviating factors: None Aggravating factors: walking makes it worse Status: stable Treatments attempted: None  Relevant past medical, surgical, family and social history reviewed and updated as indicated. Interim medical history since our last visit reviewed. Allergies and medications reviewed and updated.  Review of Systems  Musculoskeletal:       Pain with walking.  Left ankle pain and swelling.    Per HPI unless specifically indicated above     Objective:    BP 119/79   Pulse 78   Temp 99 F (37.2 C) (Oral)   Wt 129 lb 3.2 oz (58.6 kg)   SpO2 98%   BMI 19.08 kg/m   Wt Readings from Last 3 Encounters:  12/08/20 129 lb 3.2 oz (58.6 kg)  10/31/20 130 lb (59 kg)  09/12/20 131 lb (59.4 kg)    Physical Exam Vitals and nursing note reviewed.  Constitutional:      General:  She is not in acute distress.    Appearance: Normal appearance. She is normal weight. She is not ill-appearing, toxic-appearing or diaphoretic.  HENT:     Head: Normocephalic.     Right Ear: External ear normal.     Left Ear: External ear normal.     Nose: Nose normal.     Mouth/Throat:     Mouth: Mucous membranes are moist.     Pharynx: Oropharynx is clear.  Eyes:     General:        Right eye: No discharge.        Left eye: No discharge.     Extraocular Movements: Extraocular movements intact.     Conjunctiva/sclera: Conjunctivae normal.     Pupils: Pupils are equal, round, and reactive to light.  Cardiovascular:     Rate and Rhythm: Normal rate and regular rhythm.     Heart sounds: No murmur heard.   Pulmonary:     Effort: Pulmonary effort is normal. No respiratory distress.     Breath sounds: Normal breath sounds. No wheezing or rales.  Musculoskeletal:        General: Swelling present.     Cervical back:  Normal range of motion and neck supple.       Legs:  Skin:    General: Skin is warm and dry.     Capillary Refill: Capillary refill takes less than 2 seconds.  Neurological:     General: No focal deficit present.     Mental Status: She is alert and oriented to person, place, and time. Mental status is at baseline.  Psychiatric:        Mood and Affect: Mood normal.        Behavior: Behavior normal.        Thought Content: Thought content normal.        Judgment: Judgment normal.     Results for orders placed or performed in visit on 11/24/20  Novel Coronavirus, NAA ([facility])   Specimen: Nasopharyngeal(NP) swabs in vial transport medium   Nasopharynge  Result Value Ref Range   SARS-CoV-2, NAA Detected (A) Not Detected  SARS-COV-2, NAA 2 DAY TAT   Nasopharynge  Result Value Ref Range   SARS-CoV-2, NAA 2 DAY TAT Performed       Assessment & Plan:   Problem List Items Addressed This Visit   None   Visit Diagnoses    Left ankle swelling    -  Primary    Obtain xray.  Counseled patient on use of NSAIDS for pain.  Keep appointment with Orthopedics for tomorrow.  Will order [place] if necessary.    Relevant Orders   DG Ankle Complete Left       Follow up plan: Return if symptoms worsen or fail to improve.    More than 20 minutes spent with patient during visit.

## 2020-12-08 NOTE — Telephone Encounter (Signed)
Pt has been scheduled for today. Pt verbalized understanding.

## 2020-12-08 NOTE — Telephone Encounter (Signed)
I attempted to contact the patient several times to f/u on her mychart message, but no answer. I also sent the patient a mychart message. She was also contacted by [facility], but no answer.

## 2020-12-09 ENCOUNTER — Telehealth: Payer: Self-pay

## 2020-12-09 DIAGNOSIS — M7672 Peroneal tendinitis, left leg: Secondary | ICD-10-CM | POA: Insufficient documentation

## 2020-12-09 NOTE — Telephone Encounter (Signed)
The pt sent a mychart message to check on the status of her Xray results. I informed her that they have not resulted and that someone will call her with her results when they do.

## 2021-02-03 ENCOUNTER — Other Ambulatory Visit: Payer: Self-pay

## 2021-02-03 ENCOUNTER — Emergency Department
Admission: EM | Admit: 2021-02-03 | Discharge: 2021-02-03 | Disposition: A | Discharge status: Home / Self Care | Payer: 59 | Attending: Student in an Organized Health Care Education/Training Program | Admitting: Student in an Organized Health Care Education/Training Program

## 2021-02-03 ENCOUNTER — Emergency Department: Payer: 59

## 2021-02-03 DIAGNOSIS — R1013 Epigastric pain: Secondary | ICD-10-CM | POA: Diagnosis present

## 2021-02-03 LAB — COMPREHENSIVE METABOLIC PANEL
ALT: 19 U/L (ref 0–44)
AST: 24 U/L (ref 15–41)
Albumin: 5 g/dL (ref 3.5–5.0)
Alkaline Phosphatase: 71 U/L (ref 38–126)
Anion gap: 10 (ref 5–15)
BUN: 17 mg/dL (ref 6–20)
CO2: 26 mmol/L (ref 22–32)
Calcium: 9.8 mg/dL (ref 8.9–10.3)
Chloride: 105 mmol/L (ref 98–111)
Creatinine, Ser: 0.81 mg/dL (ref 0.44–1.00)
GFR, Estimated: >60 mL/min (ref >60)
Glucose, Bld: 139 mg/dL — ABNORMAL HIGH (ref 70–99)
Potassium: 4.6 mmol/L (ref 3.5–5.1)
Sodium: 141 mmol/L (ref 135–145)
Total Bilirubin: 0.7 mg/dL (ref 0.3–1.2)
Total Protein: 8 g/dL (ref 6.5–8.1)

## 2021-02-03 LAB — CBC
HCT: 37.1 % (ref 36.0–46.0)
Hemoglobin: 12.6 g/dL (ref 12.0–15.0)
MCH: 31.4 pg (ref 26.0–34.0)
MCHC: 34 g/dL (ref 30.0–36.0)
MCV: 92.5 fL (ref 80.0–100.0)
Platelets: 354 10*3/uL (ref 150–400)
RBC: 4.01 MIL/uL (ref 3.87–5.11)
RDW: 12 % (ref 11.5–15.5)
WBC: 7.7 10*3/uL (ref 4.0–10.5)
nRBC: 0 % (ref 0.0–0.2)

## 2021-02-03 LAB — URINALYSIS, COMPLETE (UACMP) WITH MICROSCOPIC
Bacteria, UA: NONE SEEN
Bilirubin Urine: NEGATIVE
Glucose, UA: NEGATIVE mg/dL
Ketones, ur: NEGATIVE mg/dL
Leukocytes,Ua: NEGATIVE
Nitrite: NEGATIVE
Protein, ur: NEGATIVE mg/dL
Specific Gravity, Urine: 1.009 (ref 1.005–1.030)
Squamous Epithelial / HPF: NONE SEEN (ref 0–5)
pH: 6 (ref 5.0–8.0)

## 2021-02-03 LAB — LIPASE, BLOOD: Lipase: 41 U/L (ref 11–51)

## 2021-02-03 NOTE — ED Provider Notes (Signed)
[facility] Emergency Department Provider Note    Event Date/Time   First MD Initiated Contact with Patient 02/03/21 2130     (approximate)  I have reviewed the triage vital signs and the nursing notes.   HISTORY  Chief Complaint Abdominal Pain    HPI Erika Hurley is a 55 y.o. female below listed past medical history presents to the ER for evaluation of midepigastric abdominal pain radiating to her flank starting today.  Denied any chest pain or shortness of breath.  Is never had pain like this before.  No associated nausea or vomiting.  Denies any history of pancreatitis no history of biliary issues.  No recent surgeries.  No fevers.    Past Medical History:  Diagnosis Date  . Arthritis   . Cervical dysplasia   . History of abnormal cervical Pap smear    HGSIL  . Migraines   . Solitary cyst of breast   . Stroke [facility])    Family History  Problem Relation Age of Onset  . Cancer Mother   . Hypertension Mother   . Hyperlipidemia Mother   . Kidney disease Mother   . Breast cancer Mother 58       twice  . Stroke Father   . Heart disease Father   . Hypertension Father   . Hyperlipidemia Father   . Kidney disease Father   . Cancer Maternal Grandfather    Past Surgical History:  Procedure Laterality Date  . BREAST CYST ASPIRATION Right 2005?      Marland Kitchen LEEP  2004  . spinal tap  2012   Patient Active Problem List   Diagnosis Date Noted  . Annual physical exam 09/12/2020  . Hematuria 09/12/2020  . Tachycardia 03/01/2020  . Encounter to establish care with new doctor 03/01/2020  . History of cervical dysplasia 10/03/2017  . 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 Thrush of mouth and esophagus ([place]) 01/09/2016  . Need for Tdap vaccination 09/21/2015  . Hyperlipidemia 09/21/2015  . Encounter for immunization 09/15/2015  . Iritis 10/12/2014  . Hot flashes 09/06/2014  . History of CVA (cerebrovascular accident) 09/06/2014  . Hydrocephalus, adult  ([place]) 09/06/2014  . Hx of migraines 09/06/2014  . Stroke ([place]) 06/11/2013  . Thrombosed external hemorrhoids 03/02/2013      Prior to Admission medications   Medication Sig Start Date End Date Taking? Authorizing Provider  acyclovir (ZOVIRAX) 200 MG capsule acyclovir 200 mg capsule daily for suppression Patient not taking: Reported on 12/08/2020 10/28/19   Gae Dry, MD  cyclobenzaprine (FLEXERIL) 10 MG tablet Take 10 mg by mouth at bedtime.    [provider]  hydrocortisone-pramoxine [facility]) 2.5-1 % rectal cream Place rectally 4 (four) times daily. For irritated and painful hemorrhoids Patient not taking: Reported on 12/08/2020 03/02/13   Autumn Messing III, MD  Melatonin 3 MG TABS Take 10 mg by mouth.     [provider]  metoprolol tartrate (LOPRESSOR) 25 MG tablet Take 12.5 mg by mouth as needed.  Patient not taking: Reported on 12/08/2020 03/04/20 03/04/21  [provider]  Multiple Vitamins-Minerals (ZINC PO) Take by mouth daily. Patient not taking: Reported on 12/08/2020    [provider]  rosuvastatin (CRESTOR) 5 MG tablet Take 1 tablet (5 mg total) by mouth daily. Patient not taking: Reported on 12/08/2020 04/23/16   Coral Spikes, DO  SUMAtriptan (IMITREX) 50 MG tablet TAKE ONE TABLET BY MOUTH EVERY TWO HOURS AS NEEDED FOR MIGRAINE OR  HEADACHE. MAY REPEAT IN TWO HOURS IF HEADACHE PERSISTS OR RECURS Patient not taking: Reported on 12/08/2020 11/02/20   Gae Dry, MD    Allergies Sulfa antibiotics    Social History Social History   Tobacco Use  . Smoking status: Never Smoker  . Smokeless tobacco: Never Used  Vaping Use  . Vaping Use: Never used  Substance Use Topics  . Alcohol use: Yes    Alcohol/week: 2.0 standard drinks    Types: 2 Glasses of wine per week  . Drug use: No    Review of Systems Patient denies headaches, rhinorrhea, blurry vision, numbness, shortness of breath, chest pain, edema, cough, abdominal pain,  nausea, vomiting, diarrhea, dysuria, fevers, rashes or hallucinations unless otherwise stated above in HPI. ____________________________________________   PHYSICAL EXAM:  VITAL SIGNS: Vitals:   02/03/21 1833 02/03/21 2054  BP: (!) 171/79 (!) 143/87  Pulse: 86 72  Resp: 18 12  Temp: 98.3 F (36.8 C)   SpO2: 100% 98%    Constitutional: Alert and oriented.  Eyes: Conjunctivae are normal.  Head: Atraumatic. Nose: No congestion/rhinnorhea. Mouth/Throat: Mucous membranes are moist.   Neck: No stridor. Painless ROM.  Cardiovascular: Normal rate, regular rhythm. Grossly normal heart sounds.  Good peripheral circulation. Respiratory: Normal respiratory effort.  No retractions. Lungs CTAB. Gastrointestinal: Soft and nontender. No distention. No abdominal bruits. No CVA tenderness. Genitourinary:  Musculoskeletal: No lower extremity tenderness nor edema.  No joint effusions. Neurologic:  Normal speech and language. No gross focal neurologic deficits are appreciated. No facial droop Skin:  Skin is warm, dry and intact. No rash noted. Psychiatric: Mood and affect are normal. Speech and behavior are normal.  ____________________________________________   LABS (all labs ordered are listed, but only abnormal results are displayed)  Results for orders placed or performed during the hospital encounter of 02/03/21 (from the past 24 hour(s))  Lipase, blood     Status: None   Collection Time: 02/03/21  6:36 PM  Result Value Ref Range   Lipase 41 11 - 51 U/L  Comprehensive metabolic panel     Status: Abnormal   Collection Time: 02/03/21  6:36 PM  Result Value Ref Range   Sodium 141 135 - 145 mmol/L   Potassium 4.6 3.5 - 5.1 mmol/L   Chloride 105 98 - 111 mmol/L   CO2 26 22 - 32 mmol/L   Glucose, Bld 139 (H) 70 - 99 mg/dL   BUN 17 6 - 20 mg/dL   Creatinine, Ser 0.81 0.44 - 1.00 mg/dL   Calcium 9.8 8.9 - 10.3 mg/dL   Total Protein 8.0 6.5 - 8.1 g/dL   Albumin 5.0 3.5 - 5.0 g/dL   AST  24 15 - 41 U/L   ALT 19 0 - 44 U/L   Alkaline Phosphatase 71 38 - 126 U/L   Total Bilirubin 0.7 0.3 - 1.2 mg/dL   GFR, Estimated >60 >60 mL/min   Anion gap 10 5 - 15  CBC     Status: None   Collection Time: 02/03/21  6:36 PM  Result Value Ref Range   WBC 7.7 4.0 - 10.5 K/uL   RBC 4.01 3.87 - 5.11 MIL/uL   Hemoglobin 12.6 12.0 - 15.0 g/dL   HCT 37.1 36.0 - 46.0 %   MCV 92.5 80.0 - 100.0 fL   MCH 31.4 26.0 - 34.0 pg   MCHC 34.0 30.0 - 36.0 g/dL   RDW 12.0 11.5 - 15.5 %   Platelets 354 150 - 400 K/uL  nRBC 0.0 0.0 - 0.2 %  Urinalysis, Complete w Microscopic Urine, Clean Catch     Status: Abnormal   Collection Time: 02/03/21  6:37 PM  Result Value Ref Range   Color, Urine STRAW (A) YELLOW   APPearance CLEAR (A) CLEAR   Specific Gravity, Urine 1.009 1.005 - 1.030   pH 6.0 5.0 - 8.0   Glucose, UA NEGATIVE NEGATIVE mg/dL   Hgb urine dipstick MODERATE (A) NEGATIVE   Bilirubin Urine NEGATIVE NEGATIVE   Ketones, ur NEGATIVE NEGATIVE mg/dL   Protein, ur NEGATIVE NEGATIVE mg/dL   Nitrite NEGATIVE NEGATIVE   Leukocytes,Ua NEGATIVE NEGATIVE   RBC / HPF 0-5 0 - 5 RBC/hpf   WBC, UA 0-5 0 - 5 WBC/hpf   Bacteria, UA NONE SEEN NONE SEEN   Squamous Epithelial / LPF NONE SEEN 0 - 5   ____________________________________________  EKG My review and personal interpretation at Time: 18:44   Indication: epigastric pain  Rate: 75  Rhythm: sinus Axis: normal Other: normal intervals, no stemi, no depressions ____________________________________________  RADIOLOGY  I personally reviewed all radiographic images ordered to evaluate for the above acute complaints and reviewed radiology reports and findings.  These findings were personally discussed with the patient.  Please see medical record for radiology report.  ____________________________________________   PROCEDURES  Procedure(s) performed:  Procedures    Critical Care performed:  no ____________________________________________   INITIAL IMPRESSION / ASSESSMENT AND PLAN / ED COURSE  Pertinent labs & imaging results that were available during my care of the patient were reviewed by me and considered in my medical decision making (see chart for details).   DDX: Gastritis, enteritis, but biliary pathology, pancreatitis, stone, [place]  Marvina ZENAYA ULATOWSKI is a 55 y.o. who presents to the ED with presentation as described above.  Patient clinically very well-appearing.  Has a benign abdominal exam.  Urine does have hemoglobin however given the back pain will order CT renal to rule out stone.  Does not seem consistent with ACS no chest pain or pressure no shortness of breath.  Given lack of white count or LFT abnormality do not feel the ultrasound clinically indicated at this time she does not have any right upper quadrant tenderness to palpation.  CT imaging does not show any evidence of acute intra-abdominal process no sign of AAA.  Given reassuring blood work and reassuring exam do feel patient is appropriate for close outpatient follow-up.  Patient agreeable to plan.  Discussed strict return precautions.     The patient was evaluated in Emergency Department today for the symptoms described in the history of present illness. He/she was evaluated in the context of the global COVID-19 pandemic, which necessitated consideration that the patient might be at risk for infection with the SARS-CoV-2 virus that causes COVID-19. Institutional protocols and algorithms that pertain to the evaluation of patients at risk for COVID-19 are in a state of rapid change based on information released by regulatory bodies including the CDC and federal and state organizations. These policies and algorithms were followed during the patient's care in the ED.  As part of my medical decision making, I reviewed the following data within the [facility] notes reviewed and incorporated,  Labs reviewed, notes from prior ED visits and [place] Controlled Substance Database   ____________________________________________   FINAL CLINICAL IMPRESSION(S) / ED DIAGNOSES  Final diagnoses:  Epigastric pain      NEW MEDICATIONS STARTED DURING THIS VISIT:  Discharge Medication List as of 02/03/2021  10:30 PM       Note:  This document was prepared using Dragon voice recognition software and may include unintentional dictation errors.    Merlyn Lot, MD 02/03/21 2330

## 2021-02-03 NOTE — ED Notes (Signed)
Pt states 'if my bp is good I will just leave,' encouraged pt to stay to get evaluated now that she is in a room and blood work is already completed 'I work for [employer] and I know how this works can you just order the ultrasound to get things done,' notified pt provider has to evaluate pt to order testing and will be in to see pt as soon as able. Pt voiced understanding

## 2021-02-03 NOTE — ED Notes (Signed)
Triage nurse reports pt was eating and states 'feels better.' pt ambulatory to room

## 2021-02-03 NOTE — Discharge Instructions (Addendum)

## 2021-02-03 NOTE — ED Triage Notes (Signed)
Pt to ER from [facility] with complaints of epigastric pain that radiates into back.   Pt reports waking up and not feeling well. Pain is worst with ambulation. Denies vomiting, states has been nauseated all day, has had 3 bowel movements today which is unusual for her. No urinary symptoms. Denies fevers at home. States her BP was elevated at [facility] and has never had issues with HTN before.

## 2021-02-17 ENCOUNTER — Ambulatory Visit: Payer: Self-pay | Admitting: *Deleted

## 2021-02-17 NOTE — Telephone Encounter (Signed)
  C/o worsening abdominal cramping and pain since Tuesday . Was seen in ED on 02/03/21 . C/o headaches and low grade fever 99. Has noticed weight loss. Pain worse when standing up straight. No available virtual visits today from PCP. Instructed patient to go to ED. Care advise given. Patient verbalized understanding of care advise and to go to ED for worsening symptoms .   Reason for Disposition . [1] MILD-MODERATE pain AND [2] constant AND [3] present > 2 hours  Answer Assessment - Initial Assessment Questions 1. LOCATION: "Where does it hurt?"      Lower abdomen 2. RADIATION: "Does the pain shoot anywhere else?" (e.g., chest, back)     na 3. ONSET: "When did the pain begin?" (e.g., minutes, hours or days ago)      Stated back again on Tuesday  4. SUDDEN: "Gradual or sudden onset?"     Sudden  5. PATTERN "Does the pain come and go, or is it constant?"    - If constant: "Is it getting better, staying the same, or worsening?"      (Note: Constant means the pain never goes away completely; most serious pain is constant and it progresses)     - If intermittent: "How long does it last?" "Do you have pain now?"     (Note: Intermittent means the pain goes away completely between bouts)     Domes and goes and having difficulty standing up straight 6. SEVERITY: "How bad is the pain?"  (e.g., Scale 1-10; mild, moderate, or severe)   - MILD (1-3): doesn't interfere with normal activities, abdomen soft and not tender to touch    - MODERATE (4-7): interferes with normal activities or awakens from sleep, tender to touch    - SEVERE (8-10): excruciating pain, doubled over, unable to do any normal activities      Moderate to severe 7. RECURRENT SYMPTOM: "Have you ever had this type of stomach pain before?" If Yes, ask: "When was the last time?" and "What happened that time?"      Yes  8. CAUSE: "What do you think is causing the stomach pain?"     Does not know 9. RELIEVING/AGGRAVATING FACTORS: "What  makes it better or worse?" (e.g., movement, antacids, bowel movement)     Nothing makes it better  10. OTHER SYMPTOMS: "Has there been any vomiting, diarrhea, constipation, or urine problems?"       Urgency for bowels to move. Low grade temp. Headaches and weight loss 11. PREGNANCY: "Is there any chance you are pregnant?" "When was your last menstrual period?"       na  Protocols used: ABDOMINAL PAIN - [facility]

## 2021-02-20 ENCOUNTER — Ambulatory Visit (INDEPENDENT_AMBULATORY_CARE_PROVIDER_SITE_OTHER): Payer: 59 | Admitting: Family Medicine

## 2021-02-20 ENCOUNTER — Other Ambulatory Visit: Payer: Self-pay

## 2021-02-20 ENCOUNTER — Encounter: Payer: Self-pay | Admitting: Family Medicine

## 2021-02-20 VITALS — BP 104/57 | HR 77 | Ht 69.0 in | Wt 127.0 lb

## 2021-02-20 DIAGNOSIS — R1013 Epigastric pain: Secondary | ICD-10-CM | POA: Diagnosis not present

## 2021-02-20 DIAGNOSIS — R109 Unspecified abdominal pain: Secondary | ICD-10-CM

## 2021-02-20 DIAGNOSIS — R1084 Generalized abdominal pain: Secondary | ICD-10-CM

## 2021-02-20 MED ORDER — DICYCLOMINE HCL 10 MG PO CAPS: 10.0000 mg | ORAL_CAPSULE | Freq: Three times a day (TID) | ORAL | 1 refills | Status: DC

## 2021-02-20 MED ORDER — SUCRALFATE 1 GM/10ML PO SUSP: 1.0000 g | Freq: Three times a day (TID) | ORAL | 0 refills | Status: DC

## 2021-02-20 NOTE — Progress Notes (Signed)
Subjective:    Patient ID: Erika Hurley, female    DOB: 08-Jul-1966, 55 y.o.   MRN: [id number]  Erika Hurley is a 55 y.o. female presenting on 02/20/2021 for Abdominal Pain   HPI   Abdominal Pain / Cramping, episodes  4/1 [facility], took her to [facility] ED  4/1 [facility] ED - had labs CMET, CBC (no elevated WBC), Lipase (negative), Urinalysis (blood in urine), Urine Culture, EKG, CT Renal Stone Study, she took Nexium OTC for a few days since that time.  Eventually it did improve.  02/17/21 [facility] ED visit - she had abdominal pain lower abdomen, sore to touch, they did labs and RUQ  She has had regular BMs.  She describes symptoms since Tues to Friday, would get abdominal cramps lower region, every time she would eat or drink, or even have episodes when she was not eating/drinking. She was given rx Dicyclomine PRN since that last ED visit.  Not taking anti inflammatories ASA, NSAIDs.  Now improved symptoms after several days. Unsure if Dicyclomine.  Denies fever. But she had felt face warm to the touch. Also BP was elevated up to 160/90 at the time, she tried Clonidine (used for hot flashes)  Last Colonoscopy 12/06/14, negative no polyps, good for 10 years, next due in 2026.  Additional history she had atypical tachycardia, had a holter monitor.  No history of abdominal surgeries - still has gallbladder, appendix.  Past Surgical History:  Procedure Laterality Date  . BREAST CYST ASPIRATION Right 2005?      Marland Kitchen LEEP  2004  . spinal tap  2012    Depression screen [facility] 2/9 09/12/2020 03/01/2020  Decreased Interest 1 0  Down, Depressed, Hopeless 1 0  PHQ - 2 Score 2 0  Altered sleeping 1 -  Tired, decreased energy 1 -  Change in appetite 0 -  Feeling bad or failure about yourself  0 -  Trouble concentrating 0 -  Moving slowly or fidgety/restless 0 -  Suicidal thoughts 0 -  PHQ-9 Score 4 -  Difficult doing work/chores Not difficult at all -    Social History    Tobacco Use  . Smoking status: Never Smoker  . Smokeless tobacco: Never Used  Vaping Use  . Vaping Use: Never used  Substance Use Topics  . Alcohol use: Yes    Alcohol/week: 2.0 standard drinks    Types: 2 Glasses of wine per week  . Drug use: No    Review of Systems Per HPI unless specifically indicated above     Objective:    BP (!) 104/57   Pulse 77   Ht 5\' 9"  (1.753 m)   Wt 127 lb (57.6 kg)   SpO2 100%   BMI 18.75 kg/m   Wt Readings from Last 3 Encounters:  02/20/21 127 lb (57.6 kg)  02/03/21 125 lb (56.7 kg)  12/08/20 129 lb 3.2 oz (58.6 kg)    Physical Exam Vitals and nursing note reviewed.  Constitutional:      General: She is not in acute distress.    Appearance: She is well-developed. She is not diaphoretic.     Comments: Well-appearing, comfortable, cooperative  HENT:     Head: Normocephalic and atraumatic.  Eyes:     General:        Right eye: No discharge.        Left eye: No discharge.     Conjunctiva/sclera: Conjunctivae normal.  Neck:     Thyroid:  No thyromegaly.  Cardiovascular:     Rate and Rhythm: Normal rate and regular rhythm.     Heart sounds: Normal heart sounds. No murmur heard.   Pulmonary:     Effort: Pulmonary effort is normal. No respiratory distress.     Breath sounds: Normal breath sounds. No wheezing or rales.  Abdominal:     General: Bowel sounds are normal. There is no distension.     Palpations: Abdomen is soft.     Tenderness: There is no abdominal tenderness. There is no right CVA tenderness, left CVA tenderness, guarding or rebound. Negative signs include Murphy's sign and McBurney's sign.  Musculoskeletal:        General: Normal range of motion.     Cervical back: Normal range of motion and neck supple.  Lymphadenopathy:     Cervical: No cervical adenopathy.  Skin:    General: Skin is warm and dry.     Findings: No erythema or rash.  Neurological:     Mental Status: She is alert and oriented to person, place,  and time.  Psychiatric:        Behavior: Behavior normal.     Comments: Well groomed, good eye contact, normal speech and thoughts      I have personally reviewed the radiology report from 02/03/21 on CT Renal Stone Study.  CT Renal Stone StudyPerformed 02/03/2021 Final result  Study Result CLINICAL DATA: Epigastric pain radiating to back. Flank pain.  EXAM: CT ABDOMEN AND PELVIS WITHOUT CONTRAST  TECHNIQUE: Multidetector CT imaging of the abdomen and pelvis was performed following the standard protocol without IV contrast.  COMPARISON: None.  FINDINGS: Lower chest: Lung bases are clear. No effusions. Heart is normal size.  Hepatobiliary: No focal hepatic abnormality. Gallbladder unremarkable.  Pancreas: No focal abnormality or ductal dilatation.  Spleen: No focal abnormality. Normal size.  Adrenals/Urinary Tract: No adrenal abnormality. No focal renal abnormality. No stones or hydronephrosis. Urinary bladder is unremarkable.  Stomach/Bowel: Normal appendix. Stomach, large and small bowel grossly unremarkable.  Vascular/Lymphatic: No evidence of aneurysm or adenopathy.  Reproductive: Uterus and adnexa unremarkable. No mass.  Other: No free fluid or free air.  Musculoskeletal: No acute bony abnormality.  IMPRESSION: No renal or ureteral stones. No hydronephrosis.  No acute findings in the abdomen or pelvis.   Electronically Signed By: Rolm Baptise M.D. On: 02/03/2021 22:08   I have personally reviewed the radiology report from 02/17/21 on US Gallbladder.  US gallbladder  Anatomical Region Laterality Modality  Abdomen -- Ultrasound  Pelvis -- --  Liver -- --    Narrative Performed by [facility] Procedure: US GALLBLADDER   Indication: abd pain, R10.13 Epigastric pain   Comparison: None   Technique: Grayscale and color Doppler ultrasound of the right upper  quadrant was performed.   Findings:  Gallbladder:    Intraluminal contents:  No stones or sludge.    Wall thickness: 1 mm. This is within normal limits.    Distention: Not abnormally distended.    Pericholecystic fluid: No pericholecystic fluid.    Sonographic Murphy's sign is negative.   Bile Ducts: No intrahepatic or extrahepatic ductal dilation    Common bile duct diameter: 3 mm.   Liver: Normal echogenicity. No focal mass.   Pancreas: Visualized portions are within normal limits.   Kidneys: Right kidney measures 9.8 cm. No hydronephrosis.    Fluid: None.   Impression:  No cholelithiasis or evidence of acute cholecystitis.   Electronically Reviewed by: Marin Roberts, MD, [facility]  Electronically Reviewed on: 02/18/2021 11:20 AM   I have reviewed the images and concur with the above findings.   Electronically Signed by: Alfredo Batty, MD, [facility]  Electronically Signed on: 02/18/2021 1:28 PM  Procedure Note  Alfredo Batty, MD - 02/18/2021  Formatting of this note might be different from the original.  Procedure: US GALLBLADDER   Indication: abd pain, R10.13 Epigastric pain   Comparison: None   Technique: Grayscale and color Doppler ultrasound of the right upper  quadrant was performed.   Findings:  Gallbladder:    Intraluminal contents: No stones or sludge.    Wall thickness: 1 mm. This is within normal limits.    Distention: Not abnormally distended.    Pericholecystic fluid: No pericholecystic fluid.    Sonographic Murphy's sign is negative.   Bile Ducts: No intrahepatic or extrahepatic ductal dilation    Common bile duct diameter: 3 mm.   Liver: Normal echogenicity. No focal mass.   Pancreas: Visualized portions are within normal limits.   Kidneys: Right kidney measures 9.8 cm. No hydronephrosis.    Fluid: None.   Impression:  No cholelithiasis or evidence of acute cholecystitis.   Electronically Reviewed by: Marin Roberts, MD, [facility]  Electronically Reviewedon:  02/18/2021 11:20 AM   I have reviewed the images and concur with the above findings.   Electronically Signed by: Alfredo Batty, MD, [facility]  Electronically Signed on: 02/18/2021 1:28 PM Exam End: 02/17/21 7:25 PM   Specimen Collected: 02/17/21 7:12 PM Last Resulted: 02/18/21 1:28 PM  Received From: [facility]  Result Received: 02/20/21 8:16 AM    Results for orders placed or performed during the hospital encounter of 02/03/21  Lipase, blood  Result Value Ref Range   Lipase 41 11 - 51 U/L  Comprehensive metabolic panel  Result Value Ref Range   Sodium 141 135 - 145 mmol/L   Potassium 4.6 3.5 - 5.1 mmol/L   Chloride 105 98 - 111 mmol/L   CO2 26 22 - 32 mmol/L   Glucose, Bld 139 (H) 70 - 99 mg/dL   BUN 17 6 - 20 mg/dL   Creatinine, Ser 0.81 0.44 - 1.00 mg/dL   Calcium 9.8 8.9 - 10.3 mg/dL   Total Protein 8.0 6.5 - 8.1 g/dL   Albumin 5.0 3.5 - 5.0 g/dL   AST 24 15 - 41 U/L   ALT 19 0 - 44 U/L   Alkaline Phosphatase 71 38 - 126 U/L   Total Bilirubin 0.7 0.3 - 1.2 mg/dL   GFR, Estimated >60 >60 mL/min   Anion gap 10 5 - 15  CBC  Result Value Ref Range   WBC 7.7 4.0 - 10.5 K/uL   RBC 4.01 3.87 - 5.11 MIL/uL   Hemoglobin 12.6 12.0 - 15.0 g/dL   HCT 37.1 36.0 - 46.0 %   MCV 92.5 80.0 - 100.0 fL   MCH 31.4 26.0 - 34.0 pg   MCHC 34.0 30.0 - 36.0 g/dL   RDW 12.0 11.5 - 15.5 %   Platelets 354 150 - 400 K/uL   nRBC 0.0 0.0 - 0.2 %  Urinalysis, Complete w Microscopic Urine, Clean Catch  Result Value Ref Range   Color, Urine STRAW (A) YELLOW   APPearance CLEAR (A) CLEAR   Specific Gravity, Urine 1.009 1.005 - 1.030   pH 6.0 5.0 - 8.0   Glucose, UA NEGATIVE NEGATIVE mg/dL   Hgb urine dipstick MODERATE (A) NEGATIVE   Bilirubin Urine NEGATIVE  NEGATIVE   Ketones, ur NEGATIVE NEGATIVE mg/dL   Protein, ur NEGATIVE NEGATIVE mg/dL   Nitrite NEGATIVE NEGATIVE   Leukocytes,Ua NEGATIVE NEGATIVE   RBC / HPF 0-5 0 - 5 RBC/hpf   WBC, UA 0-5 0 - 5 WBC/hpf    Bacteria, UA NONE SEEN NONE SEEN   Squamous Epithelial / LPF NONE SEEN 0 - 5      Assessment & Plan:   Problem List Items Addressed This Visit   None   Visit Diagnoses    Abdominal cramping    -  Primary   Relevant Medications   dicyclomine (BENTYL) 10 MG capsule   Epigastric abdominal pain       Relevant Medications   sucralfate (CARAFATE) 1 GM/10ML suspension   Generalized abdominal pain       Relevant Medications   sucralfate (CARAFATE) 1 GM/10ML suspension     Improved today, now virtually asymptomatic. Uncertain exact etiology of her episodic abdominal cramping/pain - associated with meals   Reviewed UC/ED visits and testing and imaging. Including CT Renal Stone / RUQ Abd [place] - no confirmed nephrolithiasis and no cholelithiasis. - Lipase was normal unlikely pancreatitis. Imaging showed normal appendix. And symptoms not consistent. - Bowel habits were fairly regular, does not seem as likely to be diverticulitis or colitis - but cannot rule this out, seems most likely given symptoms. Was not constipated. - Also can consider GERD PUD more functional GI symptoms  Trial on more regular PPI dosing, Nexium 20mg  OTC daily for 2+ weeks to see if effective - Rx PRN Carafate to use as well determine if beneficial with meals - Re order Dicyclomine PRN since helpful for cramping  Offer GI referral / consult with [facility] if interested, can contact us back for referral if problem persists.  Meds ordered this encounter  Medications  . dicyclomine (BENTYL) 10 MG capsule    Sig: Take 1 capsule (10 mg total) by mouth 4 (four) times daily -  before meals and at bedtime.    Dispense:  40 capsule    Refill:  1  . sucralfate (CARAFATE) 1 GM/10ML suspension    Sig: Take 10 mLs (1 g total) by mouth 4 (four) times daily -  with meals and at bedtime.    Dispense:  420 mL    Refill:  0     Follow up plan: Return if symptoms worsen or fail to improve.   Nobie Putnam, [facility] 02/20/2021, 2:55 PM

## 2021-02-20 NOTE — Patient Instructions (Addendum)
Thank you for coming to the office today.  Keep on OTC Nexium 20mg  daily - 30 min before first meal of the day, for 2 weeks maybe longer if beneficial.  Keep an eye on the lettuce intake and if this could have caused symptoms.  [facility]) [place], [place] [address] Phone: [phone]  [facility]) [place]. [place], [place] [address] Main: [phone]   Please schedule a Follow-up Appointment to: Return if symptoms worsen or fail to improve.  If you have any other questions or concerns, please feel free to call the office or send a message through [place]. You may also schedule an earlier appointment if necessary.  Additionally, you may be receiving a survey about your experience at our office within a few days to 1 week by e-mail or mail. We value your feedback.  Nobie Putnam, DO [place]

## 2021-04-10 ENCOUNTER — Encounter: Payer: Self-pay | Admitting: Family Medicine

## 2021-04-10 ENCOUNTER — Ambulatory Visit (INDEPENDENT_AMBULATORY_CARE_PROVIDER_SITE_OTHER): Payer: 59 | Admitting: Family Medicine

## 2021-04-10 ENCOUNTER — Other Ambulatory Visit: Payer: Self-pay

## 2021-04-10 VITALS — BP 116/73 | Ht 69.0 in | Wt 125.0 lb

## 2021-04-10 DIAGNOSIS — F411 Generalized anxiety disorder: Secondary | ICD-10-CM | POA: Diagnosis not present

## 2021-04-10 DIAGNOSIS — R202 Paresthesia of skin: Secondary | ICD-10-CM

## 2021-04-10 DIAGNOSIS — M79601 Pain in right arm: Secondary | ICD-10-CM | POA: Diagnosis not present

## 2021-04-10 DIAGNOSIS — F5104 Psychophysiologic insomnia: Secondary | ICD-10-CM | POA: Diagnosis not present

## 2021-04-10 DIAGNOSIS — M79602 Pain in left arm: Secondary | ICD-10-CM

## 2021-04-10 MED ORDER — BUSPIRONE HCL 7.5 MG PO TABS: 7.5000 mg | ORAL_TABLET | Freq: Two times a day (BID) | ORAL | 2 refills | Status: DC | PRN

## 2021-04-10 MED ORDER — GABAPENTIN 100 MG PO CAPS: ORAL_CAPSULE | ORAL | 1 refills | Status: DC

## 2021-04-10 NOTE — Progress Notes (Signed)
Subjective:    Patient ID: Erika Hurley, female    DOB: 11-Mar-1966, 55 y.o.   MRN: [id number]  Erika Hurley is a 55 y.o. female presenting on 04/10/2021 for Insomnia   HPI    Generalized Anxiety Disorder Insomnia History of panic attacks in past, she was on Buspar in past. >2 years ago pre COVID. She describes in past was feeling sleepy when she took it during day, she took it later in evening and did better.  She admits feeling "nerves crawling" mostly in both arms upper arm. Occurs every day, with itchy irritated arms. It can last up to 5 min at a time. Unrelated to activity with arms or sleeping or position. Hypersensitivity to skin and touch on arms Uses lotion on arms sometimes to help.  Dermatology Reports one skin spot on R hand dorsal region, raised lesion that has healed and now just has a dry lesion, healing. She has routine yearly dermatology apt in the Fall, she will see them.  HTN CKD Needs refills on BP meds Taking Losartan and HCTZ He is urinating well without problem Tries to stay hydrated Last lab 07/2020  Depression screen [facility] 2/9 04/10/2021 09/12/2020 03/01/2020  Decreased Interest 1 1 0  Down, Depressed, Hopeless 0 1 0  PHQ - 2 Score 1 2 0  Altered sleeping 3 1 -  Tired, decreased energy 0 1 -  Change in appetite 0 0 -  Feeling bad or failure about yourself  0 0 -  Trouble concentrating 1 0 -  Moving slowly or fidgety/restless 0 0 -  Suicidal thoughts 0 0 -  PHQ-9 Score 5 4 -  Difficult doing work/chores Not difficult at all Not difficult at all -   GAD 7 : Generalized Anxiety Score 04/10/2021 09/12/2020  Nervous, Anxious, on Edge 2 1  Control/stop worrying 1 1  Worry too much - different things 1 1  Trouble relaxing 1 2  Restless 2 0  Easily annoyed or irritable 0 1  Afraid - awful might happen 0 0  Total GAD 7 Score 7 6  Anxiety Difficulty - Not difficult at all     Social History   Tobacco Use  . Smoking status: Never Smoker  .  Smokeless tobacco: Never Used  Vaping Use  . Vaping Use: Never used  Substance Use Topics  . Alcohol use: Yes    Alcohol/week: 2.0 standard drinks    Types: 2 Glasses of wine per week  . Drug use: No    Review of Systems Per HPI unless specifically indicated above     Objective:    BP 116/73   Ht 5\' 9"  (1.753 m)   Wt 125 lb (56.7 kg)   SpO2 100%   BMI 18.46 kg/m   Wt Readings from Last 3 Encounters:  04/10/21 125 lb (56.7 kg)  02/20/21 127 lb (57.6 kg)  02/03/21 125 lb (56.7 kg)    Physical Exam Vitals and nursing note reviewed.  Constitutional:      General: She is not in acute distress.    Appearance: She is well-developed. She is not diaphoretic.     Comments: Well-appearing, comfortable, cooperative  HENT:     Head: Normocephalic and atraumatic.  Eyes:     General:        Right eye: No discharge.        Left eye: No discharge.     Conjunctiva/sclera: Conjunctivae normal.  Cardiovascular:     Rate and Rhythm:  Normal rate.  Pulmonary:     Effort: Pulmonary effort is normal.  Musculoskeletal:        General: Normal range of motion.     Right lower leg: No edema.     Left lower leg: No edema.  Skin:    General: Skin is warm and dry.     Findings: No erythema or rash.  Neurological:     General: No focal deficit present.     Mental Status: She is alert and oriented to person, place, and time. Mental status is at baseline.     Sensory: No sensory deficit.     Motor: No weakness.  Psychiatric:        Behavior: Behavior normal.     Comments: Well groomed, good eye contact, normal speech and thoughts    Results for orders placed or performed during the hospital encounter of 02/03/21  Lipase, blood  Result Value Ref Range   Lipase 41 11 - 51 U/L  Comprehensive metabolic panel  Result Value Ref Range   Sodium 141 135 - 145 mmol/L   Potassium 4.6 3.5 - 5.1 mmol/L   Chloride 105 98 - 111 mmol/L   CO2 26 22 - 32 mmol/L   Glucose, Bld 139 (H) 70 - 99 mg/dL    BUN 17 6 - 20 mg/dL   Creatinine, Ser 0.81 0.44 - 1.00 mg/dL   Calcium 9.8 8.9 - 10.3 mg/dL   Total Protein 8.0 6.5 - 8.1 g/dL   Albumin 5.0 3.5 - 5.0 g/dL   AST 24 15 - 41 U/L   ALT 19 0 - 44 U/L   Alkaline Phosphatase 71 38 - 126 U/L   Total Bilirubin 0.7 0.3 - 1.2 mg/dL   GFR, Estimated >60 >60 mL/min   Anion gap 10 5 - 15  CBC  Result Value Ref Range   WBC 7.7 4.0 - 10.5 K/uL   RBC 4.01 3.87 - 5.11 MIL/uL   Hemoglobin 12.6 12.0 - 15.0 g/dL   HCT 37.1 36.0 - 46.0 %   MCV 92.5 80.0 - 100.0 fL   MCH 31.4 26.0 - 34.0 pg   MCHC 34.0 30.0 - 36.0 g/dL   RDW 12.0 11.5 - 15.5 %   Platelets 354 150 - 400 K/uL   nRBC 0.0 0.0 - 0.2 %  Urinalysis, Complete w Microscopic Urine, Clean Catch  Result Value Ref Range   Color, Urine STRAW (A) YELLOW   APPearance CLEAR (A) CLEAR   Specific Gravity, Urine 1.009 1.005 - 1.030   pH 6.0 5.0 - 8.0   Glucose, UA NEGATIVE NEGATIVE mg/dL   Hgb urine dipstick MODERATE (A) NEGATIVE   Bilirubin Urine NEGATIVE NEGATIVE   Ketones, ur NEGATIVE NEGATIVE mg/dL   Protein, ur NEGATIVE NEGATIVE mg/dL   Nitrite NEGATIVE NEGATIVE   Leukocytes,Ua NEGATIVE NEGATIVE   RBC / HPF 0-5 0 - 5 RBC/hpf   WBC, UA 0-5 0 - 5 WBC/hpf   Bacteria, UA NONE SEEN NONE SEEN   Squamous Epithelial / LPF NONE SEEN 0 - 5      Assessment & Plan:   Problem List Items Addressed This Visit   None   Visit Diagnoses    Paresthesia and pain of both upper extremities    -  Primary   Relevant Medications   gabapentin (NEURONTIN) 100 MG capsule   busPIRone (BUSPAR) 7.5 MG tablet   GAD (generalized anxiety disorder)       Relevant Medications   busPIRone (BUSPAR) 7.5  MG tablet   Psychophysiological insomnia       Relevant Medications   busPIRone (BUSPAR) 7.5 MG tablet      Upper ext paresthesias, bilateral GAD w/ Insomnia  Multiple etiology current problem, suspect has some paresthesia component given bilateral component upper extremity, although it is randomly provoked  not predictable pattern with nerve impingement.  Regardless, symptoms of paresthesia / insomnia and some anxiety component may benefit from medication at this time.  Start trial on Gabapentin 100mg  titration up to 200-300mg  daily try to take regularly as symptoms occur daily, but can try PRN dosing if need in future.  Will restart Buspar 7.5mg  BID PRN for anxiety/insomnia, as this has helped in past. She can use this PRN only. She was on 15mg  BID dosing in past. We will reduce dose to start at this time.  She has history on Alprazolam, discussed that this is not a recommended long term anxiety option.  Future reconsider SSRI options.  Follow-up, if neuropathy or paresthesia etiology concerning for more physical nerve impingement - we can refer to Neuro for nerve conduction testing.  Meds ordered this encounter  Medications  . gabapentin (NEURONTIN) 100 MG capsule    Sig: Start 1 capsule daily, increase by 1 cap every 1 week if needed, up to max of 3 capsules at once daily    Dispense:  90 capsule    Refill:  1  . busPIRone (BUSPAR) 7.5 MG tablet    Sig: Take 1 tablet (7.5 mg total) by mouth 2 (two) times daily as needed (anxiety).    Dispense:  60 tablet    Refill:  2      Follow up plan: Return in about 3 months (around 07/11/2021) for 3 month follow-up w/ Rollene Fare as new PCP, for Nerve/paresthesia, anxiety, insomnia.   Nobie Putnam, [facility] 04/10/2021, 1:32 PM

## 2021-04-10 NOTE — Patient Instructions (Addendum)
Thank you for coming to the office today.  Start Gabapentin 100mg  capsules, take at night each 1 week you can increase dose by 1 capsule up to max of 3 capsules.  Can open capsules and takes with apple sauce  - In the future if needed, we can significantly increase the dose if tolerated well, some common doses are 300mg  three times a day up to 600mg  three times a day, usually it takes several weeks or months to get to higher doses  Buspar 7.5mg  twice a day as needed.  F/u with Dermatology as planned  Please schedule a Follow-up Appointment to: Return in about 3 months (around 07/11/2021) for 3 month follow-up w/ Rollene Fare as new PCP, for Nerve/paresthesia, anxiety, insomnia.  If you have any other questions or concerns, please feel free to call the office or send a message through [place]. You may also schedule an earlier appointment if necessary.  Additionally, you may be receiving a survey about your experience at our office within a few days to 1 week by e-mail or mail. We value your feedback.  Nobie Putnam, DO [place]

## 2021-04-24 ENCOUNTER — Other Ambulatory Visit: Payer: Self-pay | Admitting: Obstetrics & Gynecology

## 2021-04-24 DIAGNOSIS — G43809 Other migraine, not intractable, without status migrainosus: Secondary | ICD-10-CM

## 2021-04-28 ENCOUNTER — Encounter: Payer: Self-pay | Admitting: Internal Medicine

## 2021-05-16 ENCOUNTER — Encounter: Payer: Self-pay | Admitting: Internal Medicine

## 2021-05-17 ENCOUNTER — Other Ambulatory Visit: Payer: Self-pay

## 2021-05-17 ENCOUNTER — Encounter: Payer: Self-pay | Admitting: Internal Medicine

## 2021-05-17 ENCOUNTER — Ambulatory Visit (INDEPENDENT_AMBULATORY_CARE_PROVIDER_SITE_OTHER): Payer: 59 | Admitting: Internal Medicine

## 2021-05-17 VITALS — BP 107/61 | HR 67 | Temp 97.7°F | Resp 16 | Ht 69.0 in | Wt 121.2 lb

## 2021-05-17 DIAGNOSIS — G4700 Insomnia, unspecified: Secondary | ICD-10-CM | POA: Insufficient documentation

## 2021-05-17 DIAGNOSIS — R1013 Epigastric pain: Secondary | ICD-10-CM | POA: Diagnosis not present

## 2021-05-17 DIAGNOSIS — F5104 Psychophysiologic insomnia: Secondary | ICD-10-CM

## 2021-05-17 DIAGNOSIS — E782 Mixed hyperlipidemia: Secondary | ICD-10-CM | POA: Diagnosis not present

## 2021-05-17 DIAGNOSIS — F411 Generalized anxiety disorder: Secondary | ICD-10-CM

## 2021-05-17 DIAGNOSIS — R7303 Prediabetes: Secondary | ICD-10-CM | POA: Insufficient documentation

## 2021-05-17 DIAGNOSIS — G43C1 Periodic headache syndromes in child or adult, intractable: Secondary | ICD-10-CM

## 2021-05-17 DIAGNOSIS — Z8673 Personal history of transient ischemic attack (TIA), and cerebral infarction without residual deficits: Secondary | ICD-10-CM

## 2021-05-17 DIAGNOSIS — R634 Abnormal weight loss: Secondary | ICD-10-CM

## 2021-05-17 MED ORDER — TRAZODONE HCL 50 MG PO TABS: 25.0000 mg | ORAL_TABLET | Freq: Every evening | ORAL | 0 refills | Status: DC | PRN

## 2021-05-17 MED ORDER — ROSUVASTATIN CALCIUM 5 MG PO TABS: 5.0000 mg | ORAL_TABLET | Freq: Every day | ORAL | 3 refills | Status: DC

## 2021-05-17 MED ORDER — SUMATRIPTAN SUCCINATE 50 MG PO TABS: ORAL_TABLET | ORAL | 5 refills | Status: DC

## 2021-05-17 NOTE — Assessment & Plan Note (Signed)
C-Met and lipid profile today Encouraged her to consume a low-fat diet Rosuvastatin refilled

## 2021-05-17 NOTE — Patient Instructions (Signed)
Abdominal Pain, Adult Many things can cause belly (abdominal) pain. Most times, belly pain is not dangerous. Many cases of belly pain can be watched and treated at home. Sometimes, though, belly pain is serious. Yourdoctor will try to find the cause of your belly pain. Follow these instructions at home:  Medicines Take over-the-counter and prescription medicines only as told by your doctor. Do not take medicines that help you poop (laxatives) unless told by your doctor. General instructions Watch your belly pain for any changes. Drink enough fluid to keep your pee (urine) pale yellow. Keep all follow-up visits as told by your doctor. This is important. Contact a doctor if: Your belly pain changes or gets worse. You are not hungry, or you lose weight without trying. You are having trouble pooping (constipated) or have watery poop (diarrhea) for more than 2-3 days. You have pain when you pee or poop. Your belly pain wakes you up at night. Your pain gets worse with meals, after eating, or with certain foods. You are vomiting and cannot keep anything down. You have a fever. You have blood in your pee. Get help right away if: Your pain does not go away as soon as your doctor says it should. You cannot stop vomiting. Your pain is only in areas of your belly, such as the right side or the left lower part of the belly. You have bloody or black poop, or poop that looks like tar. You have very bad pain, cramping, or bloating in your belly. You have signs of not having enough fluid or water in your body (dehydration), such as: Dark pee, very little pee, or no pee. Cracked lips. Dry mouth. Sunken eyes. Sleepiness. Weakness. You have trouble breathing or chest pain. Summary Many cases of belly pain can be watched and treated at home. Watch your belly pain for any changes. Take over-the-counter and prescription medicines only as told by your doctor. Contact a doctor if your belly pain  changes or gets worse. Get help right away if you have very bad pain, cramping, or bloating in your belly. This information is not intended to replace advice given to you by your health care provider. Make sure you discuss any questions you have with your healthcare provider. Document Revised: 03/02/2019 Document Reviewed: 03/02/2019 Elsevier Patient Education  2022 Elsevier Inc.  

## 2021-05-17 NOTE — Assessment & Plan Note (Signed)
Imitrex refilled today

## 2021-05-17 NOTE — Assessment & Plan Note (Signed)
We will trial Trazodone, Rx sent to pharmacy

## 2021-05-17 NOTE — Progress Notes (Signed)
Subjective:    Patient ID: Erika Hurley, female    DOB: [birth year], 55 y.o.   MRN: [id number]  HPI  Patient presents to clinic today for follow-up of chronic conditions.  She is establishing care with me today, transferring care from Lonie Peak, NP.  Migraines: These occur once every 2 months.  Triggered by hormones, seasonal.  She takes Imitrex as needed with good relief of symptoms.  She does not follow with neurology.  HLD with History of Stroke: Her last LDL was 83, triglycerides 109, 09/2020.  She is not currently taking Rosuvastatin.  She tries to consume a low-fat diet. She follows with neurology at [facility].  She also reports upper abdominal pain. She describes the pain as a dull ache. She denies nausea, vomiting, constipation, diarrhea, reflux of blood in her stool. She has noticed that she has been losing weight, 9 lbs in the last 6 months, unintentional. She has taken Prilosec OTC but is not currently taking this.  There is no upper GI on file.  Colonoscopy from 12/2014 reviewed. She does have a family history of lung and breast cancer in mother. Her grandfather had prostate cancer.   Insomnia: She has difficulty falling and staying asleep.  She takes Melatonin with minimal relief of symptoms.  There is no sleep study on file.  Anxiety: Persistent, she stopped taking Buspar because it was not effective.  She is not currently seeing a therapist.  She denies depression, SI/HI.  Prediabetes: Her last A1c was 5.9%, 09/2015.  She is not taking any oral diabetic medication at this time.  She does not check her sugars.  Review of Systems     Past Medical History:  Diagnosis Date   Arthritis    Cervical dysplasia    History of abnormal cervical Pap smear    HGSIL   Migraines    Solitary cyst of breast    Stroke [facility])     Current Outpatient Medications  Medication Sig Dispense Refill   acyclovir (ZOVIRAX) 200 MG capsule acyclovir 200 mg capsule daily for suppression 90  capsule 3   busPIRone (BUSPAR) 7.5 MG tablet Take 1 tablet (7.5 mg total) by mouth 2 (two) times daily as needed (anxiety). 60 tablet 2   dicyclomine (BENTYL) 10 MG capsule Take 1 capsule (10 mg total) by mouth 4 (four) times daily -  before meals and at bedtime. 40 capsule 1   gabapentin (NEURONTIN) 100 MG capsule Start 1 capsule daily, increase by 1 cap every 1 week if needed, up to max of 3 capsules at once daily 90 capsule 1   Melatonin 3 MG TABS Take 10 mg by mouth.      Multiple Vitamins-Minerals (ZINC PO) Take by mouth daily.     rosuvastatin (CRESTOR) 5 MG tablet Take 1 tablet (5 mg total) by mouth daily. (Patient not taking: No sig reported) 90 tablet 2   sucralfate (CARAFATE) 1 GM/10ML suspension Take 10 mLs (1 g total) by mouth 4 (four) times daily -  with meals and at bedtime. 420 mL 0   SUMAtriptan (IMITREX) 50 MG tablet TAKE ONE TABLET BY MOUTH EVERY TWO HOURS AS NEEDED FOR MIGRAINE OR HEADACHE. MAY REPEAT IN TWO HOURS IF HEADACHE PERSISTS OR RECURS 10 tablet 0   No current facility-administered medications for this visit.    Allergies  Allergen Reactions   Sulfa Antibiotics Anaphylaxis    Family History  Problem Relation Age of Onset   Cancer Mother    Hypertension  Mother    Hyperlipidemia Mother    Kidney disease Mother    Breast cancer Mother 73       twice   Stroke Father    Heart disease Father    Hypertension Father    Hyperlipidemia Father    Kidney disease Father    Cancer Maternal Grandfather     Social History   Socioeconomic History   Marital status: Soil scientist    Spouse name: Not on file   Number of children: Not on file   Years of education: Not on file   Highest education level: Not on file  Occupational History   Not on file  Tobacco Use   Smoking status: Never   Smokeless tobacco: Never  Vaping Use   Vaping Use: Never used  Substance and Sexual Activity   Alcohol use: Yes    Alcohol/week: 2.0 standard drinks    Types: 2 Glasses  of wine per week   Drug use: No   Sexual activity: Yes    Partners: Male    Birth control/protection: Post-menopausal  Other Topics Concern   Not on file  Social History Narrative   Divorced    Employed Engineer, manufacturing systems    Social Determinants of Health   Financial Resource Strain: Not on file  Food Insecurity: Not on file  Transportation Needs: Not on file  Physical Activity: Not on file  Stress: Not on file  Social Connections: Not on file  Intimate Partner Violence: Not on file     Constitutional: Patient reports headaches.  Denies fever, malaise, fatigue, or abrupt weight changes.  HEENT: Denies eye pain, eye redness, ear pain, ringing in the ears, wax buildup, runny nose, nasal congestion, bloody nose, or sore throat. Respiratory: Denies difficulty breathing, shortness of breath, cough or sputum production.   Cardiovascular: Denies chest pain, chest tightness, palpitations or swelling in the hands or feet.  Gastrointestinal: Patient reports epigastric pain.  Denies bloating, constipation, diarrhea or blood in the stool.  GU: Denies urgency, frequency, pain with urination, burning sensation, blood in urine, odor or discharge. Musculoskeletal: Denies decrease in range of motion, difficulty with gait, muscle pain or joint pain and swelling.  Skin: Denies redness, rashes, lesions or ulcercations.  Neurological: Patient reports insomnia.  Denies dizziness, difficulty with memory, difficulty with speech or problems with balance and coordination.  Psych: Patient reports anxiety.  Denies depression, SI/HI.  No other specific complaints in a complete review of systems (except as listed in HPI above).  Objective:   Physical Exam BP 107/61 (BP Location: Left Arm, Patient Position: Sitting, Cuff Size: Small)   Pulse 67   Temp 97.7 F (36.5 C) (Temporal)   Resp 16   Ht '5\' 9"'  (1.753 m)   Wt 121 lb 3.2 oz (55 kg)   SpO2 100%   BMI 17.90 kg/m   Wt Readings from Last 3  Encounters:  04/10/21 125 lb (56.7 kg)  02/20/21 127 lb (57.6 kg)  02/03/21 125 lb (56.7 kg)    General: Appears her stated age, underweight, in NAD. Skin: Warm, dry and intact.  HEENT: Head: normal shape and size; Eyes: sclera white and EOMs intact;  Neck:  Neck supple, trachea midline. No masses, lumps or thyromegaly present.  Cardiovascular: Normal rate and rhythm. S1,S2 noted.  No murmur, rubs or gallops noted.  Pulmonary/Chest: Normal effort and positive vesicular breath sounds. No respiratory distress. No wheezes, rales or ronchi noted.  Abdomen: Soft and tender in the epigastric region.  Normal bowel sounds. No distention or masses noted. Liver, spleen and kidneys non palpable. Musculoskeletal: No difficulty with gait.  Neurological: Alert and oriented.   Psychiatric: Mood and affect normal.  Mildly anxious. Judgment and thought content normal.    BMET    Component Value Date/Time   NA 141 02/03/2021 1836   K 4.6 02/03/2021 1836   CL 105 02/03/2021 1836   CO2 26 02/03/2021 1836   GLUCOSE 139 (H) 02/03/2021 1836   BUN 17 02/03/2021 1836   CREATININE 0.81 02/03/2021 1836   CREATININE 0.90 09/12/2020 1005   CALCIUM 9.8 02/03/2021 1836   GFRNONAA >60 02/03/2021 1836   GFRNONAA 71 09/12/2020 1005   GFRAA 83 09/12/2020 1005    Lipid Panel     Component Value Date/Time   CHOL 157 09/12/2020 1005   TRIG 109 09/12/2020 1005   HDL 53 09/12/2020 1005   CHOLHDL 3.0 09/12/2020 1005   VLDL 16.6 02/23/2016 0905   LDLCALC 83 09/12/2020 1005    CBC    Component Value Date/Time   WBC 7.7 02/03/2021 1836   RBC 4.01 02/03/2021 1836   HGB 12.6 02/03/2021 1836   HCT 37.1 02/03/2021 1836   PLT 354 02/03/2021 1836   MCV 92.5 02/03/2021 1836   MCH 31.4 02/03/2021 1836   MCHC 34.0 02/03/2021 1836   RDW 12.0 02/03/2021 1836   LYMPHSABS 1,926 09/12/2020 1005   MONOABS 0.4 09/15/2015 1028   EOSABS 29 09/12/2020 1005   BASOSABS 29 09/12/2020 1005    Hgb A1C Lab Results   Component Value Date   HGBA1C 5.9 09/15/2015           Assessment & Plan:  Epigastric Pain, Unintentional Weight Loss:  We will check CBC, c-Met, TSH, A1c and H. pylori today Referral to GI for upper endoscopy She will start Prilosec OTC daily  We will follow-up after labs, return precautions discussed  Webb Silversmith, NP This visit occurred during the SARS-CoV-2 public health emergency.  Safety protocols were in place, including screening questions prior to the visit, additional usage of staff PPE, and extensive cleaning of exam room while observing appropriate contact time as indicated for disinfecting solutions.

## 2021-05-17 NOTE — Assessment & Plan Note (Signed)
A1c today.  

## 2021-05-17 NOTE — Assessment & Plan Note (Signed)
Mainly cognitive effects, following neurology Encouraged to consume a low-fat diet Rosuvastatin refilled today

## 2021-05-17 NOTE — Assessment & Plan Note (Signed)
We will see if this improves once we get her sleeping better Support offered

## 2021-05-18 ENCOUNTER — Encounter: Payer: Self-pay | Admitting: *Deleted

## 2021-05-18 LAB — TSH: TSH: 2.04 mIU/L (ref 0.40–4.50)

## 2021-05-18 LAB — COMPLETE METABOLIC PANEL WITH GFR
AG Ratio: 1.9 (calc) (ref 1.0–2.5)
ALT: 11 U/L (ref 6–29)
AST: 16 U/L (ref 10–35)
Albumin: 4.6 g/dL (ref 3.6–5.1)
Alkaline phosphatase (APISO): 68 U/L (ref 37–153)
BUN: 16 mg/dL (ref 7–25)
CO2: 29 mmol/L (ref 20–32)
Calcium: 9.6 mg/dL (ref 8.6–10.4)
Chloride: 106 mmol/L (ref 98–110)
Creat: 0.96 mg/dL (ref 0.50–1.03)
Globulin: 2.4 g/dL (calc) (ref 1.9–3.7)
Glucose, Bld: 91 mg/dL (ref 65–139)
Potassium: 4.8 mmol/L (ref 3.5–5.3)
Sodium: 143 mmol/L (ref 135–146)
Total Bilirubin: 0.7 mg/dL (ref 0.2–1.2)
Total Protein: 7 g/dL (ref 6.1–8.1)
eGFR: 69 mL/min/{1.73_m2} (ref >=60)

## 2021-05-18 LAB — CBC
HCT: 41.1 % (ref 35.0–45.0)
Hemoglobin: 13.9 g/dL (ref 11.7–15.5)
MCH: 32 pg (ref 27.0–33.0)
MCHC: 33.8 g/dL (ref 32.0–36.0)
MCV: 94.7 fL (ref 80.0–100.0)
MPV: 10.2 fL (ref 7.5–12.5)
Platelets: 264 10*3/uL (ref 140–400)
RBC: 4.34 10*6/uL (ref 3.80–5.10)
RDW: 11.8 % (ref 11.0–15.0)
WBC: 6.3 10*3/uL (ref 3.8–10.8)

## 2021-05-18 LAB — LIPID PANEL
Cholesterol: 229 mg/dL — ABNORMAL HIGH (ref <200)
HDL: 55 mg/dL (ref >=50)
LDL Cholesterol (Calc): 148 mg/dL (calc) — ABNORMAL HIGH
Non-HDL Cholesterol (Calc): 174 mg/dL (calc) — ABNORMAL HIGH (ref <130)
Total CHOL/HDL Ratio: 4.2 (calc) (ref <5.0)
Triglycerides: 133 mg/dL (ref <150)

## 2021-05-18 LAB — HEMOGLOBIN A1C
Hgb A1c MFr Bld: 5.7 % of total Hgb — ABNORMAL HIGH (ref <5.7)
Mean Plasma Glucose: 117 mg/dL
eAG (mmol/L): 6.5 mmol/L

## 2021-05-18 LAB — H. PYLORI BREATH TEST: H. pylori Breath Test: NOT DETECTED

## 2021-06-08 ENCOUNTER — Encounter: Payer: Self-pay | Admitting: Gastroenterology

## 2021-06-08 ENCOUNTER — Other Ambulatory Visit: Payer: Self-pay

## 2021-06-08 ENCOUNTER — Ambulatory Visit (INDEPENDENT_AMBULATORY_CARE_PROVIDER_SITE_OTHER): Payer: 59 | Admitting: Gastroenterology

## 2021-06-08 VITALS — BP 127/75 | HR 76 | Temp 98.3°F | Ht 69.0 in | Wt 122.4 lb

## 2021-06-08 DIAGNOSIS — R1084 Generalized abdominal pain: Secondary | ICD-10-CM

## 2021-06-08 NOTE — Progress Notes (Signed)
Erika Bellows MD, MRCP(U.K) [address]  [place]  [place], [place] [address]  Main: [phone]  Fax: [phone]   Gastroenterology Consultation  Referring Provider:     Jearld Fenton, NP Primary Care Physician:  Jearld Fenton, NP Primary Gastroenterologist:  Dr. Jonathon Hurley  Reason for Consultation:     Abdominal pain        HPI:   Erika Hurley is a 55 y.o. y/o female referred for consultation & management  by Jearld Fenton, NP.    She is here to see me for abdominal pain.  Last colonoscopy in 2016 by Dr. Candace Cruise for colon cancer screening scope withdrawal time was 3 minutes no polyps were seen  05/17/2021 : H. pylori breath test negative, HbA1c 5.7, hemoglobin 13.9 g, CMP normal, TSH normal. She has been losing weight intentionally but also possibly had traction intentionally in April 2022 she had a CT renal stone protocol which showed no gross abnormality.  She has had an ultrasound of the gallbladder in April 2022 which showed no abnormality.  Continues to have epigastric pain not relieved after a bowel movement.  She does have pain that is relieved sometimes after eating food.  The Nexium has not made much of a difference.    Past Medical History:  Diagnosis Date   Arthritis    Cervical dysplasia    History of abnormal cervical Pap smear    HGSIL   Migraines    Solitary cyst of breast    Stroke [facility])     Past Surgical History:  Procedure Laterality Date   BREAST CYST ASPIRATION Right 2005?       LEEP  2004   spinal tap  2012    Prior to Admission medications   Medication Sig Start Date End Date Taking? Authorizing Provider  Melatonin 3 MG TABS Take 30 mg by mouth.   Yes [provider]  meloxicam (MOBIC) 15 MG tablet meloxicam 15 mg tablet  Take 1 tablet every day by oral route.   Yes [provider]  rosuvastatin (CRESTOR) 5 MG tablet Take 1 tablet (5 mg total) by mouth daily. 05/17/21  Yes Baity, Coralie Keens, NP  SUMAtriptan  (IMITREX) 50 MG tablet TAKE ONE TABLET BY MOUTH EVERY TWO HOURS AS NEEDED FOR MIGRAINE OR HEADACHE. MAY REPEAT IN TWO HOURS IF HEADACHE PERSISTS OR RECURS 05/17/21  Yes Jearld Fenton, NP  traZODone (DESYREL) 50 MG tablet Take 0.5-1 tablets (25-50 mg total) by mouth at bedtime as needed for sleep. 05/17/21  Yes Jearld Fenton, NP  zinc gluconate 50 MG tablet Take 1 tablet by mouth daily.   Yes [provider]    Family History  Problem Relation Age of Onset   Cancer Mother    Hypertension Mother    Hyperlipidemia Mother    Kidney disease Mother    Breast cancer Mother 91       twice   Stroke Father    Heart disease Father    Hypertension Father    Hyperlipidemia Father    Kidney disease Father    Cancer Maternal Grandfather      Social History   Tobacco Use   Smoking status: Never   Smokeless tobacco: Never  Vaping Use   Vaping Use: Never used  Substance Use Topics   Alcohol use: Yes    Alcohol/week: 2.0 standard drinks    Types: 2 Glasses of wine per week   Drug use: No  Allergies as of 06/08/2021 - Review Complete 06/08/2021  Allergen Reaction Noted   Sulfa antibiotics Anaphylaxis 03/02/2013    Review of Systems:    All systems reviewed and negative except where noted in HPI.   Physical Exam:  BP 127/75   Pulse 76   Temp 98.3 F (36.8 C) (Oral)   Ht '5\' 9"'$  (1.753 m)   Wt 122 lb 6.4 oz (55.5 kg)   BMI 18.08 kg/m  No LMP recorded. Patient is postmenopausal. Psych:  Alert and cooperative. Normal mood and affect. General:   Alert,  Well-developed, well-nourished, pleasant and cooperative in NAD Head:  Normocephalic and atraumatic. Eyes:  Sclera clear, no icterus.   Conjunctiva pink. Ears:  Normal auditory acuity. Lungs:  Respirations even and unlabored.  Clear throughout to auscultation.   No wheezes, crackles, or rhonchi. No acute distress. Heart:  Regular rate and rhythm; no murmurs, clicks, rubs, or gallops. Abdomen:  Normal bowel sounds.  No  bruits.  Soft, non-tender and non-distended without masses, hepatosplenomegaly or hernias noted.  No guarding or rebound tenderness.   . Neurologic:  Alert and oriented x3;  grossly normal neurologically. Psych:  Alert and cooperative. Normal mood and affect.  Imaging Studies: No results found.  Assessment and Plan:   Erika Hurley is a 55 y.o. y/o female has been referred for epigastric pain .  Not improved with PPI.  Plan  EGD to evaluate abdominal pain HIDA to evaluate for gallbladder dyskinesia   I have discussed alternative options, risks & benefits,  which include, but are not limited to, bleeding, infection, perforation,respiratory complication & drug reaction.  The patient agrees with this plan & written consent will be obtained.     Follow up in 4-6 weeks   Dr Erika Bellows MD,MRCP(U.K)

## 2021-06-26 ENCOUNTER — Other Ambulatory Visit: Payer: Self-pay

## 2021-06-26 ENCOUNTER — Ambulatory Visit
Admission: RE | Admit: 2021-06-26 | Discharge: 2021-06-26 | Disposition: A | Discharge status: Home / Self Care | Payer: 59 | Source: Ambulatory Visit | Attending: Gastroenterology | Admitting: Gastroenterology

## 2021-06-26 DIAGNOSIS — R1084 Generalized abdominal pain: Secondary | ICD-10-CM | POA: Diagnosis present

## 2021-06-26 MED ORDER — TECHNETIUM TC 99M MEBROFENIN IV KIT
5.0000 | PACK | Freq: Once | INTRAVENOUS | Status: AC | PRN
  Administered 2021-06-26: 5.31 via INTRAVENOUS

## 2021-06-30 ENCOUNTER — Ambulatory Visit
Admission: RE | Admit: 2021-06-30 | Discharge: 2021-06-30 | Disposition: A | Discharge status: Home / Self Care | Payer: 59 | Attending: Gastroenterology | Admitting: Gastroenterology

## 2021-06-30 ENCOUNTER — Ambulatory Visit: Payer: 59 | Admitting: Registered Nurse

## 2021-06-30 ENCOUNTER — Encounter
Admission: RE | Disposition: A | Discharge status: Home / Self Care | Payer: Self-pay | Source: Home / Self Care | Attending: Gastroenterology

## 2021-06-30 DIAGNOSIS — Z882 Allergy status to sulfonamides status: Secondary | ICD-10-CM | POA: Diagnosis not present

## 2021-06-30 DIAGNOSIS — Z823 Family history of stroke: Secondary | ICD-10-CM | POA: Insufficient documentation

## 2021-06-30 DIAGNOSIS — Z791 Long term (current) use of non-steroidal anti-inflammatories (NSAID): Secondary | ICD-10-CM | POA: Insufficient documentation

## 2021-06-30 DIAGNOSIS — R1084 Generalized abdominal pain: Secondary | ICD-10-CM

## 2021-06-30 DIAGNOSIS — Z79899 Other long term (current) drug therapy: Secondary | ICD-10-CM | POA: Insufficient documentation

## 2021-06-30 DIAGNOSIS — R109 Unspecified abdominal pain: Secondary | ICD-10-CM | POA: Diagnosis present

## 2021-06-30 DIAGNOSIS — Z8673 Personal history of transient ischemic attack (TIA), and cerebral infarction without residual deficits: Secondary | ICD-10-CM | POA: Diagnosis not present

## 2021-06-30 DIAGNOSIS — K3189 Other diseases of stomach and duodenum: Secondary | ICD-10-CM | POA: Diagnosis not present

## 2021-06-30 HISTORY — PX: ESOPHAGOGASTRODUODENOSCOPY (EGD) WITH PROPOFOL: SHX5813

## 2021-06-30 SURGERY — ESOPHAGOGASTRODUODENOSCOPY (EGD) WITH PROPOFOL
Anesthesia: General

## 2021-06-30 MED ORDER — SODIUM CHLORIDE 0.9 % IV SOLN: INTRAVENOUS | Status: DC

## 2021-06-30 MED ORDER — GLYCOPYRROLATE 0.2 MG/ML IJ SOLN
INTRAMUSCULAR | Status: DC | PRN
  Administered 2021-06-30: .2 mg via INTRAVENOUS

## 2021-06-30 MED ORDER — PROPOFOL 500 MG/50ML IV EMUL
INTRAVENOUS | Status: DC | PRN
  Administered 2021-06-30: 175 ug/kg/min via INTRAVENOUS

## 2021-06-30 MED ORDER — DEXMEDETOMIDINE (PRECEDEX) IN NS 20 MCG/5ML (4 MCG/ML) IV SYRINGE
PREFILLED_SYRINGE | INTRAVENOUS | Status: DC | PRN
  Administered 2021-06-30: 12 ug via INTRAVENOUS

## 2021-06-30 MED ORDER — PROPOFOL 10 MG/ML IV BOLUS
INTRAVENOUS | Status: DC | PRN
  Administered 2021-06-30: 80 mg via INTRAVENOUS

## 2021-06-30 MED ORDER — PROPOFOL 500 MG/50ML IV EMUL
INTRAVENOUS | Status: AC
  Filled 2021-06-30: qty 50

## 2021-06-30 NOTE — Transfer of Care (Signed)
Immediate Anesthesia Transfer of Care Note  Patient: Erika Hurley  Procedure(s) Performed: ESOPHAGOGASTRODUODENOSCOPY (EGD) WITH PROPOFOL  Patient Location: Endoscopy Unit  Anesthesia Type:General  Level of Consciousness: drowsy  Airway & Oxygen Therapy: Patient Spontanous Breathing and Patient connected to nasal cannula oxygen  Post-op Assessment: Report given to RN and Post -op Vital signs reviewed and stable  Post vital signs: Reviewed and stable  Last Vitals:  Vitals Value Taken Time  BP    Temp    Pulse 66 06/30/21 0926  Resp 14 06/30/21 0926  SpO2 100 % 06/30/21 0926  Vitals shown include unvalidated device data.  Last Pain:  Vitals:   06/30/21 0831  TempSrc: Temporal         Complications: No notable events documented.

## 2021-06-30 NOTE — Anesthesia Preprocedure Evaluation (Signed)
Anesthesia Evaluation  Patient identified by MRN, date of birth, ID band Patient awake    Reviewed: Allergy & Precautions, NPO status , Patient's Chart, lab work & pertinent test results  Airway Mallampati: II  TM Distance: >3 FB Neck ROM: Full    Dental no notable dental hx.    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)      Neuro/Psych  Headaches, PSYCHIATRIC DISORDERS Anxiety CVA (Memory Deficits), Residual Symptoms    GI/Hepatic negative GI ROS, Neg liver ROS,   Endo/Other  negative endocrine ROS  Renal/GU negative Renal ROS  negative genitourinary   Musculoskeletal  (+) Arthritis ,   Abdominal   Peds negative pediatric ROS (+)  Hematology negative hematology ROS (+)   Anesthesia Other Findings Arthritis    Cervical dysplasia    History of abnormal cervical Pap smear  HGSIL  Migraines    Solitary cyst of breast    Stroke (HCC)       Reproductive/Obstetrics negative OB ROS                             Anesthesia Physical Anesthesia Plan  ASA: 3  Anesthesia Plan: General   Post-op Pain Management:    Induction: Intravenous  PONV Risk Score and Plan: 2 and Propofol infusion and TIVA  Airway Management Planned: Natural Airway and Nasal Cannula  Additional Equipment:   Intra-op Plan:   Post-operative Plan:   Informed Consent: I have reviewed the patients History and Physical, chart, labs and discussed the procedure including the risks, benefits and alternatives for the proposed anesthesia with the patient or authorized representative who has indicated his/her understanding and acceptance.       Plan Discussed with: CRNA, Anesthesiologist and Surgeon  Anesthesia Plan Comments:         Anesthesia Quick Evaluation

## 2021-06-30 NOTE — Anesthesia Postprocedure Evaluation (Signed)
Anesthesia Post Note  Patient: Erika Hurley  Procedure(s) Performed: ESOPHAGOGASTRODUODENOSCOPY (EGD) WITH PROPOFOL  Patient location during evaluation: Phase II Anesthesia Type: General Level of consciousness: awake and alert, awake and oriented Pain management: pain level controlled Vital Signs Assessment: post-procedure vital signs reviewed and stable Respiratory status: spontaneous breathing, nonlabored ventilation and respiratory function stable Cardiovascular status: blood pressure returned to baseline and stable Postop Assessment: no apparent nausea or vomiting Anesthetic complications: no   No notable events documented.   Last Vitals:  Vitals:   06/30/21 0944 06/30/21 0947  BP: 94/68 105/75  Pulse: 67 67  Resp: (!) 24 14  Temp:    SpO2: 98% 100%    Last Pain:  Vitals:   06/30/21 0931  TempSrc: Temporal                 Phill Mutter

## 2021-06-30 NOTE — Op Note (Signed)
[facility] Gastroenterology Patient Name: Erika Hurley Procedure Date: 06/30/2021 9:12 AM MRN: [id number] Account #: [id number] Date of Birth: 1966-04-09 Admit Type: Outpatient Age: 55 Room: [facility] ENDO ROOM 4 Gender: Female Note Status: Finalized Procedure:             Upper GI endoscopy Indications:           Abdominal pain Providers:             Jonathon Bellows MD, MD Referring MD:          Jearld Fenton (Referring MD) Medicines:             Monitored Anesthesia Care Complications:         No immediate complications. Procedure:             Pre-Anesthesia Assessment:                        - Prior to the procedure, a History and Physical was                         performed, and patient medications, allergies and                         sensitivities were reviewed. The patient's tolerance                         of previous anesthesia was reviewed.                        - The risks and benefits of the procedure and the                         sedation options and risks were discussed with the                         patient. All questions were answered and informed                         consent was obtained.                        - ASA Grade Assessment: II - A patient with mild                         systemic disease.                        After obtaining informed consent, the endoscope was                         passed under direct vision. Throughout the procedure,                         the patient's blood pressure, pulse, and oxygen                         saturations were monitored continuously. The Endoscope                         was introduced through the mouth, and advanced  to the                         third part of duodenum. The upper GI endoscopy was                         accomplished with ease. The patient tolerated the                         procedure well. Findings:      The examined duodenum was normal.      The esophagus was  normal.      The entire examined stomach was normal. Biopsies were taken with a cold       forceps for histology.      The cardia and gastric fundus were normal on retroflexion. Impression:            - Normal examined duodenum.                        - Normal esophagus.                        - Normal stomach. Biopsied. Recommendation:        - Await pathology results.                        - Discharge patient to home (with escort).                        - Resume previous diet.                        - Continue present medications.                        - Return to my office as previously scheduled. Procedure Code(s):     --- Professional ---                        [phone], Esophagogastroduodenoscopy, flexible,                         transoral; with biopsy, single or multiple Diagnosis Code(s):     --- Professional ---                        R10.9, Unspecified abdominal pain CPT copyright 2019 American Medical Association. All rights reserved. The codes documented in this report are preliminary and upon coder review may  be revised to meet current compliance requirements. Jonathon Bellows, MD Jonathon Bellows MD, MD 06/30/2021 9:23:43 AM This report has been signed electronically. Number of Addenda: 0 Note Initiated On: 06/30/2021 9:12 AM Estimated Blood Loss:  Estimated blood loss: none.      [facility]

## 2021-06-30 NOTE — H&P (Signed)
Jonathon Bellows, MD [address] [place], [place], [place], [address] [address], [place], [place], [place], [address] Phone: [phone]  Fax: [phone]  Primary Care Physician:  Jearld Fenton, NP   Pre-Procedure History & Physical: HPI:  Erika Hurley is a 55 y.o. female is here for an endoscopy    Past Medical History:  Diagnosis Date   Arthritis    Cervical dysplasia    History of abnormal cervical Pap smear    HGSIL   Migraines    Solitary cyst of breast    Stroke [facility])     Past Surgical History:  Procedure Laterality Date   BREAST CYST ASPIRATION Right 2005?       LEEP  2004   spinal tap  2012    Prior to Admission medications   Medication Sig Start Date End Date Taking? Authorizing Provider  esomeprazole (NEXIUM) 40 MG capsule Take 40 mg by mouth daily at 12 noon.   Yes [provider]  Melatonin 3 MG TABS Take 30 mg by mouth.   Yes [provider]  meloxicam (MOBIC) 15 MG tablet meloxicam 15 mg tablet  Take 1 tablet every day by oral route.   Yes [provider]  rosuvastatin (CRESTOR) 5 MG tablet Take 1 tablet (5 mg total) by mouth daily. 05/17/21  Yes Baity, Coralie Keens, NP  SUMAtriptan (IMITREX) 50 MG tablet TAKE ONE TABLET BY MOUTH EVERY TWO HOURS AS NEEDED FOR MIGRAINE OR HEADACHE. MAY REPEAT IN TWO HOURS IF HEADACHE PERSISTS OR RECURS 05/17/21   Jearld Fenton, NP    Allergies as of 06/08/2021 - Review Complete 06/08/2021  Allergen Reaction Noted   Sulfa antibiotics Anaphylaxis 03/02/2013    Family History  Problem Relation Age of Onset   Cancer Mother    Hypertension Mother    Hyperlipidemia Mother    Kidney disease Mother    Breast cancer Mother 2       twice   Stroke Father    Heart disease Father    Hypertension Father    Hyperlipidemia Father    Kidney disease Father    Cancer Maternal Grandfather     Social History   Socioeconomic History   Marital status: Soil scientist    Spouse name:  Not on file   Number of children: Not on file   Years of education: Not on file   Highest education level: Not on file  Occupational History   Not on file  Tobacco Use   Smoking status: Never   Smokeless tobacco: Never  Vaping Use   Vaping Use: Never used  Substance and Sexual Activity   Alcohol use: Yes    Alcohol/week: 2.0 standard drinks    Types: 2 Glasses of wine per week   Drug use: No   Sexual activity: Yes    Partners: Male    Birth control/protection: Post-menopausal  Other Topics Concern   Not on file  Social History Narrative   Divorced    Employed Engineer, manufacturing systems    Social Determinants of Health   Financial Resource Strain: Not on file  Food Insecurity: Not on file  Transportation Needs: Not on file  Physical Activity: Not on file  Stress: Not on file  Social Connections: Not on file  Intimate Partner Violence: Not on file    Review of Systems: See HPI, otherwise negative ROS  Physical Exam: BP (!) 146/75   Pulse 65   Temp (!) 96.9  F (36.1 C) (Temporal)   Resp 16   Ht '5\' 9"'$  (1.753 m)   Wt 54.5 kg   SpO2 100%   BMI 17.76 kg/m  General:   Alert,  pleasant and cooperative in NAD Head:  Normocephalic and atraumatic. Neck:  Supple; no masses or thyromegaly. Lungs:  Clear throughout to auscultation, normal respiratory effort.    Heart:  +S1, +S2, Regular rate and rhythm, No edema. Abdomen:  Soft, nontender and nondistended. Normal bowel sounds, without guarding, and without rebound.   Neurologic:  Alert and  oriented x4;  grossly normal neurologically.  Impression/Plan: Erika Hurley is here for an endoscopy  to be performed for  evaluation of abdominal pain    Risks, benefits, limitations, and alternatives regarding endoscopy have been reviewed with the patient.  Questions have been answered.  All parties agreeable.   Jonathon Bellows, MD  06/30/2021, 9:06 AM

## 2021-07-03 ENCOUNTER — Encounter: Payer: Self-pay | Admitting: Gastroenterology

## 2021-07-03 LAB — SURGICAL PATHOLOGY

## 2021-07-04 ENCOUNTER — Encounter: Payer: Self-pay | Admitting: Gastroenterology

## 2021-07-04 NOTE — Progress Notes (Signed)
normal

## 2021-07-06 ENCOUNTER — Other Ambulatory Visit: Payer: Self-pay

## 2021-07-06 DIAGNOSIS — E782 Mixed hyperlipidemia: Secondary | ICD-10-CM

## 2021-07-25 ENCOUNTER — Other Ambulatory Visit: Payer: Self-pay

## 2021-07-25 ENCOUNTER — Ambulatory Visit (INDEPENDENT_AMBULATORY_CARE_PROVIDER_SITE_OTHER): Payer: 59 | Admitting: Gastroenterology

## 2021-07-25 ENCOUNTER — Encounter: Payer: Self-pay | Admitting: Gastroenterology

## 2021-07-25 VITALS — BP 98/61 | HR 64 | Temp 98.3°F | Ht 69.0 in | Wt 122.0 lb

## 2021-07-25 DIAGNOSIS — R1084 Generalized abdominal pain: Secondary | ICD-10-CM

## 2021-07-25 DIAGNOSIS — Z8371 Family history of colonic polyps: Secondary | ICD-10-CM | POA: Diagnosis not present

## 2021-07-25 MED ORDER — NA SULFATE-K SULFATE-MG SULF 17.5-3.13-1.6 GM/177ML PO SOLN: 354.0000 mL | Freq: Once | ORAL | 0 refills | Status: AC

## 2021-07-25 MED ORDER — SUTAB 1479-225-188 MG PO TABS: 12.0000 | ORAL_TABLET | Freq: Once | ORAL | 0 refills | Status: AC

## 2021-07-25 NOTE — Progress Notes (Signed)
Jonathon Bellows MD, MRCP(U.K) [address]  [place]  [place], [place] [address]  Main: [phone]  Fax: [phone]   Primary Care Physician: Jearld Fenton, NP  Primary Gastroenterologist:  Dr. Jonathon Bellows   C/c : Abdominal pain follow up    HPI: Erika Hurley is a 55 y.o. female   Summary of history :  Initially referred and seen on 06/08/2021 for abdominal pain . Colonoscopy in 2016 by Dr. Candace Cruise for colon cancer screening scope withdrawal time was 3 minutes no polyps were seen   05/17/2021 : H. pylori breath test negative, HbA1c 5.7, hemoglobin 13.9 g, CMP normal, TSH normal. She had been losing weight intentionally but also possibly had traction intentionally in April 2022 she had a CT renal stone protocol which showed no gross abnormality.  She has had an ultrasound of the gallbladder in April 2022 which showed no abnormality.   Continued to have epigastric pain not relieved after a bowel movement.  She does have pain that is relieved sometimes after eating food.  The Nexium has not made much of a difference.    Interval history   06/08/2021-07/25/2021  Still has the abdominal discomfort that she had previously.  Denies any constipation.  Pain not improved with the bowel movement.  Denies passage of hard stools.  Not on any NSAIDs.  06/30/2021: EGD: Normal appearance.Biopsies of the stomach were normal  06/26/2021: HIDA scan : Normal   She states that she found out her mother had colon polyps and she is due for a colonoscopy.  Current Outpatient Medications  Medication Sig Dispense Refill   esomeprazole (NEXIUM) 40 MG capsule Take 40 mg by mouth daily at 12 noon.     Melatonin 3 MG TABS Take 30 mg by mouth.     meloxicam (MOBIC) 15 MG tablet meloxicam 15 mg tablet  Take 1 tablet every day by oral route.     rosuvastatin (CRESTOR) 5 MG tablet Take 1 tablet (5 mg total) by mouth daily. 90 tablet 3   Sodium Sulfate-Mag Sulfate-KCl (SUTAB) [phone] MG TABS Take 12  tablets by mouth once for 1 dose. 24 tablet 0   SUMAtriptan (IMITREX) 50 MG tablet TAKE ONE TABLET BY MOUTH EVERY TWO HOURS AS NEEDED FOR MIGRAINE OR HEADACHE. MAY REPEAT IN TWO HOURS IF HEADACHE PERSISTS OR RECURS 10 tablet 5   No current facility-administered medications for this visit.    Allergies as of 07/25/2021 - Review Complete 07/25/2021  Allergen Reaction Noted   Sulfa antibiotics Anaphylaxis 03/02/2013    ROS:  General: Negative for anorexia, weight loss, fever, chills, fatigue, weakness. ENT: Negative for hoarseness, difficulty swallowing , nasal congestion. CV: Negative for chest pain, angina, palpitations, dyspnea on exertion, peripheral edema.  Respiratory: Negative for dyspnea at rest, dyspnea on exertion, cough, sputum, wheezing.  GI: See history of present illness. GU:  Negative for dysuria, hematuria, urinary incontinence, urinary frequency, nocturnal urination.  Endo: Negative for unusual weight change.    Physical Examination:   BP 98/61 (BP Location: Left Arm, Patient Position: Sitting, Cuff Size: Normal)   Pulse 64   Temp 98.3 F (36.8 C) (Oral)   Ht 5\' 9"  (1.753 m)   Wt 122 lb (55.3 kg)   BMI 18.02 kg/m   General: Well-nourished, well-developed in no acute distress.  Eyes: No icterus. Conjunctivae pink. Mouth: Oropharyngeal mucosa moist and pink , no lesions erythema or exudate. Neuro: Alert and oriented x 3.  Grossly intact. Skin: Warm and  dry, no jaundice.   Psych: Alert and cooperative, normal mood and affect.   Imaging Studies: NM Hepato W/EjeCT Fract  Result Date: 06/27/2021 CLINICAL DATA:  Acute nonlocalized abdominal pain, unintentional weight loss, hair loss, postprandial abdominal pain EXAM: NUCLEAR MEDICINE HEPATOBILIARY IMAGING WITH GALLBLADDER EF TECHNIQUE: Sequential images of the abdomen were obtained out to 60 minutes following intravenous administration of radiopharmaceutical. After oral ingestion of Ensure, gallbladder ejection  fraction was determined. At 60 min, normal ejection fraction is greater than 33%. RADIOPHARMACEUTICALS:  5.3 mCi Tc-90m  Choletec IV COMPARISON:  None. FINDINGS: Prompt uptake and biliary excretion of activity by the liver is seen. Gallbladder activity is visualized, consistent with patency of cystic duct. Biliary activity passes into small bowel, consistent with patent common bile duct. Calculated gallbladder ejection fraction is 97%. (Normal gallbladder ejection fraction with Ensure is greater than 33%.) IMPRESSION: Normal exam Electronically Signed   By: Fidela Salisbury M.D.   On: 06/27/2021 02:04    Assessment and Plan:   Hoa ARAH ARO is a 55 y.o. y/o female here to follow up for epigastric pain .  Not improved with PPI.  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  Talked about commencing on amitriptyline but she would would like to try that after her colonoscopy to see if anything else found in the colon.  She would like to try IBgard in the meanwhile.  She found out that her mother had colon polyps and hence she would be due for a colonoscopy as it has been more than 5 years since her last one and withdrawal time was 3 minutes during her last procedure which does increase the risk of interval cancer and missing colonic polyps.  Plan   Screening colonoscopy Trial of Ibgard She has hemorroids that she recalls and would like it to be banded in the future 4.  At next visit if the pain persists and colonoscopy if negative will consider commencing on amitriptyline as a pain modulator.   I have discussed alternative options, risks & benefits,  which include, but are not limited to, bleeding, infection, perforation,respiratory complication & drug reaction.  The patient agrees with this plan & written consent will be obtained.      Dr Jonathon Bellows  MD,MRCP [facility]) Follow up in 8 weeks

## 2021-08-14 ENCOUNTER — Ambulatory Visit: Payer: 59 | Admitting: Certified Registered Nurse Anesthetist

## 2021-08-14 ENCOUNTER — Encounter
Admission: RE | Disposition: A | Discharge status: Home / Self Care | Payer: Self-pay | Source: Home / Self Care | Attending: Gastroenterology

## 2021-08-14 ENCOUNTER — Encounter: Payer: Self-pay | Admitting: Gastroenterology

## 2021-08-14 ENCOUNTER — Ambulatory Visit
Admission: RE | Admit: 2021-08-14 | Discharge: 2021-08-14 | Disposition: A | Discharge status: Home / Self Care | Payer: 59 | Attending: Gastroenterology | Admitting: Gastroenterology

## 2021-08-14 DIAGNOSIS — Z882 Allergy status to sulfonamides status: Secondary | ICD-10-CM | POA: Diagnosis not present

## 2021-08-14 DIAGNOSIS — Z1211 Encounter for screening for malignant neoplasm of colon: Secondary | ICD-10-CM | POA: Insufficient documentation

## 2021-08-14 DIAGNOSIS — Z791 Long term (current) use of non-steroidal anti-inflammatories (NSAID): Secondary | ICD-10-CM | POA: Diagnosis not present

## 2021-08-14 DIAGNOSIS — Z803 Family history of malignant neoplasm of breast: Secondary | ICD-10-CM | POA: Insufficient documentation

## 2021-08-14 DIAGNOSIS — Z8371 Family history of colonic polyps: Secondary | ICD-10-CM | POA: Diagnosis not present

## 2021-08-14 DIAGNOSIS — Z841 Family history of disorders of kidney and ureter: Secondary | ICD-10-CM | POA: Diagnosis not present

## 2021-08-14 DIAGNOSIS — Z8349 Family history of other endocrine, nutritional and metabolic diseases: Secondary | ICD-10-CM | POA: Insufficient documentation

## 2021-08-14 DIAGNOSIS — Z823 Family history of stroke: Secondary | ICD-10-CM | POA: Insufficient documentation

## 2021-08-14 DIAGNOSIS — Z8249 Family history of ischemic heart disease and other diseases of the circulatory system: Secondary | ICD-10-CM | POA: Insufficient documentation

## 2021-08-14 DIAGNOSIS — K640 First degree hemorrhoids: Secondary | ICD-10-CM | POA: Diagnosis not present

## 2021-08-14 DIAGNOSIS — Z79899 Other long term (current) drug therapy: Secondary | ICD-10-CM | POA: Insufficient documentation

## 2021-08-14 HISTORY — PX: COLONOSCOPY WITH PROPOFOL: SHX5780

## 2021-08-14 SURGERY — COLONOSCOPY WITH PROPOFOL
Anesthesia: Monitor Anesthesia Care

## 2021-08-14 MED ORDER — PROPOFOL 10 MG/ML IV BOLUS
INTRAVENOUS | Status: DC | PRN
  Administered 2021-08-14: 60 mg via INTRAVENOUS
  Administered 2021-08-14: 20 mg via INTRAVENOUS

## 2021-08-14 MED ORDER — LIDOCAINE HCL (CARDIAC) PF 100 MG/5ML IV SOSY
PREFILLED_SYRINGE | INTRAVENOUS | Status: DC | PRN
  Administered 2021-08-14: 100 mg via INTRAVENOUS

## 2021-08-14 MED ORDER — PROPOFOL 500 MG/50ML IV EMUL
INTRAVENOUS | Status: DC | PRN
  Administered 2021-08-14: 140 ug/kg/min via INTRAVENOUS

## 2021-08-14 MED ORDER — SODIUM CHLORIDE 0.9 % IV SOLN: INTRAVENOUS | Status: DC | PRN

## 2021-08-14 MED ORDER — SODIUM CHLORIDE 0.9 % IV SOLN: INTRAVENOUS | Status: DC

## 2021-08-14 NOTE — Op Note (Signed)
[facility] Gastroenterology Patient Name: Erika Hurley Procedure Date: 08/14/2021 10:27 AM MRN: [id number] Account #: [id number] Date of Birth: 06-04-1966 Admit Type: Outpatient Age: 55 Room: [facility] ENDO ROOM 4 Gender: Female Note Status: Finalized Instrument Name: Park Meo [id number] Procedure:             Colonoscopy Indications:           Colon cancer screening in patient at increased risk:                         Family history of 1st-degree relative with colon polyps Providers:             Jonathon Bellows MD, MD Referring MD:          Jearld Fenton (Referring MD) Medicines:             Monitored Anesthesia Care Complications:         No immediate complications. Procedure:             Pre-Anesthesia Assessment:                        - Prior to the procedure, a History and Physical was                         performed, and patient medications, allergies and                         sensitivities were reviewed. The patient's tolerance                         of previous anesthesia was reviewed.                        - The risks and benefits of the procedure and the                         sedation options and risks were discussed with the                         patient. All questions were answered and informed                         consent was obtained.                        - ASA Grade Assessment: II - A patient with mild                         systemic disease.                        After obtaining informed consent, the colonoscope was                         passed under direct vision. Throughout the procedure,                         the patient's blood pressure, pulse, and oxygen  saturations were monitored continuously. The                         Colonoscope was introduced through the anus and                         advanced to the the cecum, identified by the                         appendiceal orifice. The colonoscopy was  performed                         without difficulty. The patient tolerated the                         procedure well. The quality of the bowel preparation                         was excellent. Findings:      The perianal and digital rectal examinations were normal.      The entire examined colon appeared normal on direct and retroflexion       views.      Non-bleeding internal hemorrhoids were found during retroflexion. The       hemorrhoids were large and Grade I (internal hemorrhoids that do not       prolapse). Impression:            - The entire examined colon is normal on direct and                         retroflexion views.                        - No specimens collected. Recommendation:        - Discharge patient to home (with escort).                        - Resume previous diet.                        - Continue present medications.                        - Repeat colonoscopy in 10 years for screening                         purposes. Procedure Code(s):     --- Professional ---                        [phone], Colonoscopy, flexible; diagnostic, including                         collection of specimen(s) by brushing or washing, when                         performed (separate procedure) Diagnosis Code(s):     --- Professional ---                        Z83.71, Family history of colonic polyps CPT copyright 2019 American Medical Association.  All rights reserved. The codes documented in this report are preliminary and upon coder review may  be revised to meet current compliance requirements. Jonathon Bellows, MD Jonathon Bellows MD, MD 08/14/2021 11:03:41 AM This report has been signed electronically. Number of Addenda: 0 Note Initiated On: 08/14/2021 10:27 AM Scope Withdrawal Time: 0 hours 9 minutes 10 seconds  Total Procedure Duration: 0 hours 14 minutes 31 seconds  Estimated Blood Loss:  Estimated blood loss: none.      [facility]

## 2021-08-14 NOTE — Anesthesia Procedure Notes (Signed)
Date/Time: 08/14/2021 10:41 AM Performed by: Lily Peer, Empress Newmann, CRNA Pre-anesthesia Checklist: Patient identified, Emergency Drugs available, Suction available, Timeout performed and Patient being monitored Patient Re-evaluated:Patient Re-evaluated prior to induction Oxygen Delivery Method: Nasal cannula Induction Type: IV induction

## 2021-08-14 NOTE — Anesthesia Postprocedure Evaluation (Signed)
Anesthesia Post Note  Patient: Erika Hurley  Procedure(s) Performed: COLONOSCOPY WITH PROPOFOL  Patient location during evaluation: Endoscopy Anesthesia Type: MAC Level of consciousness: awake and alert Pain management: pain level controlled Vital Signs Assessment: post-procedure vital signs reviewed and stable Respiratory status: spontaneous breathing, nonlabored ventilation, respiratory function stable and patient connected to nasal cannula oxygen Cardiovascular status: stable and blood pressure returned to baseline Postop Assessment: no apparent nausea or vomiting Anesthetic complications: no   No notable events documented.   Last Vitals:  Vitals:   08/14/21 1007 08/14/21 1101  BP: 129/81 101/60  Pulse: 67   Resp: 16 15  Temp: (!) 36.2 C 36.4 C  SpO2: 100%     Last Pain:  Vitals:   08/14/21 1121  TempSrc:   PainSc: 0-No pain                 Tonny Bollman

## 2021-08-14 NOTE — Transfer of Care (Signed)
Immediate Anesthesia Transfer of Care Note  Patient: Stephania M Lavallie  Procedure(s) Performed: COLONOSCOPY WITH PROPOFOL  Patient Location: Endoscopy Unit  Anesthesia Type:General  Level of Consciousness: drowsy  Airway & Oxygen Therapy: Patient Spontanous Breathing  Post-op Assessment: Report given to RN and Post -op Vital signs reviewed and stable  Post vital signs: Reviewed and stable  Last Vitals:  Vitals Value Taken Time  BP 101/60 08/14/21 1101  Temp    Pulse 64 08/14/21 1101  Resp 14 08/14/21 1101  SpO2 100 % 08/14/21 1101  Vitals shown include unvalidated device data.  Last Pain:  Vitals:   08/14/21 1007  TempSrc: Temporal  PainSc: 0-No pain         Complications: No notable events documented.

## 2021-08-14 NOTE — H&P (Signed)
Jonathon Bellows, MD [address] [place], [place], [place], [address] [address], [place], [place], [place], [address] Phone: [phone]  Fax: [phone]  Primary Care Physician:  Jearld Fenton, NP   Pre-Procedure History & Physical: HPI:  Erika Hurley is a 55 y.o. female is here for an colonoscopy.   Past Medical History:  Diagnosis Date   Arthritis    Cervical dysplasia    History of abnormal cervical Pap smear    HGSIL   Migraines    Solitary cyst of breast    Stroke [facility])     Past Surgical History:  Procedure Laterality Date   BREAST CYST ASPIRATION Right 2005?       ESOPHAGOGASTRODUODENOSCOPY (EGD) WITH PROPOFOL N/A 06/30/2021   Procedure: ESOPHAGOGASTRODUODENOSCOPY (EGD) WITH PROPOFOL;  Surgeon: Jonathon Bellows, MD;  Location: [facility] ENDOSCOPY;  Service: Gastroenterology;  Laterality: N/A;   LEEP  2004   spinal tap  2012    Prior to Admission medications   Medication Sig Start Date End Date Taking? Authorizing Provider  esomeprazole (NEXIUM) 40 MG capsule Take 40 mg by mouth daily at 12 noon.   Yes [provider]  Melatonin 3 MG TABS Take 30 mg by mouth.   Yes [provider]  meloxicam (MOBIC) 15 MG tablet meloxicam 15 mg tablet  Take 1 tablet every day by oral route.   Yes [provider]  rosuvastatin (CRESTOR) 5 MG tablet Take 1 tablet (5 mg total) by mouth daily. 05/17/21  Yes Baity, Coralie Keens, NP  SUMAtriptan (IMITREX) 50 MG tablet TAKE ONE TABLET BY MOUTH EVERY TWO HOURS AS NEEDED FOR MIGRAINE OR HEADACHE. MAY REPEAT IN TWO HOURS IF HEADACHE PERSISTS OR RECURS 05/17/21  Yes Jearld Fenton, NP    Allergies as of 07/25/2021 - Review Complete 07/25/2021  Allergen Reaction Noted   Sulfa antibiotics Anaphylaxis 03/02/2013    Family History  Problem Relation Age of Onset   Cancer Mother    Hypertension Mother    Hyperlipidemia Mother    Kidney disease Mother    Breast cancer Mother 49       twice   Stroke Father     Heart disease Father    Hypertension Father    Hyperlipidemia Father    Kidney disease Father    Cancer Maternal Grandfather     Social History   Socioeconomic History   Marital status: Soil scientist    Spouse name: Not on file   Number of children: Not on file   Years of education: Not on file   Highest education level: Not on file  Occupational History   Not on file  Tobacco Use   Smoking status: Never   Smokeless tobacco: Never  Vaping Use   Vaping Use: Never used  Substance and Sexual Activity   Alcohol use: Yes    Alcohol/week: 2.0 standard drinks    Types: 2 Glasses of wine per week   Drug use: No   Sexual activity: Yes    Partners: Male    Birth control/protection: Post-menopausal  Other Topics Concern   Not on file  Social History Narrative   Divorced    Employed Engineer, manufacturing systems    Social Determinants of Health   Financial Resource Strain: Not on file  Food Insecurity: Not on file  Transportation Needs: Not on file  Physical Activity: Not on file  Stress: Not on file  Social Connections: Not on file  Intimate Partner Violence:  Not on file    Review of Systems: See HPI, otherwise negative ROS  Physical Exam: BP 129/81   Pulse 67   Temp (!) 97.2 F (36.2 C) (Temporal)   Resp 16   Ht 5\' 9"  (1.753 m)   Wt 52.3 kg   SpO2 100%   BMI 17.03 kg/m  General:   Alert,  pleasant and cooperative in NAD Head:  Normocephalic and atraumatic. Neck:  Supple; no masses or thyromegaly. Lungs:  Clear throughout to auscultation, normal respiratory effort.    Heart:  +S1, +S2, Regular rate and rhythm, No edema. Abdomen:  Soft, nontender and nondistended. Normal bowel sounds, without guarding, and without rebound.   Neurologic:  Alert and  oriented x4;  grossly normal neurologically.  Impression/Plan: Erika Hurley is here for an colonoscopy to be performed for Screening colonoscopy average risk   Risks, benefits, limitations, and alternatives  regarding  colonoscopy have been reviewed with the patient.  Questions have been answered.  All parties agreeable.   Jonathon Bellows, MD  08/14/2021, 10:28 AM

## 2021-08-14 NOTE — Anesthesia Preprocedure Evaluation (Signed)
Anesthesia Evaluation  Patient identified by MRN, date of birth, ID band Patient awake    Reviewed: Allergy & Precautions, NPO status , Patient's Chart, lab work & pertinent test results  History of Anesthesia Complications Negative for: history of anesthetic complications  Airway Mallampati: II  TM Distance: >3 FB Neck ROM: Full    Dental no notable dental hx. (+) Teeth Intact   Pulmonary neg pulmonary ROS,    Pulmonary exam normal breath sounds clear to auscultation       Cardiovascular Exercise Tolerance: Good METS: 3 - Mets negative cardio ROS Normal cardiovascular exam Rhythm:Regular Rate:Normal     Neuro/Psych  Headaches, PSYCHIATRIC DISORDERS Anxiety CVA (M[place]strokes- no residual or etiology), No Residual Symptoms    GI/Hepatic negative GI ROS, Neg liver ROS,   Endo/Other  negative endocrine ROS  Renal/GU negative Renal ROS  negative genitourinary   Musculoskeletal  (+) Arthritis ,   Abdominal   Peds  Hematology negative hematology ROS (+)   Anesthesia Other Findings   Reproductive/Obstetrics negative OB ROS                             Anesthesia Physical Anesthesia Plan  ASA: 2  Anesthesia Plan: MAC   Post-op Pain Management:    Induction: Intravenous  PONV Risk Score and Plan:   Airway Management Planned: Natural Airway and Nasal Cannula  Additional Equipment:   Intra-op Plan:   Post-operative Plan:   Informed Consent: I have reviewed the patients History and Physical, chart, labs and discussed the procedure including the risks, benefits and alternatives for the proposed anesthesia with the patient or authorized representative who has indicated his/her understanding and acceptance.     Dental Advisory Given  Plan Discussed with: Anesthesiologist, CRNA and Surgeon  Anesthesia Plan Comments: (Patient consented for risks of anesthesia including but not limited  to:  - adverse reactions to medications - damage to eyes, teeth, lips or other oral mucosa - nerve damage due to positioning  - sore throat or hoarseness - Damage to heart, brain, nerves, lungs, other parts of body or loss of life  Patient voiced understanding.)        Anesthesia Quick Evaluation

## 2021-08-15 ENCOUNTER — Encounter: Payer: Self-pay | Admitting: Gastroenterology

## 2021-08-15 ENCOUNTER — Encounter: Payer: Self-pay | Admitting: Internal Medicine

## 2021-08-22 ENCOUNTER — Other Ambulatory Visit: Payer: Self-pay

## 2021-08-22 DIAGNOSIS — E782 Mixed hyperlipidemia: Secondary | ICD-10-CM

## 2021-08-23 ENCOUNTER — Other Ambulatory Visit: Payer: 59

## 2021-08-23 ENCOUNTER — Other Ambulatory Visit: Payer: Self-pay

## 2021-08-24 LAB — LIPID PANEL
Cholesterol: 185 mg/dL (ref <200)
HDL: 55 mg/dL (ref >=50)
LDL Cholesterol (Calc): 111 mg/dL (calc) — ABNORMAL HIGH
Non-HDL Cholesterol (Calc): 130 mg/dL (calc) — ABNORMAL HIGH (ref <130)
Total CHOL/HDL Ratio: 3.4 (calc) (ref <5.0)
Triglycerides: 90 mg/dL (ref <150)

## 2021-08-24 LAB — HEMOGLOBIN A1C W/OUT EAG: Hgb A1c MFr Bld: 5.4 % of total Hgb (ref <5.7)

## 2021-08-28 ENCOUNTER — Other Ambulatory Visit: Payer: Self-pay | Admitting: Obstetrics & Gynecology

## 2021-08-28 DIAGNOSIS — Z1231 Encounter for screening mammogram for malignant neoplasm of breast: Secondary | ICD-10-CM

## 2021-09-07 ENCOUNTER — Encounter: Payer: Self-pay | Admitting: Internal Medicine

## 2021-09-14 ENCOUNTER — Other Ambulatory Visit: Payer: Self-pay

## 2021-09-14 ENCOUNTER — Ambulatory Visit
Admission: RE | Admit: 2021-09-14 | Discharge: 2021-09-14 | Disposition: A | Discharge status: Home / Self Care | Payer: 59 | Attending: Internal Medicine | Admitting: Internal Medicine

## 2021-09-14 ENCOUNTER — Encounter: Payer: Self-pay | Admitting: Internal Medicine

## 2021-09-14 ENCOUNTER — Ambulatory Visit (INDEPENDENT_AMBULATORY_CARE_PROVIDER_SITE_OTHER): Payer: 59 | Admitting: Internal Medicine

## 2021-09-14 ENCOUNTER — Ambulatory Visit
Admission: RE | Admit: 2021-09-14 | Discharge: 2021-09-14 | Disposition: A | Discharge status: Home / Self Care | Payer: 59 | Source: Ambulatory Visit | Attending: Internal Medicine | Admitting: Internal Medicine

## 2021-09-14 VITALS — BP 97/68 | HR 72 | Temp 97.7°F | Resp 17 | Ht 69.0 in | Wt 120.6 lb

## 2021-09-14 DIAGNOSIS — Z0001 Encounter for general adult medical examination with abnormal findings: Secondary | ICD-10-CM | POA: Diagnosis not present

## 2021-09-14 DIAGNOSIS — R634 Abnormal weight loss: Secondary | ICD-10-CM

## 2021-09-14 NOTE — Progress Notes (Signed)
Subjective:    Patient ID: Erika Hurley, female    DOB: 1966/09/29, 55 y.o.   MRN: [id number]  HPI  Pt presents to the clinic today for her annual exam.  She does have a biometric screening form that she will need filled out today.  Flu: 08/2019 Tetanus: 05/2017 Covid: never Shingrix: 2 doses, [facility] Pap smear: 10/2019 Mammogram: 10/2020, scheduled Bone density: > 5 years ago Colon screening: 08/2021 Vision screening: annually Dentist: bianually  Diet: She does eat meat. She consumes fruits and veggies. She tries to avoid fried foods. She drinks mostly water, lemonade or Mt. Dew. Exercise: None   Review of Systems     Past Medical History:  Diagnosis Date   Arthritis    Cervical dysplasia    History of abnormal cervical Pap smear    HGSIL   Migraines    Solitary cyst of breast    Stroke [facility])     Current Outpatient Medications  Medication Sig Dispense Refill   Melatonin 3 MG TABS Take 30 mg by mouth.     meloxicam (MOBIC) 15 MG tablet meloxicam 15 mg tablet  Take 1 tablet every day by oral route.     rosuvastatin (CRESTOR) 5 MG tablet Take 1 tablet (5 mg total) by mouth daily. 90 tablet 3   SUMAtriptan (IMITREX) 50 MG tablet TAKE ONE TABLET BY MOUTH EVERY TWO HOURS AS NEEDED FOR MIGRAINE OR HEADACHE. MAY REPEAT IN TWO HOURS IF HEADACHE PERSISTS OR RECURS 10 tablet 5   esomeprazole (NEXIUM) 40 MG capsule Take 40 mg by mouth daily at 12 noon. (Patient not taking: Reported on 09/14/2021)     No current facility-administered medications for this visit.    Allergies  Allergen Reactions   Sulfa Antibiotics Anaphylaxis    Family History  Problem Relation Age of Onset   Cancer Mother    Hypertension Mother    Hyperlipidemia Mother    Kidney disease Mother    Breast cancer Mother 37       twice   Stroke Father    Heart disease Father    Hypertension Father    Hyperlipidemia Father    Kidney disease Father    Cancer Maternal Grandfather     Social  History   Socioeconomic History   Marital status: Soil scientist    Spouse name: Not on file   Number of children: Not on file   Years of education: Not on file   Highest education level: Not on file  Occupational History   Not on file  Tobacco Use   Smoking status: Never   Smokeless tobacco: Never  Vaping Use   Vaping Use: Never used  Substance and Sexual Activity   Alcohol use: Yes    Alcohol/week: 2.0 standard drinks    Types: 2 Glasses of wine per week   Drug use: No   Sexual activity: Yes    Partners: Male    Birth control/protection: Post-menopausal  Other Topics Concern   Not on file  Social History Narrative   Divorced    Employed Engineer, manufacturing systems    Social Determinants of Health   Financial Resource Strain: Not on file  Food Insecurity: Not on file  Transportation Needs: Not on file  Physical Activity: Not on file  Stress: Not on file  Social Connections: Not on file  Intimate Partner Violence: Not on file     Constitutional: Pt reports intermittent headaches, unintentional weight loss. Denies fever, malaise, fatigue.  HEENT:  Denies eye pain, eye redness, ear pain, ringing in the ears, wax buildup, runny nose, nasal congestion, bloody nose, or sore throat. Respiratory: Denies difficulty breathing, shortness of breath, cough or sputum production.   Cardiovascular: Denies chest pain, chest tightness, palpitations or swelling in the hands or feet.  Gastrointestinal: Denies abdominal pain, bloating, constipation, diarrhea or blood in the stool.  GU: Denies urgency, frequency, pain with urination, burning sensation, blood in urine, odor or discharge. Musculoskeletal: Denies decrease in range of motion, difficulty with gait, muscle pain or joint pain and swelling.  Skin: Denies redness, rashes, lesions or ulcercations.  Neurological: Pt reports insomnia. Denies dizziness, difficulty with memory, difficulty with speech or problems with balance and  coordination.  Psych: Pt has a history of anxiety. Denies depression, SI/HI.  No other specific complaints in a complete review of systems (except as listed in HPI above).  Objective:   Physical Exam   BP 97/68 (BP Location: Right Arm, Patient Position: Sitting, Cuff Size: Small)   Pulse 72   Temp 97.7 F (36.5 C) (Temporal)   Resp 17   Ht 5\' 9"  (1.753 m)   Wt 120 lb 9.6 oz (54.7 kg)   SpO2 100%   BMI 17.81 kg/m  Wt Readings from Last 3 Encounters:  09/14/21 120 lb 9.6 oz (54.7 kg)  08/14/21 115 lb 5.5 oz (52.3 kg)  07/25/21 122 lb (55.3 kg)    General: Appears her stated age, underweight, in NAD. Skin: Warm, dry and intact.  HEENT: Head: normal shape and size; Eyes: sclera white and EOMs intact;  Neck:  Neck supple, trachea midline. No masses, lumps or thyromegaly present.  Cardiovascular: Normal rate and rhythm. S1,S2 noted.  No murmur, rubs or gallops noted. No JVD or BLE edema. No carotid bruits noted. Pulmonary/Chest: Normal effort and positive vesicular breath sounds. No respiratory distress. No wheezes, rales or ronchi noted.  Abdomen: Soft and nontender. Normal bowel sounds. No distention or masses noted. Liver, spleen and kidneys non palpable. Musculoskeletal: Strength 5/5 BUE/BLE.  No difficulty with gait.  Neurological: Alert and oriented. Cranial nerves II-XII grossly intact. Coordination normal.  Psychiatric: Mood and affect normal. Behavior is normal. Judgment and thought content normal.   BMET    Component Value Date/Time   NA 143 05/17/2021 0848   K 4.8 05/17/2021 0848   CL 106 05/17/2021 0848   CO2 29 05/17/2021 0848   GLUCOSE 91 05/17/2021 0848   BUN 16 05/17/2021 0848   CREATININE 0.96 05/17/2021 0848   CALCIUM 9.6 05/17/2021 0848   GFRNONAA >60 02/03/2021 1836   GFRNONAA 71 09/12/2020 1005   GFRAA 83 09/12/2020 1005    Lipid Panel     Component Value Date/Time   CHOL 185 08/23/2021 0803   TRIG 90 08/23/2021 0803   HDL 55 08/23/2021 0803    CHOLHDL 3.4 08/23/2021 0803   VLDL 16.6 02/23/2016 0905   LDLCALC 111 (H) 08/23/2021 0803    CBC    Component Value Date/Time   WBC 6.3 05/17/2021 0848   RBC 4.34 05/17/2021 0848   HGB 13.9 05/17/2021 0848   HCT 41.1 05/17/2021 0848   PLT 264 05/17/2021 0848   MCV 94.7 05/17/2021 0848   MCH 32.0 05/17/2021 0848   MCHC 33.8 05/17/2021 0848   RDW 11.8 05/17/2021 0848   LYMPHSABS 1,926 09/12/2020 1005   MONOABS 0.4 09/15/2015 1028   EOSABS 29 09/12/2020 1005   BASOSABS 29 09/12/2020 1005    Hgb A1C Lab Results  Component  Value Date   HGBA1C 5.4 08/23/2021           Assessment & Plan:   Preventative Health Maintenance:  She declines flu shot Tetanus UTD She declines COVID-vaccine Shingrix vaccine UTD per her report, will call to verify this Pap smear due 2023-2025, follows with GYN Mammogram scheduled Advised her to talk to GYN about bone density screening Colon screening UTD Encouraged her to consume a balanced diet and exercise regimen Advised her to see an eye doctor and dentist annually No labs indicated at this time, just done 3 months ago  Unintentional Weight Loss:  Has had a negative GI work-up Has appointment with GYN scheduled, mammogram ordered Labs including TSH, HIV, hep C unremarkable Will obtain chest x-ray today  RTC in 6 months, follow-up chronic conditions  Webb Silversmith, NP This visit occurred during the SARS-CoV-2 public health emergency.  Safety protocols were in place, including screening questions prior to the visit, additional usage of staff PPE, and extensive cleaning of exam room while observing appropriate contact time as indicated for disinfecting solutions.

## 2021-09-14 NOTE — Patient Instructions (Signed)
Health Maintenance for Postmenopausal Women ?Menopause is a normal process in which your ability to get pregnant comes to an end. This process happens slowly over many months or years, usually between the ages of 48 and 55. Menopause is complete when you have missed your menstrual period for 12 months. ?It is important to talk with your health care provider about some of the most common conditions that affect women after menopause (postmenopausal women). These include heart disease, cancer, and bone loss (osteoporosis). Adopting a healthy lifestyle and getting preventive care can help to promote your health and wellness. The actions you take can also lower your chances of developing some of these common conditions. ?What are the signs and symptoms of menopause? ?During menopause, you may have the following symptoms: ?Hot flashes. These can be moderate or severe. ?Night sweats. ?Decrease in sex drive. ?Mood swings. ?Headaches. ?Tiredness (fatigue). ?Irritability. ?Memory problems. ?Problems falling asleep or staying asleep. ?Talk with your health care provider about treatment options for your symptoms. ?Do I need hormone replacement therapy? ?Hormone replacement therapy is effective in treating symptoms that are caused by menopause, such as hot flashes and night sweats. ?Hormone replacement carries certain risks, especially as you become older. If you are thinking about using estrogen or estrogen with progestin, discuss the benefits and risks with your health care provider. ?How can I reduce my risk for heart disease and stroke? ?The risk of heart disease, heart attack, and stroke increases as you age. One of the causes may be a change in the body's hormones during menopause. This can affect how your body uses dietary fats, triglycerides, and cholesterol. Heart attack and stroke are medical emergencies. There are many things that you can do to help prevent heart disease and stroke. ?Watch your blood pressure ?High  blood pressure causes heart disease and increases the risk of stroke. This is more likely to develop in people who have high blood pressure readings or are overweight. ?Have your blood pressure checked: ?Every 3-5 years if you are 18-39 years of age. ?Every year if you are 40 years old or older. ?Eat a healthy diet ? ?Eat a diet that includes plenty of vegetables, fruits, low-fat dairy products, and lean protein. ?Do not eat a lot of foods that are high in solid fats, added sugars, or sodium. ?Get regular exercise ?Get regular exercise. This is one of the most important things you can do for your health. Most adults should: ?Try to exercise for at least 150 minutes each week. The exercise should increase your heart rate and make you sweat (moderate-intensity exercise). ?Try to do strengthening exercises at least twice each week. Do these in addition to the moderate-intensity exercise. ?Spend less time sitting. Even light physical activity can be beneficial. ?Other tips ?Work with your health care provider to achieve or maintain a healthy weight. ?Do not use any products that contain nicotine or tobacco. These products include cigarettes, chewing tobacco, and vaping devices, such as e-cigarettes. If you need help quitting, ask your health care provider. ?Know your numbers. Ask your health care provider to check your cholesterol and your blood sugar (glucose). Continue to have your blood tested as directed by your health care provider. ?Do I need screening for cancer? ?Depending on your health history and family history, you may need to have cancer screenings at different stages of your life. This may include screening for: ?Breast cancer. ?Cervical cancer. ?Lung cancer. ?Colorectal cancer. ?What is my risk for osteoporosis? ?After menopause, you may be   at increased risk for osteoporosis. Osteoporosis is a condition in which bone destruction happens more quickly than new bone creation. To help prevent osteoporosis or  the bone fractures that can happen because of osteoporosis, you may take the following actions: ?If you are 19-50 years old, get at least 1,000 mg of calcium and at least 600 international units (IU) of vitamin D per day. ?If you are older than age 50 but younger than age 70, get at least 1,200 mg of calcium and at least 600 international units (IU) of vitamin D per day. ?If you are older than age 70, get at least 1,200 mg of calcium and at least 800 international units (IU) of vitamin D per day. ?Smoking and drinking excessive alcohol increase the risk of osteoporosis. Eat foods that are rich in calcium and vitamin D, and do weight-bearing exercises several times each week as directed by your health care provider. ?How does menopause affect my mental health? ?Depression may occur at any age, but it is more common as you become older. Common symptoms of depression include: ?Feeling depressed. ?Changes in sleep patterns. ?Changes in appetite or eating patterns. ?Feeling an overall lack of motivation or enjoyment of activities that you previously enjoyed. ?Frequent crying spells. ?Talk with your health care provider if you think that you are experiencing any of these symptoms. ?General instructions ?See your health care provider for regular wellness exams and vaccines. This may include: ?Scheduling regular health, dental, and eye exams. ?Getting and maintaining your vaccines. These include: ?Influenza vaccine. Get this vaccine each year before the flu season begins. ?Pneumonia vaccine. ?Shingles vaccine. ?Tetanus, diphtheria, and pertussis (Tdap) booster vaccine. ?Your health care provider may also recommend other immunizations. ?Tell your health care provider if you have ever been abused or do not feel safe at home. ?Summary ?Menopause is a normal process in which your ability to get pregnant comes to an end. ?This condition causes hot flashes, night sweats, decreased interest in sex, mood swings, headaches, or lack  of sleep. ?Treatment for this condition may include hormone replacement therapy. ?Take actions to keep yourself healthy, including exercising regularly, eating a healthy diet, watching your weight, and checking your blood pressure and blood sugar levels. ?Get screened for cancer and depression. Make sure that you are up to date with all your vaccines. ?This information is not intended to replace advice given to you by your health care provider. Make sure you discuss any questions you have with your health care provider. ?Document Revised: 03/13/2021 Document Reviewed: 03/13/2021 ?Elsevier Patient Education ? 2022 Elsevier Inc. ? ?

## 2021-09-14 NOTE — Assessment & Plan Note (Signed)
She declines appetite stimulant at this time Encourage high-calorie, high-protein diet

## 2021-09-18 ENCOUNTER — Other Ambulatory Visit: Payer: Self-pay

## 2021-09-18 ENCOUNTER — Ambulatory Visit (INDEPENDENT_AMBULATORY_CARE_PROVIDER_SITE_OTHER): Payer: 59 | Admitting: Dermatology

## 2021-09-18 DIAGNOSIS — L988 Other specified disorders of the skin and subcutaneous tissue: Secondary | ICD-10-CM | POA: Diagnosis not present

## 2021-09-18 DIAGNOSIS — L570 Actinic keratosis: Secondary | ICD-10-CM

## 2021-09-18 DIAGNOSIS — D229 Melanocytic nevi, unspecified: Secondary | ICD-10-CM

## 2021-09-18 DIAGNOSIS — Z1283 Encounter for screening for malignant neoplasm of skin: Secondary | ICD-10-CM

## 2021-09-18 DIAGNOSIS — D225 Melanocytic nevi of trunk: Secondary | ICD-10-CM | POA: Diagnosis not present

## 2021-09-18 DIAGNOSIS — L578 Other skin changes due to chronic exposure to nonionizing radiation: Secondary | ICD-10-CM

## 2021-09-18 DIAGNOSIS — L821 Other seborrheic keratosis: Secondary | ICD-10-CM

## 2021-09-18 DIAGNOSIS — L814 Other melanin hyperpigmentation: Secondary | ICD-10-CM

## 2021-09-18 NOTE — Patient Instructions (Addendum)

## 2021-09-18 NOTE — Progress Notes (Signed)
Follow-Up Visit   Subjective  Erika Hurley is a 55 y.o. female who presents for the following: Annual Exam.  Patient here for TBSE. She has a few rough spots on her shoulder, back, and her upper arms, some itchy. She has a history of AK vs ISKs treated with LN2.   The following portions of the chart were reviewed this encounter and updated as appropriate:       Review of Systems:  No other skin or systemic complaints except as noted in HPI or Assessment and Plan.  Objective  Well appearing patient in no apparent distress; mood and affect are within normal limits.  A full examination was performed including scalp, head, eyes, ears, nose, lips, neck, chest, axillae, abdomen, back, buttocks, bilateral upper extremities, bilateral lower extremities, hands, feet, fingers, toes, fingernails, and toenails. All findings within normal limits unless otherwise noted below.  Left Flank at [place] 4.78mm speckled brown macule  R upper arm x 2, L upper back x 2, L upper arm x 4, R clavicle x 1 (9) Pink scaly macules.  glabella, forehead, crow's feet Rhytides and volume loss.    Assessment & Plan  Skin cancer screening performed today.  Actinic Damage - chronic, secondary to cumulative UV radiation exposure/sun exposure over time - diffuse scaly erythematous macules with underlying dyspigmentation - Recommend daily broad spectrum sunscreen SPF 30+ to sun-exposed areas, reapply every 2 hours as needed.  - Recommend staying in the shade or wearing long sleeves, sun glasses (UVA+UVB protection) and wide brim hats (4-inch brim around the entire circumference of the hat). - Call for new or changing lesions.  Lentigines - Scattered tan macules - Due to sun exposure - Benign-appering, observe - Recommend daily broad spectrum sunscreen SPF 30+ to sun-exposed areas, reapply every 2 hours as needed. - Call for any changes  Seborrheic Keratoses - Stuck-on, waxy, tan-brown papules and/or  plaques  - Benign-appearing - Discussed benign etiology and prognosis. - Observe - Call for any changes  Melanocytic Nevi - Tan-brown and/or pink-flesh-colored symmetric macules and papules - Benign appearing on exam today - Observation - Call clinic for new or changing moles - Recommend daily use of broad spectrum spf 30+ sunscreen to sun-exposed areas.   Nevus Left Flank at [facility].  Stable. Observation.  Call clinic for new or changing moles.  Recommend daily use of broad spectrum spf 30+ sunscreen to sun-exposed areas.   AK (actinic keratosis) (9) R upper arm x 2, L upper back x 2, L upper arm x 4, R clavicle x 1  vs ISKs  Actinic keratoses are precancerous spots that appear secondary to cumulative UV radiation exposure/sun exposure over time. They are chronic with expected duration over 1 year. A portion of actinic keratoses will progress to squamous cell carcinoma of the skin. It is not possible to reliably predict which spots will progress to skin cancer and so treatment is recommended to prevent development of skin cancer.  Recommend daily broad spectrum sunscreen SPF 30+ to sun-exposed areas, reapply every 2 hours as needed.  Recommend staying in the shade or wearing long sleeves, sun glasses (UVA+UVB protection) and wide brim hats (4-inch brim around the entire circumference of the hat). Call for new or changing lesions.  Destruction of lesion - R upper arm x 2, L upper back x 2, L upper arm x 4, R clavicle x 1  Destruction method: cryotherapy   Informed consent: discussed and consent obtained   Lesion destroyed  using liquid nitrogen: Yes   Region frozen until ice ball extended beyond lesion: Yes   Outcome: patient tolerated procedure well with no complications   Post-procedure details: wound care instructions given   Additional details:  Prior to procedure, discussed risks of blister formation, small wound, skin dyspigmentation, or rare scar following  cryotherapy. Recommend Vaseline ointment to treated areas while healing.   Elastosis of skin glabella, forehead, crow's feet  Discussed Botox injections, $13/unit. Patient may schedule. Plan to start with glabella, possible forehead also. (20-25 units)  Return in about 1 year (around 09/18/2022) for TBSE. Also Botox injections per pt.  IJamesetta Orleans, CMA, am acting as scribe for Brendolyn Patty, MD . Documentation: I have reviewed the above documentation for accuracy and completeness, and I agree with the above.  Brendolyn Patty MD

## 2021-09-20 ENCOUNTER — Encounter: Payer: Self-pay | Admitting: Internal Medicine

## 2021-09-22 ENCOUNTER — Encounter: Payer: Self-pay | Admitting: Gastroenterology

## 2021-09-26 ENCOUNTER — Ambulatory Visit: Payer: 59 | Admitting: Gastroenterology

## 2021-10-09 ENCOUNTER — Encounter: Payer: Self-pay | Admitting: Gastroenterology

## 2021-10-09 ENCOUNTER — Ambulatory Visit (INDEPENDENT_AMBULATORY_CARE_PROVIDER_SITE_OTHER): Payer: 59 | Admitting: Gastroenterology

## 2021-10-09 VITALS — BP 113/74 | HR 66 | Temp 98.3°F | Wt 119.0 lb

## 2021-10-09 DIAGNOSIS — K30 Functional dyspepsia: Secondary | ICD-10-CM

## 2021-10-09 DIAGNOSIS — K648 Other hemorrhoids: Secondary | ICD-10-CM | POA: Diagnosis not present

## 2021-10-09 NOTE — Progress Notes (Signed)
Erika Bellows MD, MRCP(U.K) [address]  [place], [place] [address]  Main: [phone]  Fax: [phone]   Primary Care Physician: Jearld Fenton, NP  Primary Gastroenterologist:  Dr. Jonathon Hurley   Chief complaint: Follow-up for abdominal pain.  HPI: Erika Hurley is a 55 y.o. female  Summary of history :   Initially referred and seen on 06/08/2021 for abdominal pain .    05/17/2021 : H. pylori breath test negative, HbA1c 5.7, hemoglobin 13.9 g, CMP normal, TSH normal. She had been losing weight intentionally. she had a CT renal stone protocol which showed no gross abnormality.  She has had an ultrasound of the gallbladder in April 2022 which showed no abnormality. 06/30/2021: EGD: Normal appearance.Biopsies of the stomach were normal  06/26/2021: HIDA scan : Normal   Continued to have epigastric pain not relieved after a bowel movement.  She does have pain that is relieved sometimes after eating food.  The Nexium has not made much of a difference.     Interval history   07/25/2021-10/09/2021   08/14/2021: Colonoscopy: Colonoscopy was normal except for internal hemorrhoids seen on retroflexion. Medium to large in size abdominal pain has improved.  All began after an episode of COVID.  No rectal bleeding.    Current Outpatient Medications  Medication Sig Dispense Refill   Melatonin 3 MG TABS Take 30 mg by mouth.     rosuvastatin (CRESTOR) 5 MG tablet Take 1 tablet (5 mg total) by mouth daily. 90 tablet 3   SUMAtriptan (IMITREX) 50 MG tablet TAKE ONE TABLET BY MOUTH EVERY TWO HOURS AS NEEDED FOR MIGRAINE OR HEADACHE. MAY REPEAT IN TWO HOURS IF HEADACHE PERSISTS OR RECURS 10 tablet 5   No current facility-administered medications for this visit.    Allergies as of 10/09/2021 - Review Complete 10/09/2021  Allergen Reaction Noted   Sulfa antibiotics Anaphylaxis 03/02/2013    ROS:  General: Negative for anorexia, weight loss, fever, chills, fatigue,  weakness. ENT: Negative for hoarseness, difficulty swallowing , nasal congestion. CV: Negative for chest pain, angina, palpitations, dyspnea on exertion, peripheral edema.  Respiratory: Negative for dyspnea at rest, dyspnea on exertion, cough, sputum, wheezing.  GI: See history of present illness. GU:  Negative for dysuria, hematuria, urinary incontinence, urinary frequency, nocturnal urination.  Endo: Negative for unusual weight change.    Physical Examination:   BP 113/74   Pulse 66   Temp 98.3 F (36.8 C) (Oral)   Wt 119 lb (54 kg)   BMI 17.57 kg/m   General: Well-nourished, well-developed in no acute distress.  Eyes: No icterus. Conjunctivae pink. Neuro: Alert and oriented x 3.  Grossly intact. Skin: Warm and dry, no jaundice.   Psych: Alert and cooperative, normal mood and affect.   Imaging Studies: DG Chest 2 View  Result Date: 09/14/2021 CLINICAL DATA:  Unintentional weight loss EXAM: CHEST - 2 VIEW COMPARISON:  AE due to its thalamus and FINDINGS: The heart size and mediastinal contours are within normal limits. Both lungs are clear. No pleural effusion. The visualized skeletal structures are unremarkable. IMPRESSION: No active cardiopulmonary disease. Electronically Signed   By: Macy Mis M.D.   On: 09/14/2021 15:11    Assessment and Plan:   Erika Hurley is a 55 y.o. 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  In the past discussed about commencing on a pain modulator such as amitriptyline.  Trial of IBgard given at her last visit.  Did not help.  Pain is not significant at this point of time.  She is not interested in commencing on amitriptyline as it is not severe.  She also had medium to large size internal hemorrhoids.  Presently not symptomatic.  Discussed briefly about conservative management of hemorrhoids patient information provided.  Patient information provided about high-fiber diet.  Discussed that if he does not  respond to conservative management and if the hemorrhoids are still symptomatic then we will consider banding in the office.  She will call us if needed.    Dr Erika Bellows  MD,MRCP [facility]) Follow up in.  As needed

## 2021-10-09 NOTE — Patient Instructions (Signed)
BasicsIn-DepthExpert AnswersMultimediaResourcesNews From [employer] and services [facility] Diet: What is your weight-loss goal? 5-10 lbs, 11-25 lbs, or 25+ lbs Chart of high-fiber foods By [facility] Staff Looking to add more fiber to your diet? Fiber -- along with adequate fluid intake -- moves quickly and relatively easily through your digestive tract and helps it function properly. A high-fiber diet may also help reduce the risk of obesity, heart disease and diabetes.  Women should try to eat at least 21 to 25 grams of fiber a day, while men should aim for 30 to 38 grams a day.  Here's a look at how much dietary fiber is found in some common foods. When buying packaged foods, check the Nutrition Facts label for fiber content. It can vary among brands.  Fruits Serving size Total fiber (grams)* Raspberries 1 cup 8.0 Pear 1 medium 5.5 Apple, with skin 1 medium 4.5 Banana 1 medium 3.0 Orange 1 medium 3.0 Strawberries 1 cup 3.0 Vegetables Serving size Total fiber (grams)* Green peas, boiled 1 cup 9.0 Broccoli, boiled 1 cup chopped 5.0 Turnip greens, boiled 1 cup 5.0 Brussels sprouts, boiled 1 cup 4.0 Potato, with skin, baked 1 medium 4.0 Sweet corn, boiled 1 cup 3.5 Cauliflower, raw 1 cup chopped 2.0 Carrot, raw 1 medium 1.5 Grains Serving size Total fiber (grams)* Spaghetti, whole-wheat, cooked 1 cup 6.0 Barley, pearled, cooked 1 cup 6.0 Bran flakes 3/4 cup 5.5 Quinoa, cooked 1 cup 5.0 Oat bran muffin 1 medium 5.0 Oatmeal, instant, cooked 1 cup 5.0 Popcorn, air-popped 3 cups 3.5 Brown rice, cooked 1 cup 3.5 Bread, whole-wheat 1 slice 2.0 Bread, rye 1 slice 2.0 Legumes, nuts and seeds Serving size Total fiber (grams)* Split peas, boiled 1 cup 16.0 Lentils, boiled 1 cup 15.5 Black beans, boiled 1 cup 15.0 Baked beans, canned 1 cup 10.0 Chia seeds 1 ounce 10.0 Almonds 1 ounce (23 nuts) 3.5 Pistachios 1 ounce (49 nuts) 3.0 Sunflower kernels 1  ounce 3.0 *Rounded to nearest 0.5 gram.  Source: [employer] for [employer], [employer]

## 2021-11-01 ENCOUNTER — Other Ambulatory Visit: Payer: Self-pay

## 2021-11-01 ENCOUNTER — Ambulatory Visit
Admission: RE | Admit: 2021-11-01 | Discharge: 2021-11-01 | Disposition: A | Discharge status: Home / Self Care | Payer: 59 | Source: Ambulatory Visit | Attending: Obstetrics & Gynecology | Admitting: Obstetrics & Gynecology

## 2021-11-01 DIAGNOSIS — Z1231 Encounter for screening mammogram for malignant neoplasm of breast: Secondary | ICD-10-CM | POA: Insufficient documentation

## 2021-11-02 ENCOUNTER — Encounter: Payer: Self-pay | Admitting: Obstetrics & Gynecology

## 2021-11-02 ENCOUNTER — Ambulatory Visit (INDEPENDENT_AMBULATORY_CARE_PROVIDER_SITE_OTHER): Payer: 59 | Admitting: Obstetrics & Gynecology

## 2021-11-02 VITALS — BP 100/70 | Ht 69.0 in | Wt 121.0 lb

## 2021-11-02 DIAGNOSIS — Z01419 Encounter for gynecological examination (general) (routine) without abnormal findings: Secondary | ICD-10-CM

## 2021-11-02 MED ORDER — AMOXICILLIN 250 MG PO CAPS: 500.0000 mg | ORAL_CAPSULE | Freq: Three times a day (TID) | ORAL | 0 refills | Status: DC

## 2021-11-02 NOTE — Patient Instructions (Signed)
Recommendations to boost your immunity to prevent illness such as viral flu and colds, including covid19, are as follows:       - - -  Vitamin K2 and Vitamin D3  - - - Take Vitamin K2 at 200-300 mcg daily (usually 2-3 pills daily of the over the counter formulation). Take Vitamin D3 at 3000-4000 U daily (usually 3-4 pills daily of the over the counter formulation). Studies show that these two at high normal levels in your system are very effective in keeping your immunity so strong and protective that you will be unlikely to contract viral illness such as those listed above.  Dr Kenton Kingfisher

## 2021-11-02 NOTE — Progress Notes (Signed)
HPI:      [name] is a 55 y.o. G1P1001 who LMP was in the past, she presents today for her annual examination.  The patient has no complaints today. No significant bladder or menopausal sx's.  The patient is sexually active. Herlast pap: approximate date 2020 and was normal and last mammogram: approximate date 2022 and was normal.  The patient does perform self breast exams.  There is no notable family history of breast or ovarian cancer in her family. The patient is not taking hormone replacement therapy. Patient denies post-menopausal vaginal bleeding.   The patient has regular exercise: yes. The patient denies current symptoms of depression.    GYN Hx: Last Colonoscopy: <1 yr  ago. Normal.   PMHx: Past Medical History:  Diagnosis Date   Arthritis    Cervical dysplasia    History of abnormal cervical Pap smear    HGSIL   Migraines    Solitary cyst of breast    Stroke [facility])    Past Surgical History:  Procedure Laterality Date   BREAST CYST ASPIRATION Right 2005?       COLONOSCOPY WITH PROPOFOL N/A 08/14/2021   Procedure: COLONOSCOPY WITH PROPOFOL;  Surgeon: Jonathon Bellows, MD;  Location: [facility] ENDOSCOPY;  Service: Gastroenterology;  Laterality: N/A;   ESOPHAGOGASTRODUODENOSCOPY (EGD) WITH PROPOFOL N/A 06/30/2021   Procedure: ESOPHAGOGASTRODUODENOSCOPY (EGD) WITH PROPOFOL;  Surgeon: Jonathon Bellows, MD;  Location: [facility] ENDOSCOPY;  Service: Gastroenterology;  Laterality: N/A;   LEEP  2004   spinal tap  2012   Family History  Problem Relation Age of Onset   Cancer Mother    Hypertension Mother    Hyperlipidemia Mother    Kidney disease Mother    Breast cancer Mother 56       twice   Stroke Father    Heart disease Father    Hypertension Father    Hyperlipidemia Father    Kidney disease Father    Dementia Father    Cancer Maternal Grandfather    Social History   Tobacco Use   Smoking status: Never   Smokeless tobacco: Never  Vaping Use   Vaping Use: Never used   Substance Use Topics   Alcohol use: Yes    Alcohol/week: 2.0 standard drinks    Types: 2 Glasses of wine per week   Drug use: No    Current Outpatient Medications:    amoxicillin (AMOXIL) 250 MG capsule, Take 2 capsules (500 mg total) by mouth 3 (three) times daily. For 7 days., Disp: 42 capsule, Rfl: 0   Melatonin 3 MG TABS, Take 30 mg by mouth., Disp: , Rfl:    rosuvastatin (CRESTOR) 5 MG tablet, Take 1 tablet (5 mg total) by mouth daily., Disp: 90 tablet, Rfl: 3   SUMAtriptan (IMITREX) 50 MG tablet, TAKE ONE TABLET BY MOUTH EVERY TWO HOURS AS NEEDED FOR MIGRAINE OR HEADACHE. MAY REPEAT IN TWO HOURS IF HEADACHE PERSISTS OR RECURS, Disp: 10 tablet, Rfl: 5 Allergies: Sulfa antibiotics  Review of Systems  Constitutional:  Negative for chills, fever and malaise/fatigue.  HENT:  Negative for congestion, sinus pain and sore throat.   Eyes:  Negative for blurred vision and pain.  Respiratory:  Negative for cough and wheezing.   Cardiovascular:  Negative for chest pain and leg swelling.  Gastrointestinal:  Negative for abdominal pain, constipation, diarrhea, heartburn, nausea and vomiting.  Genitourinary:  Negative for dysuria, frequency, hematuria and urgency.  Musculoskeletal:  Negative for back pain, joint pain, myalgias and neck  pain.  Skin:  Negative for itching and rash.  Neurological:  Negative for dizziness, tremors and weakness.  Endo/Heme/Allergies:  Does not bruise/bleed easily.  Psychiatric/Behavioral:  Negative for depression. The patient is not nervous/anxious and does not have insomnia.    Objective: BP 100/70    Ht 5\' 9"  (1.753 m)    Wt 121 lb (54.9 kg)    BMI 17.87 kg/m   Filed Weights   11/02/21 0759  Weight: 121 lb (54.9 kg)   Body mass index is 17.87 kg/m. Physical Exam Constitutional:      General: She is not in acute distress.    Appearance: She is well-developed.  Genitourinary:     Bladder, rectum and urethral meatus normal.     No lesions in the vagina.      Right Labia: No rash, tenderness or lesions.    Left Labia: No tenderness, lesions or rash.    No vaginal bleeding.      Right Adnexa: not tender and no mass present.    Left Adnexa: not tender and no mass present.    No cervical motion tenderness, friability, lesion or polyp.     Uterus is not enlarged.     No uterine mass detected.    Pelvic exam was performed with patient in the lithotomy position.  Breasts:    Right: No mass, skin change or tenderness.     Left: No mass, skin change or tenderness.  HENT:     Head: Normocephalic and atraumatic. No laceration.     Right Ear: Hearing normal.     Left Ear: Hearing normal.     Mouth/Throat:     Pharynx: Uvula midline.  Eyes:     Pupils: Pupils are equal, round, and reactive to light.  Neck:     Thyroid: No thyromegaly.  Cardiovascular:     Rate and Rhythm: Normal rate and regular rhythm.     Heart sounds: No murmur heard.   No friction rub. No gallop.  Pulmonary:     Effort: Pulmonary effort is normal. No respiratory distress.     Breath sounds: Normal breath sounds. No wheezing.  Abdominal:     General: Bowel sounds are normal. There is no distension.     Palpations: Abdomen is soft.     Tenderness: There is no abdominal tenderness. There is no rebound.  Musculoskeletal:        General: Normal range of motion.     Cervical back: Normal range of motion and neck supple.  Neurological:     Mental Status: She is alert and oriented to person, place, and time.     Cranial Nerves: No cranial nerve deficit.  Skin:    General: Skin is warm and dry.  Psychiatric:        Judgment: Judgment normal.  Vitals reviewed.    Assessment: Annual Exam 1. Women's annual routine gynecological examination     Plan:            1.  Cervical Screening-  Pap smear schedule reviewed with patient, Pap smear to be scheduled  2. Breast screening- Exam annually and mammogram scheduled  3. Colonoscopy every 10 years, Hemoccult testing  after age 39  4. Labs managed by PCP  5. Counseling for hormonal therapy: none              6. FRAX - FRAX score for assessing the 10 year probability for fracture calculated and discussed today.  Based on age and score  today, DEXA is not currently scheduled.    F/U  Return for and Annual when due.  Barnett Applebaum, MD, [facility], [facility] 11/02/2021  8:21 AM

## 2021-11-07 ENCOUNTER — Encounter: Payer: Self-pay | Admitting: Internal Medicine

## 2022-01-01 ENCOUNTER — Ambulatory Visit: Payer: Self-pay | Admitting: Dermatology

## 2022-01-16 ENCOUNTER — Encounter: Payer: Self-pay | Admitting: Internal Medicine

## 2022-01-16 DIAGNOSIS — F418 Other specified anxiety disorders: Secondary | ICD-10-CM

## 2022-01-29 ENCOUNTER — Encounter: Payer: Self-pay | Admitting: Internal Medicine

## 2022-03-12 ENCOUNTER — Encounter: Payer: Self-pay | Admitting: Internal Medicine

## 2022-03-12 ENCOUNTER — Telehealth (INDEPENDENT_AMBULATORY_CARE_PROVIDER_SITE_OTHER): Payer: 59 | Admitting: Internal Medicine

## 2022-03-12 DIAGNOSIS — F5104 Psychophysiologic insomnia: Secondary | ICD-10-CM

## 2022-03-12 DIAGNOSIS — F411 Generalized anxiety disorder: Secondary | ICD-10-CM

## 2022-03-12 MED ORDER — TRAZODONE HCL 50 MG PO TABS: 25.0000 mg | ORAL_TABLET | Freq: Every evening | ORAL | 3 refills | Status: DC | PRN

## 2022-03-12 MED ORDER — ALPRAZOLAM 0.25 MG PO TABS: 0.2500 mg | ORAL_TABLET | Freq: Two times a day (BID) | ORAL | 0 refills | Status: DC | PRN

## 2022-03-12 NOTE — Progress Notes (Signed)
Virtual Visit via Video Note ? ?I connected with Erika Hurley on 03/12/22 at  4:00 PM EDT by a video enabled telemedicine application and verified that I am speaking with the correct person using two identifiers. ? ?Location: ?Patient: Home ?Provider: Office ? ?Persons participating in this video call: Webb Silversmith, NP and Tesha Batzel. ?  ?I discussed the limitations of evaluation and management by telemedicine and the availability of in person appointments. The patient expressed understanding and agreed to proceed. ? ?History of Present Illness: ? ?Patient reports increased anxiety and difficulty sleeping. She reports her son has gotten himself into a legal situation which is causing her a lot of stress and anxiety. She is taking Buspar as prescribed. She would like a refill on Xanax today. She is able to fall asleep with use of Melatonin but unable to stay asleep. She has been on Trazodone in the past and wondered if she could get this refilled today as well.  ? ? ?Past Medical History:  ?Diagnosis Date  ? Arthritis   ? Cervical dysplasia   ? History of abnormal cervical Pap smear   ? HGSIL  ? Migraines   ? Solitary cyst of breast   ? Stroke [facility])   ? ? ?Current Outpatient Medications  ?Medication Sig Dispense Refill  ? amoxicillin (AMOXIL) 250 MG capsule Take 2 capsules (500 mg total) by mouth 3 (three) times daily. For 7 days. 42 capsule 0  ? Melatonin 3 MG TABS Take 30 mg by mouth.    ? rosuvastatin (CRESTOR) 5 MG tablet Take 1 tablet (5 mg total) by mouth daily. 90 tablet 3  ? SUMAtriptan (IMITREX) 50 MG tablet TAKE ONE TABLET BY MOUTH EVERY TWO HOURS AS NEEDED FOR MIGRAINE OR HEADACHE. MAY REPEAT IN TWO HOURS IF HEADACHE PERSISTS OR RECURS 10 tablet 5  ? ?No current facility-administered medications for this visit.  ? ? ?Allergies  ?Allergen Reactions  ? Sulfa Antibiotics Anaphylaxis  ? ? ?Family History  ?Problem Relation Age of Onset  ? Cancer Mother   ? Hypertension Mother   ? Hyperlipidemia  Mother   ? Kidney disease Mother   ? Breast cancer Mother 70  ?     twice  ? Stroke Father   ? Heart disease Father   ? Hypertension Father   ? Hyperlipidemia Father   ? Kidney disease Father   ? Dementia Father   ? Cancer Maternal Grandfather   ? ? ?Social History  ? ?Socioeconomic History  ? Marital status: Soil scientist  ?  Spouse name: Not on file  ? Number of children: Not on file  ? Years of education: Not on file  ? Highest education level: Not on file  ?Occupational History  ? Not on file  ?Tobacco Use  ? Smoking status: Never  ? Smokeless tobacco: Never  ?Vaping Use  ? Vaping Use: Never used  ?Substance and Sexual Activity  ? Alcohol use: Yes  ?  Alcohol/week: 2.0 standard drinks  ?  Types: 2 Glasses of wine per week  ? Drug use: No  ? Sexual activity: Yes  ?  Partners: Male  ?  Birth control/protection: Post-menopausal  ?Other Topics Concern  ? Not on file  ?Social History Narrative  ? Divorced   ? Employed Engineer, manufacturing systems   ? ?Social Determinants of Health  ? ?Financial Resource Strain: Not on file  ?Food Insecurity: Not on file  ?Transportation Needs: Not on file  ?Physical Activity: Not on file  ?  Stress: Not on file  ?Social Connections: Not on file  ?Intimate Partner Violence: Not on file  ? ? ? ?Constitutional: Denies fever, malaise, fatigue, headache or abrupt weight changes.  ?Respiratory: Denies difficulty breathing, shortness of breath, cough or sputum production.   ?Cardiovascular: Denies chest pain, chest tightness, palpitations or swelling in the hands or feet.  ?Skin: Denies redness, rashes, lesions or ulcercations.  ?Neurological: Pt reports insomnia. Denies dizziness, difficulty with memory, difficulty with speech or problems with balance and coordination.  ?Psych: Pt reports anxiety. Denies depression, SI/HI. ? ?No other specific complaints in a complete review of systems (except as listed in HPI above). ? ?  ?Observations/Objective: ? ?Wt Readings from Last 3 Encounters:  ?11/02/21  121 lb (54.9 kg)  ?10/09/21 119 lb (54 kg)  ?09/14/21 120 lb 9.6 oz (54.7 kg)  ? ? ?General: Appears her stated age, well developed, well nourished in NAD. ?Pulmonary/Chest: Normal effort. No respiratory distress.  ?Neurological: Alert and oriented.  ?Psychiatric: Mood and affect normal. Mildly anxious appearing. Judgment and thought content normal.  ? ? ? ?BMET ?   ?Component Value Date/Time  ? NA 143 05/17/2021 0848  ? K 4.8 05/17/2021 0848  ? CL 106 05/17/2021 0848  ? CO2 29 05/17/2021 0848  ? GLUCOSE 91 05/17/2021 0848  ? BUN 16 05/17/2021 0848  ? CREATININE 0.96 05/17/2021 0848  ? CALCIUM 9.6 05/17/2021 0848  ? GFRNONAA >60 02/03/2021 1836  ? GFRNONAA 71 09/12/2020 1005  ? GFRAA 83 09/12/2020 1005  ? ? ?Lipid Panel  ?   ?Component Value Date/Time  ? CHOL 185 08/23/2021 0803  ? TRIG 90 08/23/2021 0803  ? HDL 55 08/23/2021 0803  ? CHOLHDL 3.4 08/23/2021 0803  ? VLDL 16.6 02/23/2016 0905  ? LDLCALC 111 (H) 08/23/2021 0803  ? ? ?CBC ?   ?Component Value Date/Time  ? WBC 6.3 05/17/2021 0848  ? RBC 4.34 05/17/2021 0848  ? HGB 13.9 05/17/2021 0848  ? HCT 41.1 05/17/2021 0848  ? PLT 264 05/17/2021 0848  ? MCV 94.7 05/17/2021 0848  ? MCH 32.0 05/17/2021 0848  ? MCHC 33.8 05/17/2021 0848  ? RDW 11.8 05/17/2021 0848  ? LYMPHSABS 1,926 09/12/2020 1005  ? MONOABS 0.4 09/15/2015 1028  ? EOSABS 29 09/12/2020 1005  ? BASOSABS 29 09/12/2020 1005  ? ? ?Hgb A1C ?Lab Results  ?Component Value Date  ? HGBA1C 5.4 08/23/2021  ? ? ? ? ? ? ?Assessment and Plan: ? ? ?Follow Up Instructions: ? ?  ?I discussed the assessment and treatment plan with the patient. The patient was provided an opportunity to ask questions and all were answered. The patient agreed with the plan and demonstrated an understanding of the instructions. ?  ?The patient was advised to call back or seek an in-person evaluation if the symptoms worsen or if the condition fails to improve as anticipated. ? ? ?Webb Silversmith, NP ? ?

## 2022-03-12 NOTE — Assessment & Plan Note (Signed)
Will refill her Trazodone ?Will monitor ?

## 2022-03-12 NOTE — Assessment & Plan Note (Signed)
Deteriorated due to situational stress ?Continue Buspar as needed ?Xanax refilled today, temporary supply ?Support offered ?

## 2022-03-12 NOTE — Patient Instructions (Signed)
Insomnia Insomnia is a sleep disorder that makes it difficult to fall asleep or stay asleep. Insomnia can cause fatigue, low energy, difficulty concentrating, mood swings, and poor performance at work or school. There are three different ways to classify insomnia: Difficulty falling asleep. Difficulty staying asleep. Waking up too early in the morning. Any type of insomnia can be long-term (chronic) or short-term (acute). Both are common. Short-term insomnia usually lasts for 3 months or less. Chronic insomnia occurs at least three times a week for longer than 3 months. What are the causes? Insomnia may be caused by another condition, situation, or substance, such as: Having certain mental health conditions, such as anxiety and depression. Using caffeine, alcohol, tobacco, or drugs. Having gastrointestinal conditions, such as gastroesophageal reflux disease (GERD). Having certain medical conditions. These include: Asthma. Alzheimer's disease. Stroke. Chronic pain. An overactive thyroid gland (hyperthyroidism). Other sleep disorders, such as restless legs syndrome and sleep apnea. Menopause. Sometimes, the cause of insomnia may not be known. What increases the risk? Risk factors for insomnia include: Gender. Females are affected more often than males. Age. Insomnia is more common as people get older. Stress and certain medical and mental health conditions. Lack of exercise. Having an irregular work schedule. This may include working night shifts and traveling between different time zones. What are the signs or symptoms? If you have insomnia, the main symptom is having trouble falling asleep or having trouble staying asleep. This may lead to other symptoms, such as: Feeling tired or having low energy. Feeling nervous about going to sleep. Not feeling rested in the morning. Having trouble concentrating. Feeling irritable, anxious, or depressed. How is this diagnosed? This condition  may be diagnosed based on: Your symptoms and medical history. Your health care provider may ask about: Your sleep habits. Any medical conditions you have. Your mental health. A physical exam. How is this treated? Treatment for insomnia depends on the cause. Treatment may focus on treating an underlying condition that is causing the insomnia. Treatment may also include: Medicines to help you sleep. Counseling or therapy. Lifestyle adjustments to help you sleep better. Follow these instructions at home: Eating and drinking  Limit or avoid alcohol, caffeinated beverages, and products that contain nicotine and tobacco, especially close to bedtime. These can disrupt your sleep. Do not eat a large meal or eat spicy foods right before bedtime. This can lead to digestive discomfort that can make it hard for you to sleep. Sleep habits  Keep a sleep diary to help you and your health care provider figure out what could be causing your insomnia. Write down: When you sleep. When you wake up during the night. How well you sleep and how rested you feel the next day. Any side effects of medicines you are taking. What you eat and drink. Make your bedroom a dark, comfortable place where it is easy to fall asleep. Put up shades or blackout curtains to block light from outside. Use a white noise machine to block noise. Keep the temperature cool. Limit screen use before bedtime. This includes: Not watching TV. Not using your smartphone, tablet, or computer. Stick to a routine that includes going to bed and waking up at the same times every day and night. This can help you fall asleep faster. Consider making a quiet activity, such as reading, part of your nighttime routine. Try to avoid taking naps during the day so that you sleep better at night. Get out of bed if you are still awake after   15 minutes of trying to sleep. Keep the lights down, but try reading or doing a quiet activity. When you feel  sleepy, go back to bed. General instructions Take over-the-counter and prescription medicines only as told by your health care provider. Exercise regularly as told by your health care provider. However, avoid exercising in the hours right before bedtime. Use relaxation techniques to manage stress. Ask your health care provider to suggest some techniques that may work well for you. These may include: Breathing exercises. Routines to release muscle tension. Visualizing peaceful scenes. Make sure that you drive carefully. Do not drive if you feel very sleepy. Keep all follow-up visits. This is important. Contact a health care provider if: You are tired throughout the day. You have trouble in your daily routine due to sleepiness. You continue to have sleep problems, or your sleep problems get worse. Get help right away if: You have thoughts about hurting yourself or someone else. Get help right away if you feel like you may hurt yourself or others, or have thoughts about taking your own life. Go to your nearest emergency room or: Call 911. Call the National Suicide Prevention Lifeline at 1-800-273-8255 or 988. This is open 24 hours a day. Text the Crisis Text Line at 741741. Summary Insomnia is a sleep disorder that makes it difficult to fall asleep or stay asleep. Insomnia can be long-term (chronic) or short-term (acute). Treatment for insomnia depends on the cause. Treatment may focus on treating an underlying condition that is causing the insomnia. Keep a sleep diary to help you and your health care provider figure out what could be causing your insomnia. This information is not intended to replace advice given to you by your health care provider. Make sure you discuss any questions you have with your health care provider. Document Revised: 10/02/2021 Document Reviewed: 10/02/2021 Elsevier Patient Education  2023 [employer]  

## 2022-04-20 ENCOUNTER — Encounter: Payer: Self-pay | Admitting: Internal Medicine

## 2022-04-20 ENCOUNTER — Ambulatory Visit (INDEPENDENT_AMBULATORY_CARE_PROVIDER_SITE_OTHER): Payer: 59 | Admitting: Internal Medicine

## 2022-04-20 VITALS — BP 122/76 | HR 58 | Temp 97.3°F | Ht 69.0 in | Wt 125.0 lb

## 2022-04-20 DIAGNOSIS — F5104 Psychophysiologic insomnia: Secondary | ICD-10-CM

## 2022-04-20 DIAGNOSIS — R7303 Prediabetes: Secondary | ICD-10-CM

## 2022-04-20 DIAGNOSIS — E782 Mixed hyperlipidemia: Secondary | ICD-10-CM

## 2022-04-20 DIAGNOSIS — G43C1 Periodic headache syndromes in child or adult, intractable: Secondary | ICD-10-CM | POA: Diagnosis not present

## 2022-04-20 DIAGNOSIS — F411 Generalized anxiety disorder: Secondary | ICD-10-CM | POA: Diagnosis not present

## 2022-04-20 DIAGNOSIS — Z8673 Personal history of transient ischemic attack (TIA), and cerebral infarction without residual deficits: Secondary | ICD-10-CM

## 2022-04-20 MED ORDER — TRAZODONE HCL 50 MG PO TABS: 75.0000 mg | ORAL_TABLET | Freq: Every evening | ORAL | 0 refills | Status: DC | PRN

## 2022-04-20 NOTE — Assessment & Plan Note (Signed)
We will check lipid profile with annual exam Continue Rosuvastatin

## 2022-04-20 NOTE — Assessment & Plan Note (Signed)
We will check A1c at annual exam 

## 2022-04-20 NOTE — Patient Instructions (Signed)
Heart-Healthy Eating Plan Many factors influence your heart (coronary) health, including eating and exercise habits. Coronary risk increases with abnormal blood fat (lipid) levels. Heart-healthy meal planning includes limiting unhealthy fats, increasing healthy fats, and making other diet and lifestyle changes. What is my plan? Your health care provider may recommend that you: Limit your fat intake to _________% or less of your total calories each day. Limit your saturated fat intake to _________% or less of your total calories each day. Limit the amount of cholesterol in your diet to less than _________ mg per day. What are tips for following this plan? Cooking Cook foods using methods other than frying. Baking, boiling, grilling, and broiling are all good options. Other ways to reduce fat include: Removing the skin from poultry. Removing all visible fats from meats. Steaming vegetables in water or broth. Meal planning  At meals, imagine dividing your plate into fourths: Fill one-half of your plate with vegetables and green salads. Fill one-fourth of your plate with whole grains. Fill one-fourth of your plate with lean protein foods. Eat 4-5 servings of vegetables per day. One serving equals 1 cup raw or cooked vegetable, or 2 cups raw leafy greens. Eat 4-5 servings of fruit per day. One serving equals 1 medium whole fruit,  cup dried fruit,  cup fresh, frozen, or canned fruit, or  cup 100% fruit juice. Eat more foods that contain soluble fiber. Examples include apples, broccoli, carrots, beans, peas, and barley. Aim to get 25-30 g of fiber per day. Increase your consumption of legumes, nuts, and seeds to 4-5 servings per week. One serving of dried beans or legumes equals  cup cooked, 1 serving of nuts is  cup, and 1 serving of seeds equals 1 tablespoon. Fats Choose healthy fats more often. Choose monounsaturated and polyunsaturated fats, such as olive and canola oils, flaxseeds,  walnuts, almonds, and seeds. Eat more omega-3 fats. Choose salmon, mackerel, sardines, tuna, flaxseed oil, and ground flaxseeds. Aim to eat fish at least 2 times each week. Check food labels carefully to identify foods with trans fats or high amounts of saturated fat. Limit saturated fats. These are found in animal products, such as meats, butter, and cream. Plant sources of saturated fats include palm oil, palm kernel oil, and coconut oil. Avoid foods with partially hydrogenated oils in them. These contain trans fats. Examples are stick margarine, some tub margarines, cookies, crackers, and other baked goods. Avoid fried foods. General information Eat more home-cooked food and less restaurant, buffet, and fast food. Limit or avoid alcohol. Limit foods that are high in starch and sugar. Lose weight if you are overweight. Losing just 5-10% of your body weight can help your overall health and prevent diseases such as diabetes and heart disease. Monitor your salt (sodium) intake, especially if you have high blood pressure. Talk with your health care provider about your sodium intake. Try to incorporate more vegetarian meals weekly. What foods can I eat? Fruits All fresh, canned (in natural juice), or frozen fruits. Vegetables Fresh or frozen vegetables (raw, steamed, roasted, or grilled). Green salads. Grains Most grains. Choose whole wheat and whole grains most of the time. Rice and pasta, including brown rice and pastas made with whole wheat. Meats and other proteins Lean, well-trimmed beef, veal, pork, and lamb. Chicken and turkey without skin. All fish and shellfish. Wild duck, rabbit, pheasant, and venison. Egg whites or low-cholesterol egg substitutes. Dried beans, peas, lentils, and tofu. Seeds and most nuts. Dairy Low-fat or nonfat cheeses,   including ricotta and mozzarella. Skim or 1% milk (liquid, powdered, or evaporated). Buttermilk made with low-fat milk. Nonfat or low-fat  yogurt. Fats and oils Non-hydrogenated (trans-free) margarines. Vegetable oils, including soybean, sesame, sunflower, olive, peanut, safflower, corn, canola, and cottonseed. Salad dressings or mayonnaise made with a vegetable oil. Beverages Water (mineral or sparkling). Coffee and tea. Diet carbonated beverages. Sweets and desserts Sherbet, gelatin, and fruit ice. Small amounts of dark chocolate. Limit all sweets and desserts. Seasonings and condiments All seasonings and condiments. The items listed above may not be a complete list of foods and beverages you can eat. Contact a dietitian for more options. What foods are not recommended? Fruits Canned fruit in heavy syrup. Fruit in cream or butter sauce. Fried fruit. Limit coconut. Vegetables Vegetables cooked in cheese, cream, or butter sauce. Fried vegetables. Grains Breads made with saturated or trans fats, oils, or whole milk. Croissants. Sweet rolls. Donuts. High-fat crackers, such as cheese crackers. Meats and other proteins Fatty meats, such as hot dogs, ribs, sausage, bacon, rib-eye roast or steak. High-fat deli meats, such as salami and bologna. Caviar. Domestic duck and goose. Organ meats, such as liver. Dairy Cream, sour cream, cream cheese, and creamed cottage cheese. Whole-milk cheeses. Whole or 2% milk (liquid, evaporated, or condensed). Whole buttermilk. Cream sauce or high-fat cheese sauce. Whole-milk yogurt. Fats and oils Meat fat, or shortening. Cocoa butter, hydrogenated oils, palm oil, coconut oil, palm kernel oil. Solid fats and shortenings, including bacon fat, salt pork, lard, and butter. Nondairy cream substitutes. Salad dressings with cheese or sour cream. Beverages Regular sodas and any drinks with added sugar. Sweets and desserts Frosting. Pudding. Cookies. Cakes. Pies. Milk chocolate or white chocolate. Buttered syrups. Full-fat ice cream or ice cream drinks. The items listed above may not be a complete list of  foods and beverages to avoid. Contact a dietitian for more information. Summary Heart-healthy meal planning includes limiting unhealthy fats, increasing healthy fats, and making other diet and lifestyle changes. Lose weight if you are overweight. Losing just 5-10% of your body weight can help your overall health and prevent diseases such as diabetes and heart disease. Focus on eating a balance of foods, including fruits and vegetables, low-fat or nonfat dairy, lean protein, nuts and legumes, whole grains, and heart-healthy oils and fats. This information is not intended to replace advice given to you by your health care provider. Make sure you discuss any questions you have with your health care provider. Document Revised: 03/02/2021 Document Reviewed: 03/02/2021 Elsevier Patient Education  2022 Elsevier Inc.  

## 2022-04-20 NOTE — Assessment & Plan Note (Signed)
Increase trazodone to 75 to 100 mg nightly.

## 2022-04-20 NOTE — Assessment & Plan Note (Addendum)
She declines SSRI therapy at this time Continue Xanax as needed Support offered

## 2022-04-20 NOTE — Assessment & Plan Note (Signed)
Continue Imitrex as needed

## 2022-04-20 NOTE — Progress Notes (Signed)
Subjective:    Patient ID: Erika Hurley, female    DOB: 07/27/1966, 56 y.o.   MRN: [id number]  HPI  Patient presents to clinic today for follow-up of chronic conditions.  Migraines: These occur once a week.  Triggered by hormones and seasonal changes.  She takes Imitrex as needed with good relief of symptoms.  She follows with neurology.  HLD with History of Stroke: Her last LDL was 111, triglycerides 90, 09/2021.  She denies myalgias on Rosuvastatin.  She tries to consume a low-fat diet.  She follows with [place] [facility].  Insomnia: She has difficulty falling and staying asleep.  She takes Trazodone as prescribed with minimal relief of symptoms.  There is no sleep study on file.  Anxiety: Persistent.  She takes Xanax as needed with some relief of symptoms.  She is not currently seeing a therapist.  She denies depression, SI/HI.  Prediabetes: Her last A1c was 5.4%, 08/2021.  She is not taking any oral diabetic medication at this time.  She does not check her sugars.  Review of Systems     Past Medical History:  Diagnosis Date   Arthritis    Cervical dysplasia    History of abnormal cervical Pap smear    HGSIL   Migraines    Solitary cyst of breast    Stroke [facility])     Current Outpatient Medications  Medication Sig Dispense Refill   ALPRAZolam (XANAX) 0.25 MG tablet Take 1 tablet (0.25 mg total) by mouth 2 (two) times daily as needed for anxiety. 20 tablet 0   Melatonin 3 MG TABS Take 30 mg by mouth.     rosuvastatin (CRESTOR) 5 MG tablet Take 1 tablet (5 mg total) by mouth daily. 90 tablet 3   SUMAtriptan (IMITREX) 50 MG tablet TAKE ONE TABLET BY MOUTH EVERY TWO HOURS AS NEEDED FOR MIGRAINE OR HEADACHE. MAY REPEAT IN TWO HOURS IF HEADACHE PERSISTS OR RECURS 10 tablet 5   traZODone (DESYREL) 50 MG tablet Take 0.5-1 tablets (25-50 mg total) by mouth at bedtime as needed for sleep. 30 tablet 3   No current facility-administered medications for this visit.    Allergies   Allergen Reactions   Sulfa Antibiotics Anaphylaxis    Family History  Problem Relation Age of Onset   Cancer Mother    Hypertension Mother    Hyperlipidemia Mother    Kidney disease Mother    Breast cancer Mother 21       twice   Stroke Father    Heart disease Father    Hypertension Father    Hyperlipidemia Father    Kidney disease Father    Dementia Father    Cancer Maternal Grandfather     Social History   Socioeconomic History   Marital status: Soil scientist    Spouse name: Not on file   Number of children: Not on file   Years of education: Not on file   Highest education level: Not on file  Occupational History   Not on file  Tobacco Use   Smoking status: Never   Smokeless tobacco: Never  Vaping Use   Vaping Use: Never used  Substance and Sexual Activity   Alcohol use: Yes    Alcohol/week: 2.0 standard drinks of alcohol    Types: 2 Glasses of wine per week   Drug use: No   Sexual activity: Yes    Partners: Male    Birth control/protection: Post-menopausal  Other Topics Concern   Not on file  Social History Narrative   Divorced    Employed Engineer, manufacturing systems    Social Determinants of Health   Financial Resource Strain: Not on file  Food Insecurity: Not on file  Transportation Needs: Not on file  Physical Activity: Not on file  Stress: Not on file  Social Connections: Not on file  Intimate Partner Violence: Not on file     Constitutional: Patient reports intermittent headaches.  Denies fever, malaise, fatigue, or abrupt weight changes.  Respiratory: Denies difficulty breathing, shortness of breath, cough or sputum production.   Cardiovascular: Denies chest pain, chest tightness, palpitations or swelling in the hands or feet.  Gastrointestinal: Denies abdominal pain, bloating, constipation, diarrhea or blood in the stool.  GU: Pt report decreased libido. Denies urgency, frequency, pain with urination, burning sensation, blood in urine, odor or  discharge. Musculoskeletal: Denies decrease in range of motion, difficulty with gait, muscle pain or joint pain and swelling.  Skin: Denies redness, rashes, lesions or ulcercations.  Neurological: Patient reports insomnia.  Denies dizziness, difficulty with memory, difficulty with speech or problems with balance and coordination.  Psych: Patient has a history of anxiety.  Denies depression, SI/HI.  No other specific complaints in a complete review of systems (except as listed in HPI above).  Objective:   Physical Exam  BP 122/76 (BP Location: Left Arm, Patient Position: Sitting, Cuff Size: Normal)   Pulse (!) 58   Temp (!) 97.3 F (36.3 C) (Temporal)   Ht '5\' 9"'$  (1.753 m)   Wt 125 lb (56.7 kg)   SpO2 95%   BMI 18.46 kg/m   Wt Readings from Last 3 Encounters:  11/02/21 121 lb (54.9 kg)  10/09/21 119 lb (54 kg)  09/14/21 120 lb 9.6 oz (54.7 kg)    General: Appears her stated age, well developed, well nourished in NAD. Skin: Warm, dry and intact.  HEENT: Head: normal shape and size; Eyes: sclera white, no icterus, conjunctiva pink, PERRLA and EOMs intact;  Cardiovascular: Normal rate and rhythm. S1,S2 noted.  No murmur, rubs or gallops noted.  Pulmonary/Chest: Normal effort and positive vesicular breath sounds. No respiratory distress. No wheezes, rales or ronchi noted.  Musculoskeletal: No difficulty with gait.  Neurological: Alert and oriented.  Psychiatric: Mood and affect normal. Mildly anxious appearing. Judgment and thought content normal.    BMET    Component Value Date/Time   NA 143 05/17/2021 0848   K 4.8 05/17/2021 0848   CL 106 05/17/2021 0848   CO2 29 05/17/2021 0848   GLUCOSE 91 05/17/2021 0848   BUN 16 05/17/2021 0848   CREATININE 0.96 05/17/2021 0848   CALCIUM 9.6 05/17/2021 0848   GFRNONAA >60 02/03/2021 1836   GFRNONAA 71 09/12/2020 1005   GFRAA 83 09/12/2020 1005    Lipid Panel     Component Value Date/Time   CHOL 185 08/23/2021 0803   TRIG 90  08/23/2021 0803   HDL 55 08/23/2021 0803   CHOLHDL 3.4 08/23/2021 0803   VLDL 16.6 02/23/2016 0905   LDLCALC 111 (H) 08/23/2021 0803    CBC    Component Value Date/Time   WBC 6.3 05/17/2021 0848   RBC 4.34 05/17/2021 0848   HGB 13.9 05/17/2021 0848   HCT 41.1 05/17/2021 0848   PLT 264 05/17/2021 0848   MCV 94.7 05/17/2021 0848   MCH 32.0 05/17/2021 0848   MCHC 33.8 05/17/2021 0848   RDW 11.8 05/17/2021 0848   LYMPHSABS 1,926 09/12/2020 1005   MONOABS 0.4 09/15/2015 1028  EOSABS 29 09/12/2020 1005   BASOSABS 29 09/12/2020 1005    Hgb A1C Lab Results  Component Value Date   HGBA1C 5.4 08/23/2021            Assessment & Plan:     Webb Silversmith, NP

## 2022-04-20 NOTE — Assessment & Plan Note (Signed)
No residual effect  Continue Rosuvastatin We will check lipid profile with annual exam

## 2022-04-26 ENCOUNTER — Encounter: Payer: Self-pay | Admitting: Internal Medicine

## 2022-04-30 ENCOUNTER — Other Ambulatory Visit: Payer: Self-pay | Admitting: Family Medicine

## 2022-04-30 DIAGNOSIS — F5104 Psychophysiologic insomnia: Secondary | ICD-10-CM

## 2022-04-30 DIAGNOSIS — F411 Generalized anxiety disorder: Secondary | ICD-10-CM

## 2022-04-30 DIAGNOSIS — M79601 Pain in right arm: Secondary | ICD-10-CM

## 2022-05-22 ENCOUNTER — Encounter: Payer: Self-pay | Admitting: Internal Medicine

## 2022-05-22 ENCOUNTER — Ambulatory Visit (INDEPENDENT_AMBULATORY_CARE_PROVIDER_SITE_OTHER): Payer: 59 | Admitting: Internal Medicine

## 2022-05-22 VITALS — BP 108/62 | HR 57 | Temp 96.9°F | Wt 127.0 lb

## 2022-05-22 DIAGNOSIS — L2082 Flexural eczema: Secondary | ICD-10-CM

## 2022-05-22 DIAGNOSIS — F411 Generalized anxiety disorder: Secondary | ICD-10-CM | POA: Diagnosis not present

## 2022-05-22 MED ORDER — TRIAMCINOLONE ACETONIDE 0.1 % EX CREA: 1.0000 | TOPICAL_CREAM | Freq: Two times a day (BID) | CUTANEOUS | 0 refills | Status: DC

## 2022-05-22 MED ORDER — BUSPIRONE HCL 5 MG PO TABS: 5.0000 mg | ORAL_TABLET | Freq: Three times a day (TID) | ORAL | 0 refills | Status: DC

## 2022-05-22 NOTE — Patient Instructions (Signed)

## 2022-05-22 NOTE — Assessment & Plan Note (Signed)
Will restart Buspar Support offered

## 2022-05-22 NOTE — Progress Notes (Signed)
Subjective:    Patient ID: Erika Hurley, female    DOB: 06/08/1966, 56 y.o.   MRN: [id number]  HPI  Patient presents to clinic today with complaint of a rash.  This started 2 weeks ago.  She reports the rash is located on her arms.  She reports the rash is very itchy. The rash has not spread. She has tried applying lotion with minimal relief of symptoms. She denies changes in soaps, lotions or detergents.  She would also like to get restarted on Buspar. She has taking this for anxiety in the past. She does have some depression. She is not currently seeing a therapist. She denies SI/HI.  Review of Systems     Past Medical History:  Diagnosis Date   Arthritis    Cervical dysplasia    History of abnormal cervical Pap smear    HGSIL   Migraines    Solitary cyst of breast    Stroke [facility])     Current Outpatient Medications  Medication Sig Dispense Refill   ALPRAZolam (XANAX) 0.25 MG tablet Take 1 tablet (0.25 mg total) by mouth 2 (two) times daily as needed for anxiety. 20 tablet 0   Melatonin 3 MG TABS Take 30 mg by mouth.     rosuvastatin (CRESTOR) 5 MG tablet Take 1 tablet (5 mg total) by mouth daily. 90 tablet 3   SUMAtriptan (IMITREX) 50 MG tablet TAKE ONE TABLET BY MOUTH EVERY TWO HOURS AS NEEDED FOR MIGRAINE OR HEADACHE. MAY REPEAT IN TWO HOURS IF HEADACHE PERSISTS OR RECURS 10 tablet 5   traZODone (DESYREL) 50 MG tablet Take 1.5-2 tablets (75-100 mg total) by mouth at bedtime as needed for sleep. 180 tablet 0   No current facility-administered medications for this visit.    Allergies  Allergen Reactions   Sulfa Antibiotics Anaphylaxis    Family History  Problem Relation Age of Onset   Cancer Mother    Hypertension Mother    Hyperlipidemia Mother    Kidney disease Mother    Breast cancer Mother 42       twice   Stroke Father    Heart disease Father    Hypertension Father    Hyperlipidemia Father    Kidney disease Father    Dementia Father    Cancer  Maternal Grandfather     Social History   Socioeconomic History   Marital status: Soil scientist    Spouse name: Not on file   Number of children: Not on file   Years of education: Not on file   Highest education level: Not on file  Occupational History   Not on file  Tobacco Use   Smoking status: Never   Smokeless tobacco: Never  Vaping Use   Vaping Use: Never used  Substance and Sexual Activity   Alcohol use: Yes    Alcohol/week: 2.0 standard drinks of alcohol    Types: 2 Glasses of wine per week   Drug use: No   Sexual activity: Yes    Partners: Male    Birth control/protection: Post-menopausal  Other Topics Concern   Not on file  Social History Narrative   Divorced    Employed Engineer, manufacturing systems    Social Determinants of Health   Financial Resource Strain: Not on file  Food Insecurity: Not on file  Transportation Needs: Not on file  Physical Activity: Not on file  Stress: Not on file  Social Connections: Not on file  Intimate Partner Violence: Not on file  Constitutional: Denies fever, malaise, fatigue, headache or abrupt weight changes.  Respiratory: Denies difficulty breathing, shortness of breath, cough or sputum production.   Cardiovascular: Denies chest pain, chest tightness, palpitations or swelling in the hands or feet.  Skin: Patient reports rash.  Denies redness, lesions or ulcercations.  Psych: Pt reports anxiety and depression. She denies SI/HI.   No other specific complaints in a complete review of systems (except as listed in HPI above).  Objective:   Physical Exam   BP 108/62 (BP Location: Left Arm, Patient Position: Sitting, Cuff Size: Normal)   Pulse (!) 57   Temp (!) 96.9 F (36.1 C) (Temporal)   Wt 127 lb (57.6 kg)   SpO2 98%   BMI 18.75 kg/m   Wt Readings from Last 3 Encounters:  04/20/22 125 lb (56.7 kg)  11/02/21 121 lb (54.9 kg)  10/09/21 119 lb (54 kg)    General: Appears her stated age, well developed, well  nourished in NAD. Skin: Grouped maculopapular rash noted of bilateral antecubital fossa. Cardiovascular: Bradycardic with normal rhythm. S1,S2 noted.  No murmur, rubs or gallops noted.  Pulmonary/Chest: Normal effort and positive vesicular breath sounds. No respiratory distress. No wheezes, rales or ronchi noted.  Neurological: Alert and oriented. Psych: Mildly anxious appearing. Judgement and thought content normal.  BMET    Component Value Date/Time   NA 143 05/17/2021 0848   K 4.8 05/17/2021 0848   CL 106 05/17/2021 0848   CO2 29 05/17/2021 0848   GLUCOSE 91 05/17/2021 0848   BUN 16 05/17/2021 0848   CREATININE 0.96 05/17/2021 0848   CALCIUM 9.6 05/17/2021 0848   GFRNONAA >60 02/03/2021 1836   GFRNONAA 71 09/12/2020 1005   GFRAA 83 09/12/2020 1005    Lipid Panel     Component Value Date/Time   CHOL 185 08/23/2021 0803   TRIG 90 08/23/2021 0803   HDL 55 08/23/2021 0803   CHOLHDL 3.4 08/23/2021 0803   VLDL 16.6 02/23/2016 0905   LDLCALC 111 (H) 08/23/2021 0803    CBC    Component Value Date/Time   WBC 6.3 05/17/2021 0848   RBC 4.34 05/17/2021 0848   HGB 13.9 05/17/2021 0848   HCT 41.1 05/17/2021 0848   PLT 264 05/17/2021 0848   MCV 94.7 05/17/2021 0848   MCH 32.0 05/17/2021 0848   MCHC 33.8 05/17/2021 0848   RDW 11.8 05/17/2021 0848   LYMPHSABS 1,926 09/12/2020 1005   MONOABS 0.4 09/15/2015 1028   EOSABS 29 09/12/2020 1005   BASOSABS 29 09/12/2020 1005    Hgb A1C Lab Results  Component Value Date   HGBA1C 5.4 08/23/2021           Assessment & Plan:   Eczema:  RX for Triamcinolone cream 0.1% BID prn Avoid excessively hot water during baths Apply lotion such as Sarna as needed   RTC in 3 months for your annual exam Webb Silversmith, NP

## 2022-05-28 ENCOUNTER — Ambulatory Visit: Payer: 59 | Admitting: Internal Medicine

## 2022-06-11 ENCOUNTER — Other Ambulatory Visit: Payer: Self-pay | Admitting: Internal Medicine

## 2022-06-11 ENCOUNTER — Encounter: Payer: Self-pay | Admitting: Internal Medicine

## 2022-06-12 NOTE — Telephone Encounter (Signed)
Requested Prescriptions  Pending Prescriptions Disp Refills  . busPIRone (BUSPAR) 5 MG tablet [Pharmacy Med Name: busPIRone HCl 5 MG Oral Tablet] 90 tablet 2    Sig: TAKE 1 TABLET BY MOUTH THREE TIMES DAILY     Psychiatry: Anxiolytics/Hypnotics - Non-controlled Passed - 06/11/2022  9:17 AM      Passed - Valid encounter within last 12 months    Recent Outpatient Visits          3 weeks ago Flexural eczema   [facility] [place], Coralie Keens, NP   1 month ago Mixed hyperlipidemia   [facility] [place], Coralie Keens, NP   3 months ago GAD (generalized anxiety disorder)   [facility], Coralie Keens, NP   9 months ago Encounter for general adult medical examination with abnormal findings   [facility] [place], Coralie Keens, NP   1 year ago History of CVA (cerebrovascular accident)   [facility], Coralie Keens, NP      Future Appointments            In 4 months [facility], Coralie Keens, NP [facility], [facility]

## 2022-07-18 ENCOUNTER — Other Ambulatory Visit: Payer: Self-pay | Admitting: Internal Medicine

## 2022-07-18 DIAGNOSIS — G43C1 Periodic headache syndromes in child or adult, intractable: Secondary | ICD-10-CM

## 2022-07-19 NOTE — Telephone Encounter (Signed)
Requested Prescriptions  Pending Prescriptions Disp Refills  . SUMAtriptan (IMITREX) 50 MG tablet [Pharmacy Med Name: SUMAtriptan Succinate 50 MG Oral Tablet] 10 tablet 1    Sig: TAKE 1 TABLET BY MOUTH AT ONSET OF MIGRAINE HEADACHE.  MAY REPEAT IN 2 HOURS IF HEADACHE PERSISTS OR RECURS     Neurology:  Migraine Therapy - Triptan Passed - 07/18/2022  7:56 AM      Passed - Last BP in normal range    BP Readings from Last 1 Encounters:  05/22/22 108/62         Passed - Valid encounter within last 12 months    Recent Outpatient Visits          1 month ago Flexural eczema   [facility] [place], Coralie Keens, NP   3 months ago Mixed hyperlipidemia   [place], Coralie Keens, NP   4 months ago GAD (generalized anxiety disorder)   [facility], Coralie Keens, NP   10 months ago Encounter for general adult medical examination with abnormal findings   [facility] [place], Coralie Keens, NP   1 year ago History of CVA (cerebrovascular accident)   [facility], Coralie Keens, NP      Future Appointments            In 3 months Baity, Coralie Keens, NP [facility], [facility]

## 2022-07-24 ENCOUNTER — Encounter: Payer: Self-pay | Admitting: Internal Medicine

## 2022-09-25 ENCOUNTER — Ambulatory Visit (INDEPENDENT_AMBULATORY_CARE_PROVIDER_SITE_OTHER): Payer: 59 | Admitting: Dermatology

## 2022-09-25 DIAGNOSIS — L814 Other melanin hyperpigmentation: Secondary | ICD-10-CM

## 2022-09-25 DIAGNOSIS — D229 Melanocytic nevi, unspecified: Secondary | ICD-10-CM

## 2022-09-25 DIAGNOSIS — L309 Dermatitis, unspecified: Secondary | ICD-10-CM

## 2022-09-25 DIAGNOSIS — L578 Other skin changes due to chronic exposure to nonionizing radiation: Secondary | ICD-10-CM

## 2022-09-25 DIAGNOSIS — L570 Actinic keratosis: Secondary | ICD-10-CM

## 2022-09-25 DIAGNOSIS — D225 Melanocytic nevi of trunk: Secondary | ICD-10-CM | POA: Diagnosis not present

## 2022-09-25 DIAGNOSIS — Z1283 Encounter for screening for malignant neoplasm of skin: Secondary | ICD-10-CM

## 2022-09-25 DIAGNOSIS — L565 Disseminated superficial actinic porokeratosis (DSAP): Secondary | ICD-10-CM

## 2022-09-25 DIAGNOSIS — L821 Other seborrheic keratosis: Secondary | ICD-10-CM

## 2022-09-25 DIAGNOSIS — D2272 Melanocytic nevi of left lower limb, including hip: Secondary | ICD-10-CM

## 2022-09-25 DIAGNOSIS — D2372 Other benign neoplasm of skin of left lower limb, including hip: Secondary | ICD-10-CM

## 2022-09-25 DIAGNOSIS — L918 Other hypertrophic disorders of the skin: Secondary | ICD-10-CM

## 2022-09-25 DIAGNOSIS — D2271 Melanocytic nevi of right lower limb, including hip: Secondary | ICD-10-CM

## 2022-09-25 MED ORDER — TRIAMCINOLONE ACETONIDE 0.1 % EX CREA: TOPICAL_CREAM | CUTANEOUS | 0 refills | Status: DC

## 2022-09-25 NOTE — Patient Instructions (Addendum)
At legs  disseminated superficial actinic porokeratosis  DSAP is a chronic inherited condition of sun-exposed skin, most commonly affecting the arms and legs.  It is difficult to treat.  Recommend photoprotection and regular use of spf 30 or higher sunscreen to prevent worsening of condition and precancerous changes.  Start/continue Cholesterol 2% Lovastatin 2% Cream 240 gm- Apply twice daily as directed to affected areas arms and legs.  Sent to [facility]  Instructions for [facility] Medications  One or more of your medications was sent to the [facility] mail order compounding pharmacy. You will receive an email from them and can purchase the medicine through that link. It will then be mailed to your home at the address you confirmed. If for any reason you do not receive an email from them, please check your spam folder. If you still do not find the email, please let us know. [facility] phone number is [phone].     Actinic keratoses are precancerous spots that appear secondary to cumulative UV radiation exposure/sun exposure over time. They are chronic with expected duration over 1 year. A portion of actinic keratoses will progress to squamous cell carcinoma of the skin. It is not possible to reliably predict which spots will progress to skin cancer and so treatment is recommended to prevent development of skin cancer.  Recommend daily broad spectrum sunscreen SPF 30+ to sun-exposed areas, reapply every 2 hours as needed.  Recommend staying in the shade or wearing long sleeves, sun glasses (UVA+UVB protection) and wide brim hats (4-inch brim around the entire circumference of the hat). Call for new or changing lesions.   Cryotherapy Aftercare  Wash gently with soap and water everyday.   Apply Vaseline and Band-Aid daily until healed.   Melanoma ABCDEs  Melanoma is the most dangerous type of skin cancer, and is the leading cause of death from skin disease.  You  are more likely to develop melanoma if you: Have light-colored skin, light-colored eyes, or red or blond hair Spend a lot of time in the sun Tan regularly, either outdoors or in a tanning bed Have had blistering sunburns, especially during childhood Have a close family member who has had a melanoma Have atypical moles or large birthmarks  Early detection of melanoma is key since treatment is typically straightforward and cure rates are extremely high if we catch it early.   The first sign of melanoma is often a change in a mole or a new dark spot.  The ABCDE system is a way of remembering the signs of melanoma.  A for asymmetry:  The two halves do not match. B for border:  The edges of the growth are irregular. C for color:  A mixture of colors are present instead of an even brown color. D for diameter:  Melanomas are usually (but not always) greater than 8m - the size of a pencil eraser. E for evolution:  The spot keeps changing in size, shape, and color.  Please check your skin once per month between visits. You can use a small mirror in front and a large mirror behind you to keep an eye on the back side or your body.   If you see any new or changing lesions before your next follow-up, please call to schedule a visit.  Please continue daily skin protection including broad spectrum sunscreen SPF 30+ to sun-exposed areas, reapplying every 2 hours as needed when you're outdoors.   Staying in the shade or wearing long sleeves,  sun glasses (UVA+UVB protection) and wide brim hats (4-inch brim around the entire circumference of the hat) are also recommended for sun protection.    Due to recent changes in healthcare laws, you may see results of your pathology and/or laboratory studies on MyChart before the doctors have had a chance to review them. We understand that in some cases there may be results that are confusing or concerning to you. Please understand that not all results are received at  the same time and often the doctors may need to interpret multiple results in order to provide you with the best plan of care or course of treatment. Therefore, we ask that you please give [place] 2 business days to thoroughly review all your results before contacting the office for clarification. Should we see a critical lab result, you will be contacted sooner.   If You Need Anything After Your Visit  If you have any questions or concerns for your doctor, please call our main line at [phone] and press option 4 to reach your doctor's medical assistant. If no one answers, please leave a voicemail as directed and we will return your call as soon as possible. Messages left after 4 pm will be answered the following business day.   You may also send [place] a message via [place]. We typically respond to MyChart messages within 1-2 business days.  For prescription refills, please ask your pharmacy to contact our office. Our fax number is [phone].  If you have an urgent issue when the clinic is closed that cannot wait until the next business day, you can page your doctor at the number below.    Please note that while we do our best to be available for urgent issues outside of office hours, we are not available 24/7.   If you have an urgent issue and are unable to reach [place], you may choose to seek medical care at your doctor's office, retail clinic, urgent care center, or emergency room.  If you have a medical emergency, please immediately call 911 or go to the emergency department.  Pager Numbers  - Dr. Nehemiah Massed: [phone]  - Dr. Laurence Ferrari: [phone]  - Dr. Nicole Kindred: [phone]  In the event of inclement weather, please call our main line at [phone] for an update on the status of any delays or closures.  Dermatology Medication Tips: Please keep the boxes that topical medications come in in order to help keep track of the instructions about where and how to use these. Pharmacies  typically print the medication instructions only on the boxes and not directly on the medication tubes.   If your medication is too expensive, please contact our office at [phone] option 4 or send [place] a message through [place].   We are unable to tell what your co-pay for medications will be in advance as this is different depending on your insurance coverage. However, we may be able to find a substitute medication at lower cost or fill out paperwork to get insurance to cover a needed medication.   If a prior authorization is required to get your medication covered by your insurance company, please allow [place] 1-2 business days to complete this process.  Drug prices often vary depending on where the prescription is filled and some pharmacies may offer cheaper prices.  The website www.goodrx.com contains coupons for medications through different pharmacies. The prices here do not account for what the cost may be with help from insurance (it may be cheaper with your insurance),  but the website can give you the price if you did not use any insurance.  - You can print the associated coupon and take it with your prescription to the pharmacy.  - You may also stop by our office during regular business hours and pick up a GoodRx coupon card.  - If you need your prescription sent electronically to a different pharmacy, notify our office through [facility] or by phone at [phone] option 4.     Si Usted Necesita Algo Despus de Su Visita  Tambin puede enviarnos un mensaje a travs de Pharmacist, community. Por lo general respondemos a los mensajes de MyChart en el transcurso de 1 a 2 das hbiles.  Para renovar recetas, por favor pida a su farmacia que se ponga en contacto con nuestra oficina. Harland Dingwall de fax es [place] [phone].  Si tiene un asunto urgente cuando la clnica est cerrada y que no puede esperar hasta el siguiente da hbil, puede llamar/localizar a su doctor(a) al nmero que  aparece a continuacin.   Por favor, tenga en cuenta que aunque hacemos todo lo posible para estar disponibles para asuntos urgentes fuera del horario de [place], no estamos disponibles las 24 horas del da, los 7 das de la [place].   Si tiene un problema urgente y no puede comunicarse con nosotros, puede optar por buscar atencin mdica  en el consultorio de su doctor(a), en una clnica privada, en un centro de atencin urgente o en una sala de emergencias.  Si tiene Engineering geologist, por favor llame inmediatamente al 911 o vaya a la sala de emergencias.  Nmeros de bper  - Dr. Nehemiah Massed: [phone]  - Dra. Moye: [phone]  - Dra. Nicole Kindred: [phone]  En caso de inclemencias del [place], por favor llame a Johnsie Kindred principal al [phone] para una actualizacin sobre el [place] de cualquier retraso o cierre.  Consejos para la medicacin en dermatologa: Por favor, guarde las cajas en las que vienen los medicamentos de uso tpico para ayudarle a seguir las instrucciones sobre dnde y cmo usarlos. Las farmacias generalmente imprimen las instrucciones del medicamento slo en las cajas y no directamente en los tubos del [place].   Si su medicamento es muy caro, por favor, pngase en contacto con Zigmund Daniel llamando al [phone] y presione la opcin 4 o envenos un mensaje a travs de Pharmacist, community.   No podemos decirle cul ser su copago por los medicamentos por adelantado ya que esto es diferente dependiendo de la cobertura de su seguro. Sin embargo, es posible que podamos encontrar un medicamento sustituto a Electrical engineer un formulario para que el seguro cubra el medicamento que se considera necesario.   Si se requiere una autorizacin previa para que su compaa de seguros [place] su medicamento, por favor permtanos de 1 a 2 das hbiles para completar este proceso.  Los precios de los medicamentos varan con frecuencia dependiendo del Environmental consultant de dnde se surte  la receta y alguna farmacias pueden ofrecer precios ms baratos.  El sitio web www.goodrx.com tiene cupones para medicamentos de Airline pilot. Los precios aqu no tienen en cuenta lo que podra costar con la ayuda del seguro (puede ser ms barato con su seguro), pero el sitio web puede darle el precio si no utiliz Research scientist (physical sciences).  - Puede imprimir el cupn correspondiente y llevarlo con su receta a la farmacia.  - Tambin puede pasar por nuestra oficina durante el horario de atencin regular y Charity fundraiser una tarjeta de cupones  de GoodRx.  - Si necesita que su receta se enve electrnicamente a una farmacia diferente, informe a nuestra oficina a travs de MyChart de [place] o por telfono llamando al [phone] y presione la opcin 4.

## 2022-09-25 NOTE — Progress Notes (Signed)
Follow-Up Visit   Subjective  Erika Hurley is a 56 y.o. female who presents for the following: Annual Exam (1 year tbse, hx of nevus )  The patient presents for Total-Body Skin Exam (TBSE) for skin cancer screening and mole check.  The patient has spots, moles and lesions to be evaluated, some may be new or changing and the patient has concerns that these could be cancer.   The following portions of the chart were reviewed this encounter and updated as appropriate:      Review of Systems: No other skin or systemic complaints except as noted in HPI or Assessment and Plan.   Objective  Well appearing patient in no apparent distress; mood and affect are within normal limits.  A full examination was performed including scalp, head, eyes, ears, nose, lips, neck, chest, axillae, abdomen, back, buttocks, bilateral upper extremities, bilateral lower extremities, hands, feet, fingers, toes, fingernails, and toenails. All findings within normal limits unless otherwise noted below.  b/l elbows and upper back Xerosis mild lichenification at elbows and pink scaly patch at upper back  Left Flank at braline 5 x 3 mm  speckled brown macule   left upper arm x 1, right lower pretibia x 3 (3) Erythematous thin papules/macules with gritty scale.   right and left lower legs, arms Scattered waxy pink/white scaly macules with keratotic rim   Assessment & Plan  Dermatitis b/l elbows and upper back  Recommend mild soap and moisturizing cream 1-2 times daily.  Gentle skin care handout provided.   Continue triamcinolone bid prn for itchy area. Avoid applying to face, groin, and axilla. Use as directed. Long-term use can cause thinning of the skin.  Topical steroids (such as triamcinolone, fluocinolone, fluocinonide, mometasone, clobetasol, halobetasol, betamethasone, hydrocortisone) can cause thinning and lightening of the skin if they are used for too long in the same area. Your physician has  selected the right strength medicine for your problem and area affected on the body. Please use your medication only as directed by your physician to prevent side effects.    triamcinolone cream (KENALOG) 0.1 % - b/l elbows and upper back Apply topically bid prn for itchy areas at elbows for dermatitis. Avoid applying to face, groin, and axilla. Use as directed.  Nevus Left Flank at braline  Benign-appearing. Stable compared to previous visit. Observation.  Call clinic for new or changing moles.  Recommend daily use of broad spectrum spf 30+ sunscreen to sun-exposed areas.    Actinic keratosis (4) left upper arm x 1; right lower pretibia x 3 (3)  Vs ISKs vrs DSAP  Actinic keratoses are precancerous spots that appear secondary to cumulative UV radiation exposure/sun exposure over time. They are chronic with expected duration over 1 year. A portion of actinic keratoses will progress to squamous cell carcinoma of the skin. It is not possible to reliably predict which spots will progress to skin cancer and so treatment is recommended to prevent development of skin cancer.  Recommend daily broad spectrum sunscreen SPF 30+ to sun-exposed areas, reapply every 2 hours as needed.  Recommend staying in the shade or wearing long sleeves, sun glasses (UVA+UVB protection) and wide brim hats (4-inch brim around the entire circumference of the hat). Call for new or changing lesions.  Destruction of lesion - left upper arm x 1, right lower pretibia x 3  Destruction method: cryotherapy   Informed consent: discussed and consent obtained   Lesion destroyed using liquid nitrogen: Yes   Region  frozen until ice ball extended beyond lesion: Yes   Outcome: patient tolerated procedure well with no complications   Post-procedure details: wound care instructions given   Additional details:  Prior to procedure, discussed risks of blister formation, small wound, skin dyspigmentation, or rare scar following  cryotherapy. Recommend Vaseline ointment to treated areas while healing.   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 Condition is symptomatic/ bothersome to patient. Not currently at goal.   DSAP is a chronic inherited condition of sun-exposed skin, most commonly affecting the arms and legs.  It is difficult to treat.  Recommend photoprotection and regular use of spf 30 or higher sunscreen to prevent worsening of condition and precancerous changes.  Start Cholesterol 2% Lovastatin 2% Cream 240 gm- Apply twice daily as directed to affected areas arms and legs.  Sent to [facility]  Lentigines - Scattered tan macules - Due to sun exposure - Benign-appearing, observe - Recommend daily broad spectrum sunscreen SPF 30+ to sun-exposed areas, reapply every 2 hours as needed. - Call for any changes  Seborrheic Keratoses Right flank - Stuck-on, waxy, tan-brown papules and/or plaques  - Benign-appearing - Discussed benign etiology and prognosis. - Observe - Call for any changes  Dermatofibroma Left upper calf - Firm pink/brown papulenodule with dimple sign - Benign appearing - Call for any changes  Acrochordons (Skin Tags) Right flank - Fleshy, skin-colored pedunculated papules - Benign appearing.  - Observe. - If desired, they can be removed with an in office procedure that is not covered by insurance. - Please call the clinic if you notice any new or changing lesions. Xerosis mild lichenification at elbows  Dermatitis   Melanocytic Nevi - Tan-brown and/or pink-flesh-colored symmetric macules and papules including legs - Benign appearing on exam today - Observation - Call clinic for new or changing moles - Recommend daily use of broad spectrum spf 30+ sunscreen to sun-exposed areas.   Hemangiomas - Red papules - Discussed benign nature -  Observe - Call for any changes  Actinic Damage - Chronic condition, secondary to cumulative UV/sun exposure - diffuse scaly erythematous macules with underlying dyspigmentation - Recommend daily broad spectrum sunscreen SPF 30+ to sun-exposed areas, reapply every 2 hours as needed.  - Staying in the shade or wearing long sleeves, sun glasses (UVA+UVB protection) and wide brim hats (4-inch brim around the entire circumference of the hat) are also recommended for sun protection.  - Call for new or changing lesions.  Skin cancer screening performed today. Return in about 1 year (around 09/26/2023) for TBSE. I, Ruthell Rummage, CMA, am acting as scribe for Brendolyn Patty, MD.  Documentation: I have reviewed the above documentation for accuracy and completeness, and I agree with the above.  Brendolyn Patty MD

## 2022-10-12 ENCOUNTER — Encounter: Payer: Self-pay | Admitting: Internal Medicine

## 2022-10-12 ENCOUNTER — Ambulatory Visit (INDEPENDENT_AMBULATORY_CARE_PROVIDER_SITE_OTHER): Payer: 59 | Admitting: Internal Medicine

## 2022-10-12 VITALS — BP 106/62 | HR 60 | Temp 96.6°F | Ht 69.0 in | Wt 126.0 lb

## 2022-10-12 DIAGNOSIS — R7303 Prediabetes: Secondary | ICD-10-CM | POA: Diagnosis not present

## 2022-10-12 DIAGNOSIS — E782 Mixed hyperlipidemia: Secondary | ICD-10-CM | POA: Diagnosis not present

## 2022-10-12 DIAGNOSIS — Z1231 Encounter for screening mammogram for malignant neoplasm of breast: Secondary | ICD-10-CM

## 2022-10-12 DIAGNOSIS — Z0000 Encounter for general adult medical examination without abnormal findings: Secondary | ICD-10-CM

## 2022-10-12 DIAGNOSIS — Z780 Asymptomatic menopausal state: Secondary | ICD-10-CM

## 2022-10-12 MED ORDER — BUSPIRONE HCL 5 MG PO TABS: 5.0000 mg | ORAL_TABLET | Freq: Three times a day (TID) | ORAL | 2 refills | Status: DC

## 2022-10-12 NOTE — Progress Notes (Signed)
Subjective:    Patient ID: Erika Hurley, female    DOB: 1966-03-13, 56 y.o.   MRN: [id number]  HPI  Patient presents the clinic today for her annual exam.  Flu: 08/2019 Tetanus: 05/2017 COVID: Never Shingrix: x 2 Pap smear: 10/2019 Mammogram: 10/2021 Bone density: Never Colon screening: 08/2021 Vision screening: annually Dentist: biannually  Diet: She does eat meat. She consumes fruits and veggies. She tries to avoid fried foods. She drinks mostly water and Mt Dew Exercise: None  Review of Systems   Past Medical History:  Diagnosis Date   Arthritis    Cervical dysplasia    History of abnormal cervical Pap smear    HGSIL   Migraines    Solitary cyst of breast    Stroke [facility]     Current Outpatient Medications  Medication Sig Dispense Refill   ALPRAZolam (XANAX) 0.25 MG tablet Take 1 tablet (0.25 mg total) by mouth 2 (two) times daily as needed for anxiety. 20 tablet 0   busPIRone (BUSPAR) 5 MG tablet TAKE 1 TABLET BY MOUTH THREE TIMES DAILY 90 tablet 2   Melatonin 3 MG TABS Take 30 mg by mouth.     rosuvastatin (CRESTOR) 5 MG tablet Take 1 tablet (5 mg total) by mouth daily. 90 tablet 3   SUMAtriptan (IMITREX) 50 MG tablet TAKE 1 TABLET BY MOUTH AT ONSET OF MIGRAINE HEADACHE.  MAY REPEAT IN 2 HOURS IF HEADACHE PERSISTS OR RECURS 10 tablet 1   traZODone (DESYREL) 50 MG tablet Take 1.5-2 tablets (75-100 mg total) by mouth at bedtime as needed for sleep. 180 tablet 0   triamcinolone cream (KENALOG) 0.1 % Apply topically bid prn for itchy areas at elbows for dermatitis. Avoid applying to face, groin, and axilla. Use as directed. 30 g 0   No current facility-administered medications for this visit.    Allergies  Allergen Reactions   Sulfa Antibiotics Anaphylaxis    Family History  Problem Relation Age of Onset   Cancer Mother    Hypertension Mother    Hyperlipidemia Mother    Kidney disease Mother    Breast cancer Mother 20       twice   Stroke Father     Heart disease Father    Hypertension Father    Hyperlipidemia Father    Kidney disease Father    Dementia Father    Cancer Maternal Grandfather     Social History   Socioeconomic History   Marital status: Soil scientist    Spouse name: Not on file   Number of children: Not on file   Years of education: Not on file   Highest education level: Not on file  Occupational History   Not on file  Tobacco Use   Smoking status: Never   Smokeless tobacco: Never  Vaping Use   Vaping Use: Never used  Substance and Sexual Activity   Alcohol use: Yes    Alcohol/week: 2.0 standard drinks of alcohol    Types: 2 Glasses of wine per week   Drug use: No   Sexual activity: Yes    Partners: Male    Birth control/protection: Post-menopausal  Other Topics Concern   Not on file  Social History Narrative   Divorced    Employed Engineer, manufacturing systems    Social Determinants of Health   Financial Resource Strain: Not on file  Food Insecurity: Not on file  Transportation Needs: Not on file  Physical Activity: Not on file  Stress: Not on  file  Social Connections: Not on file  Intimate Partner Violence: Not on file     Constitutional: Patient reports intermittent headaches.  Denies fever, malaise, fatigue, or abrupt weight changes.  HEENT: Denies eye pain, eye redness, ear pain, ringing in the ears, wax buildup, runny nose, nasal congestion, bloody nose, or sore throat. Respiratory: Denies difficulty breathing, shortness of breath, cough or sputum production.   Cardiovascular: Denies chest pain, chest tightness, palpitations or swelling in the hands or feet.  Gastrointestinal: Denies abdominal pain, bloating, constipation, diarrhea or blood in the stool.  GU: Denies urgency, frequency, pain with urination, burning sensation, blood in urine, odor or discharge. Musculoskeletal: Denies decrease in range of motion, difficulty with gait, muscle pain or joint pain and swelling.  Skin: Denies  redness, rashes, lesions or ulcercations.  Neurological: Patient reports insomnia.  Denies dizziness, difficulty with memory, difficulty with speech or problems with balance and coordination.  Psych: Patient has a history of anxiety.  Denies depression, SI/HI.  No other specific complaints in a complete review of systems (except as listed in HPI above).   Objective:   Physical Exam  BP 106/62 (BP Location: Left Arm, Patient Position: Sitting, Cuff Size: Normal)   Pulse 60   Temp (!) 96.6 F (35.9 C) (Temporal)   Ht _0  (1.753 m)   Wt 126 lb (57.2 kg)   SpO2 100%   BMI 18.61 kg/m   Wt Readings from Last 3 Encounters:  05/22/22 127 lb (57.6 kg)  04/20/22 125 lb (56.7 kg)  11/02/21 121 lb (54.9 kg)    General: Appears her stated age, well developed, well nourished in NAD. Skin: Warm, dry and intact.  HEENT: Head: normal shape and size; Eyes: sclera white, no icterus, conjunctiva pink, PERRLA and EOMs intact;  Neck:  Neck supple, trachea midline. No masses, lumps or thyromegaly present.  Cardiovascular: Normal rate and rhythm. S1,S2 noted.  No murmur, rubs or gallops noted. No JVD or BLE edema. No carotid bruits noted. Pulmonary/Chest: Normal effort and positive vesicular breath sounds. No respiratory distress. No wheezes, rales or ronchi noted.  Abdomen: Normal bowel sounds.  Musculoskeletal: Strength 5/5 BUE/BLE.  No difficulty with gait.  Neurological: Alert and oriented. Cranial nerves II-XII grossly intact. Coordination normal.  Psychiatric: Mood and affect normal. Behavior is normal. Judgment and thought content normal.    BMET    Component Value Date/Time   NA 143 05/17/2021 0848   K 4.8 05/17/2021 0848   CL 106 05/17/2021 0848   CO2 29 05/17/2021 0848   GLUCOSE 91 05/17/2021 0848   BUN 16 05/17/2021 0848   CREATININE 0.96 05/17/2021 0848   CALCIUM 9.6 05/17/2021 0848   GFRNONAA >60 02/03/2021 1836   GFRNONAA 71 09/12/2020 1005   GFRAA 83 09/12/2020 1005     Lipid Panel     Component Value Date/Time   CHOL 185 08/23/2021 0803   TRIG 90 08/23/2021 0803   HDL 55 08/23/2021 0803   CHOLHDL 3.4 08/23/2021 0803   VLDL 16.6 02/23/2016 0905   LDLCALC 111 (H) 08/23/2021 0803    CBC    Component Value Date/Time   WBC 6.3 05/17/2021 0848   RBC 4.34 05/17/2021 0848   HGB 13.9 05/17/2021 0848   HCT 41.1 05/17/2021 0848   PLT 264 05/17/2021 0848   MCV 94.7 05/17/2021 0848   MCH 32.0 05/17/2021 0848   MCHC 33.8 05/17/2021 0848   RDW 11.8 05/17/2021 0848   LYMPHSABS 1,926 09/12/2020 1005   MONOABS  0.4 09/15/2015 1028   EOSABS 29 09/12/2020 1005   BASOSABS 29 09/12/2020 1005    Hgb A1C Lab Results  Component Value Date   HGBA1C 5.4 08/23/2021           Assessment & Plan:   Preventative Health Maintenance:  She declines flu shot today Tetanus UTD Encouraged her to get her COVID-vaccine Discussed Shingrix vaccine, will check coverage with her insurance company and schedule a nurse visit if she would like to do this Pap smear UTD Mammogram and bone density ordered-she will call to schedule Colon screening UTD Encouraged her to consume a balanced diet and exercise regimen Advised her to see an eye doctor and dentist annually We will check CBC, c-Met,  lipid profile and A1c today  RTC in 6 months, follow-up chronic conditions Webb Silversmith, NP

## 2022-10-12 NOTE — Patient Instructions (Signed)
Health Maintenance for Postmenopausal Women Menopause is a normal process in which your ability to get pregnant comes to an end. This process happens slowly over many months or years, usually between the ages of 48 and 55. Menopause is complete when you have missed your menstrual period for 12 months. It is important to talk with your health care provider about some of the most common conditions that affect women after menopause (postmenopausal women). These include heart disease, cancer, and bone loss (osteoporosis). Adopting a healthy lifestyle and getting preventive care can help to promote your health and wellness. The actions you take can also lower your chances of developing some of these common conditions. What are the signs and symptoms of menopause? During menopause, you may have the following symptoms: Hot flashes. These can be moderate or severe. Night sweats. Decrease in sex drive. Mood swings. Headaches. Tiredness (fatigue). Irritability. Memory problems. Problems falling asleep or staying asleep. Talk with your health care provider about treatment options for your symptoms. Do I need hormone replacement therapy? Hormone replacement therapy is effective in treating symptoms that are caused by menopause, such as hot flashes and night sweats. Hormone replacement carries certain risks, especially as you become older. If you are thinking about using estrogen or estrogen with progestin, discuss the benefits and risks with your health care provider. How can I reduce my risk for heart disease and stroke? The risk of heart disease, heart attack, and stroke increases as you age. One of the causes may be a change in the body's hormones during menopause. This can affect how your body uses dietary fats, triglycerides, and cholesterol. Heart attack and stroke are medical emergencies. There are many things that you can do to help prevent heart disease and stroke. Watch your blood pressure High  blood pressure causes heart disease and increases the risk of stroke. This is more likely to develop in people who have high blood pressure readings or are overweight. Have your blood pressure checked: Every 3-5 years if you are 18-39 years of age. Every year if you are 40 years old or older. Eat a healthy diet  Eat a diet that includes plenty of vegetables, fruits, low-fat dairy products, and lean protein. Do not eat a lot of foods that are high in solid fats, added sugars, or sodium. Get regular exercise Get regular exercise. This is one of the most important things you can do for your health. Most adults should: Try to exercise for at least 150 minutes each week. The exercise should increase your heart rate and make you sweat (moderate-intensity exercise). Try to do strengthening exercises at least twice each week. Do these in addition to the moderate-intensity exercise. Spend less time sitting. Even light physical activity can be beneficial. Other tips Work with your health care provider to achieve or maintain a healthy weight. Do not use any products that contain nicotine or tobacco. These products include cigarettes, chewing tobacco, and vaping devices, such as e-cigarettes. If you need help quitting, ask your health care provider. Know your numbers. Ask your health care provider to check your cholesterol and your blood sugar (glucose). Continue to have your blood tested as directed by your health care provider. Do I need screening for cancer? Depending on your health history and family history, you may need to have cancer screenings at different stages of your life. This may include screening for: Breast cancer. Cervical cancer. Lung cancer. Colorectal cancer. What is my risk for osteoporosis? After menopause, you may be   at increased risk for osteoporosis. Osteoporosis is a condition in which bone destruction happens more quickly than new bone creation. To help prevent osteoporosis or  the bone fractures that can happen because of osteoporosis, you may take the following actions: If you are 19-50 years old, get at least 1,000 mg of calcium and at least 600 international units (IU) of vitamin D per day. If you are older than age 50 but younger than age 70, get at least 1,200 mg of calcium and at least 600 international units (IU) of vitamin D per day. If you are older than age 70, get at least 1,200 mg of calcium and at least 800 international units (IU) of vitamin D per day. Smoking and drinking excessive alcohol increase the risk of osteoporosis. Eat foods that are rich in calcium and vitamin D, and do weight-bearing exercises several times each week as directed by your health care provider. How does menopause affect my mental health? Depression may occur at any age, but it is more common as you become older. Common symptoms of depression include: Feeling depressed. Changes in sleep patterns. Changes in appetite or eating patterns. Feeling an overall lack of motivation or enjoyment of activities that you previously enjoyed. Frequent crying spells. Talk with your health care provider if you think that you are experiencing any of these symptoms. General instructions See your health care provider for regular wellness exams and vaccines. This may include: Scheduling regular health, dental, and eye exams. Getting and maintaining your vaccines. These include: Influenza vaccine. Get this vaccine each year before the flu season begins. Pneumonia vaccine. Shingles vaccine. Tetanus, diphtheria, and pertussis (Tdap) booster vaccine. Your health care provider may also recommend other immunizations. Tell your health care provider if you have ever been abused or do not feel safe at home. Summary Menopause is a normal process in which your ability to get pregnant comes to an end. This condition causes hot flashes, night sweats, decreased interest in sex, mood swings, headaches, or lack  of sleep. Treatment for this condition may include hormone replacement therapy. Take actions to keep yourself healthy, including exercising regularly, eating a healthy diet, watching your weight, and checking your blood pressure and blood sugar levels. Get screened for cancer and depression. Make sure that you are up to date with all your vaccines. This information is not intended to replace advice given to you by your health care provider. Make sure you discuss any questions you have with your health care provider. Document Revised: 03/13/2021 Document Reviewed: 03/13/2021 Elsevier Patient Education  2023 Elsevier Inc.  

## 2022-10-13 LAB — COMPLETE METABOLIC PANEL WITH GFR
AG Ratio: 1.9 (calc) (ref 1.0–2.5)
ALT: 13 U/L (ref 6–29)
AST: 19 U/L (ref 10–35)
Albumin: 5 g/dL (ref 3.6–5.1)
Alkaline phosphatase (APISO): 68 U/L (ref 37–153)
BUN: 20 mg/dL (ref 7–25)
CO2: 27 mmol/L (ref 20–32)
Calcium: 10 mg/dL (ref 8.6–10.4)
Chloride: 104 mmol/L (ref 98–110)
Creat: 0.88 mg/dL (ref 0.50–1.03)
Globulin: 2.6 g/dL (calc) (ref 1.9–3.7)
Glucose, Bld: 98 mg/dL (ref 65–99)
Potassium: 4.6 mmol/L (ref 3.5–5.3)
Sodium: 142 mmol/L (ref 135–146)
Total Bilirubin: 0.6 mg/dL (ref 0.2–1.2)
Total Protein: 7.6 g/dL (ref 6.1–8.1)
eGFR: 76 mL/min/{1.73_m2} (ref >=60)

## 2022-10-13 LAB — CBC
HCT: 41 % (ref 35.0–45.0)
Hemoglobin: 13.9 g/dL (ref 11.7–15.5)
MCH: 32 pg (ref 27.0–33.0)
MCHC: 33.9 g/dL (ref 32.0–36.0)
MCV: 94.3 fL (ref 80.0–100.0)
MPV: 10.1 fL (ref 7.5–12.5)
Platelets: 339 10*3/uL (ref 140–400)
RBC: 4.35 10*6/uL (ref 3.80–5.10)
RDW: 11.8 % (ref 11.0–15.0)
WBC: 6.7 10*3/uL (ref 3.8–10.8)

## 2022-10-13 LAB — LIPID PANEL
Cholesterol: 188 mg/dL (ref <200)
HDL: 58 mg/dL (ref >=50)
LDL Cholesterol (Calc): 110 mg/dL (calc) — ABNORMAL HIGH
Non-HDL Cholesterol (Calc): 130 mg/dL (calc) — ABNORMAL HIGH (ref <130)
Total CHOL/HDL Ratio: 3.2 (calc) (ref <5.0)
Triglycerides: 97 mg/dL (ref <150)

## 2022-10-13 LAB — HEMOGLOBIN A1C
Hgb A1c MFr Bld: 5.9 % of total Hgb — ABNORMAL HIGH (ref <5.7)
Mean Plasma Glucose: 123 mg/dL
eAG (mmol/L): 6.8 mmol/L

## 2022-10-15 ENCOUNTER — Encounter: Payer: Self-pay | Admitting: Internal Medicine

## 2022-11-08 ENCOUNTER — Encounter: Payer: 59 | Admitting: Internal Medicine

## 2022-12-17 ENCOUNTER — Ambulatory Visit: Payer: 59 | Admitting: Dermatology

## 2022-12-18 ENCOUNTER — Encounter: Payer: Self-pay | Admitting: Internal Medicine

## 2022-12-18 ENCOUNTER — Ambulatory Visit
Admission: RE | Admit: 2022-12-18 | Discharge: 2022-12-18 | Disposition: A | Discharge status: Home / Self Care | Payer: 59 | Source: Ambulatory Visit | Attending: Internal Medicine | Admitting: Internal Medicine

## 2022-12-18 DIAGNOSIS — Z1231 Encounter for screening mammogram for malignant neoplasm of breast: Secondary | ICD-10-CM | POA: Diagnosis present

## 2022-12-18 DIAGNOSIS — Z780 Asymptomatic menopausal state: Secondary | ICD-10-CM | POA: Diagnosis present

## 2023-01-21 ENCOUNTER — Encounter: Payer: Self-pay | Admitting: Dermatology

## 2023-01-21 ENCOUNTER — Ambulatory Visit (INDEPENDENT_AMBULATORY_CARE_PROVIDER_SITE_OTHER): Payer: 59 | Admitting: Dermatology

## 2023-01-21 VITALS — BP 107/62 | HR 64

## 2023-01-21 DIAGNOSIS — L988 Other specified disorders of the skin and subcutaneous tissue: Secondary | ICD-10-CM

## 2023-01-21 NOTE — Patient Instructions (Addendum)
Due to recent changes in healthcare laws, you may see results of your pathology and/or laboratory studies on MyChart before the doctors have had a chance to review them. We understand that in some cases there may be results that are confusing or concerning to you. Please understand that not all results are received at the same time and often the doctors may need to interpret multiple results in order to provide you with the best plan of care or course of treatment. Therefore, we ask that you please give us 2 business days to thoroughly review all your results before contacting the office for clarification. Should we see a critical lab result, you will be contacted sooner.   If You Need Anything After Your Visit  If you have any questions or concerns for your doctor, please call our main line at [phone] and press option 4 to reach your doctor's medical assistant. If no one answers, please leave a voicemail as directed and we will return your call as soon as possible. Messages left after 4 pm will be answered the following business day.   You may also send us a message via MyChart. We typically respond to MyChart messages within 1-2 business days.  For prescription refills, please ask your pharmacy to contact our office. Our fax number is [phone].  If you have an urgent issue when the clinic is closed that cannot wait until the next business day, you can page your doctor at the number below.    Please note that while we do our best to be available for urgent issues outside of office hours, we are not available 24/7.   If you have an urgent issue and are unable to reach us, you may choose to seek medical care at your doctor's office, retail clinic, urgent care center, or emergency room.  If you have a medical emergency, please immediately call 911 or go to the emergency department.  Pager Numbers  - Dr. Kowalski: [phone]  - Dr. Moye: [phone]  - Dr. Stewart:  [phone]  In the event of inclement weather, please call our main line at [phone] for an update on the status of any delays or closures.  Dermatology Medication Tips: Please keep the boxes that topical medications come in in order to help keep track of the instructions about where and how to use these. Pharmacies typically print the medication instructions only on the boxes and not directly on the medication tubes.   If your medication is too expensive, please contact our office at [phone] option 4 or send us a message through MyChart.   We are unable to tell what your co-pay for medications will be in advance as this is different depending on your insurance coverage. However, we may be able to find a substitute medication at lower cost or fill out paperwork to get insurance to cover a needed medication.   If a prior authorization is required to get your medication covered by your insurance company, please allow us 1-2 business days to complete this process.  Drug prices often vary depending on where the prescription is filled and some pharmacies may offer cheaper prices.  The website www.goodrx.com contains coupons for medications through different pharmacies. The prices here do not account for what the cost may be with help from insurance (it may be cheaper with your insurance), but the website can give you the price if you did not use any insurance.  - You can print the associated coupon and take it with   your prescription to the pharmacy.  - You may also stop by our office during regular business hours and pick up a GoodRx coupon card.  - If you need your prescription sent electronically to a different pharmacy, notify our office through [facility] MyChart or by phone at [phone] option 4.     Si Usted Necesita Algo Despus de Su Visita  Tambin puede enviarnos un mensaje a travs de MyChart. Por lo general respondemos a los mensajes de MyChart en el transcurso de 1 a 2  das hbiles.  Para renovar recetas, por favor pida a su farmacia que se ponga en contacto con nuestra oficina. Nuestro nmero de fax es el [phone].  Si tiene un asunto urgente cuando la clnica est cerrada y que no puede esperar hasta el siguiente da hbil, puede llamar/localizar a su doctor(a) al nmero que aparece a continuacin.   Por favor, tenga en cuenta que aunque hacemos todo lo posible para estar disponibles para asuntos urgentes fuera del horario de oficina, no estamos disponibles las 24 horas del da, los 7 das de la semana.   Si tiene un problema urgente y no puede comunicarse con nosotros, puede optar por buscar atencin mdica  en el consultorio de su doctor(a), en una clnica privada, en un centro de atencin urgente o en una sala de emergencias.  Si tiene una emergencia mdica, por favor llame inmediatamente al 911 o vaya a la sala de emergencias.  Nmeros de bper  - Dr. Kowalski: [phone]  - Dra. Moye: [phone]  - Dra. Stewart: [phone]  En caso de inclemencias del tiempo, por favor llame a nuestra lnea principal al [phone] para una actualizacin sobre el estado de cualquier retraso o cierre.  Consejos para la medicacin en dermatologa: Por favor, guarde las cajas en las que vienen los medicamentos de uso tpico para ayudarle a seguir las instrucciones sobre dnde y cmo usarlos. Las farmacias generalmente imprimen las instrucciones del medicamento slo en las cajas y no directamente en los tubos del medicamento.   Si su medicamento es muy caro, por favor, pngase en contacto con nuestra oficina llamando al [phone] y presione la opcin 4 o envenos un mensaje a travs de MyChart.   No podemos decirle cul ser su copago por los medicamentos por adelantado ya que esto es diferente dependiendo de la cobertura de su seguro. Sin embargo, es posible que podamos encontrar un medicamento sustituto a menor costo o llenar un formulario para que el  seguro cubra el medicamento que se considera necesario.   Si se requiere una autorizacin previa para que su compaa de seguros cubra su medicamento, por favor permtanos de 1 a 2 das hbiles para completar este proceso.  Los precios de los medicamentos varan con frecuencia dependiendo del lugar de dnde se surte la receta y alguna farmacias pueden ofrecer precios ms baratos.  El sitio web www.goodrx.com tiene cupones para medicamentos de diferentes farmacias. Los precios aqu no tienen en cuenta lo que podra costar con la ayuda del seguro (puede ser ms barato con su seguro), pero el sitio web puede darle el precio si no utiliz ningn seguro.  - Puede imprimir el cupn correspondiente y llevarlo con su receta a la farmacia.  - Tambin puede pasar por nuestra oficina durante el horario de atencin regular y recoger una tarjeta de cupones de GoodRx.  - Si necesita que su receta se enve electrnicamente a una farmacia diferente, informe a nuestra oficina a travs de MyChart de    o por telfono llamando al [phone] y presione la opcin 4.  

## 2023-01-21 NOTE — Progress Notes (Signed)
   Follow-Up Visit   Subjective  Erika Hurley is a 57 y.o. female who presents for the following: Facial Elastosis (Patient is concerned with lines and wrinkles at forehead she would like treated.).   The following portions of the chart were reviewed this encounter and updated as appropriate:      Review of Systems: No other skin or systemic complaints except as noted in HPI or Assessment and Plan.   Objective  Well appearing patient in no apparent distress; mood and affect are within normal limits.  A focused examination was performed including forehead, face. Relevant physical exam findings are noted in the Assessment and Plan.  face Rhytides and volume loss.             Assessment & Plan  Elastosis of skin face   Total 25 units of botox injected Glabella - 25 units Forehead - 5 units     Botox Injection - face Location: See attached image  Informed consent: Discussed risks (infection, pain, bleeding, bruising, swelling, allergic reaction, paralysis of nearby muscles, eyelid droop, double vision, neck weakness, difficulty breathing, headache, undesirable cosmetic result, and need for additional treatment) and benefits of the procedure, as well as the alternatives.  Informed consent was obtained.  Preparation: The area was cleansed with alcohol.  Procedure Details:  Botox was injected into the dermis with a 30-gauge needle. Pressure applied to any bleeding. Ice packs offered for swelling.  Lot Number:  [id number] Expiration:  01/2025  Total Units Injected:  25  Plan: Tylenol may be used for headache.  Allow 2 weeks before returning to clinic for additional dosing as needed. Patient will call for any problems.    Return for 4 month follow up.  I, Ruthell Rummage, CMA, am acting as scribe for Brendolyn Patty, MD.  Documentation: I have reviewed the above documentation for accuracy and completeness, and I agree with the above.  Brendolyn Patty MD

## 2023-05-27 ENCOUNTER — Ambulatory Visit (INDEPENDENT_AMBULATORY_CARE_PROVIDER_SITE_OTHER): Payer: 59 | Admitting: Dermatology

## 2023-05-27 ENCOUNTER — Other Ambulatory Visit: Payer: Self-pay

## 2023-05-27 DIAGNOSIS — L988 Other specified disorders of the skin and subcutaneous tissue: Secondary | ICD-10-CM

## 2023-05-27 DIAGNOSIS — L309 Dermatitis, unspecified: Secondary | ICD-10-CM

## 2023-05-27 MED ORDER — TRIAMCINOLONE ACETONIDE 0.1 % EX CREA
TOPICAL_CREAM | CUTANEOUS | 1 refills | Status: DC
Start: 2023-05-27 — End: 2023-10-15

## 2023-05-27 NOTE — Progress Notes (Signed)
Rf request, [facility] 0.1% Cream.

## 2023-05-27 NOTE — Patient Instructions (Signed)

## 2023-05-27 NOTE — Progress Notes (Signed)
   Follow-Up Visit   Subjective  Erika Hurley is a 57 y.o. female who presents for the following: Botox for facial elastosis  The following portions of the chart were reviewed this encounter and updated as appropriate: medications, allergies, medical history  Review of Systems:  No other skin or systemic complaints except as noted in HPI or Assessment and Plan.  Objective  Well appearing patient in no apparent distress; mood and affect are within normal limits.  A focused examination was performed of the face.  Relevant physical exam findings are noted in the Assessment and Plan.  Injection map photo    Assessment & Plan    Facial Elastosis  Location: See attached image  Informed consent: Discussed risks (infection, pain, bleeding, bruising, swelling, allergic reaction, paralysis of nearby muscles, eyelid droop, double vision, neck weakness, difficulty breathing, headache, undesirable cosmetic result, and need for additional treatment) and benefits of the procedure, as well as the alternatives.  Informed consent was obtained.  Preparation: The area was cleansed with alcohol.  Procedure Details:  Botox was injected into the dermis with a 30-gauge needle. Pressure applied to any bleeding. Ice packs offered for swelling.  Lot Number:  [id number] Expiration:  04/2025  Total Units Injected:  25  Plan: Tylenol may be used for headache.  Allow 2 weeks before returning to clinic for additional dosing as needed. Patient will call for any problems.  Return 3-4 months, for Botox.  Erika Hurley, CMA, am acting as scribe for Willeen Niece, MD .   Documentation: I have reviewed the above documentation for accuracy and completeness, and I agree with the above.  Willeen Niece, MD

## 2023-07-11 ENCOUNTER — Encounter: Payer: Self-pay | Admitting: Internal Medicine

## 2023-07-11 MED ORDER — BUSPIRONE HCL 5 MG PO TABS: 5.0000 mg | ORAL_TABLET | Freq: Three times a day (TID) | ORAL | 0 refills | Status: DC

## 2023-08-05 ENCOUNTER — Other Ambulatory Visit: Payer: Self-pay | Admitting: Internal Medicine

## 2023-08-06 NOTE — Telephone Encounter (Signed)
Requested by interface surescripts.  Requested Prescriptions  Pending Prescriptions Disp Refills   busPIRone (BUSPAR) 5 MG tablet [Pharmacy Med Name: busPIRone HCl 5 MG Oral Tablet] 90 tablet 0    Sig: TAKE 1 TABLET BY MOUTH THREE TIMES DAILY . APPOINTMENT REQUIRED FOR FUTURE REFILLS     Psychiatry: Anxiolytics/Hypnotics - Non-controlled Passed - 08/05/2023  9:22 AM      Passed - Valid encounter within last 12 months    Recent Outpatient Visits           9 months ago Encounter for general adult medical examination without abnormal findings   [place] [facility] [place], Salvadore Oxford, NP   1 year ago Flexural eczema   [place] [facility] [place], Salvadore Oxford, NP   1 year ago Mixed hyperlipidemia   [place] [facility] [address], Salvadore Oxford, NP   1 year ago GAD (generalized anxiety disorder)   [place] [facility] [place], Salvadore Oxford, NP   1 year ago Encounter for general adult medical examination with abnormal findings   [place] [facility] [place], Salvadore Oxford, NP

## 2023-09-04 ENCOUNTER — Other Ambulatory Visit: Payer: Self-pay | Admitting: Internal Medicine

## 2023-09-04 NOTE — Telephone Encounter (Signed)
Refused Buspar 5 mg because it's being requested too soon.

## 2023-09-09 ENCOUNTER — Encounter: Payer: Self-pay | Admitting: Internal Medicine

## 2023-09-09 ENCOUNTER — Telehealth (INDEPENDENT_AMBULATORY_CARE_PROVIDER_SITE_OTHER): Payer: 59 | Admitting: Internal Medicine

## 2023-09-09 DIAGNOSIS — G4700 Insomnia, unspecified: Secondary | ICD-10-CM

## 2023-09-09 DIAGNOSIS — E785 Hyperlipidemia, unspecified: Secondary | ICD-10-CM

## 2023-09-09 DIAGNOSIS — F5104 Psychophysiologic insomnia: Secondary | ICD-10-CM

## 2023-09-09 DIAGNOSIS — B009 Herpesviral infection, unspecified: Secondary | ICD-10-CM

## 2023-09-09 DIAGNOSIS — F411 Generalized anxiety disorder: Secondary | ICD-10-CM

## 2023-09-09 DIAGNOSIS — G43909 Migraine, unspecified, not intractable, without status migrainosus: Secondary | ICD-10-CM | POA: Diagnosis not present

## 2023-09-09 DIAGNOSIS — A6000 Herpesviral infection of urogenital system, unspecified: Secondary | ICD-10-CM

## 2023-09-09 DIAGNOSIS — M8580 Other specified disorders of bone density and structure, unspecified site: Secondary | ICD-10-CM | POA: Insufficient documentation

## 2023-09-09 DIAGNOSIS — R7303 Prediabetes: Secondary | ICD-10-CM

## 2023-09-09 DIAGNOSIS — Z8673 Personal history of transient ischemic attack (TIA), and cerebral infarction without residual deficits: Secondary | ICD-10-CM

## 2023-09-09 DIAGNOSIS — E782 Mixed hyperlipidemia: Secondary | ICD-10-CM

## 2023-09-09 DIAGNOSIS — M8589 Other specified disorders of bone density and structure, multiple sites: Secondary | ICD-10-CM

## 2023-09-09 DIAGNOSIS — G43C1 Periodic headache syndromes in child or adult, intractable: Secondary | ICD-10-CM

## 2023-09-09 MED ORDER — ASPIRIN 81 MG PO TBEC: 81.0000 mg | DELAYED_RELEASE_TABLET | Freq: Every day | ORAL | Status: DC

## 2023-09-09 MED ORDER — TRAZODONE HCL 50 MG PO TABS: 75.0000 mg | ORAL_TABLET | Freq: Every evening | ORAL | 1 refills | Status: DC | PRN

## 2023-09-09 MED ORDER — BUSPIRONE HCL 5 MG PO TABS: 5.0000 mg | ORAL_TABLET | Freq: Three times a day (TID) | ORAL | 1 refills | Status: DC

## 2023-09-09 MED ORDER — ALPRAZOLAM 0.25 MG PO TABS: 0.2500 mg | ORAL_TABLET | Freq: Two times a day (BID) | ORAL | 0 refills | Status: AC | PRN

## 2023-09-09 NOTE — Assessment & Plan Note (Signed)
Will check A1c at annual exam Encourage low-carb diet and exercise for weight loss

## 2023-09-09 NOTE — Assessment & Plan Note (Signed)
Not currently medicated We will monitor

## 2023-09-09 NOTE — Progress Notes (Signed)
Virtual Visit via Video Note  I connected with Erika Hurley on 09/09/23 at  4:00 PM EST by a video enabled telemedicine application and verified that I am speaking with the correct person using two identifiers.  Location: Patient: Work Provider: Office  Persons participating in this video call: Nicki Reaper, NP and Darryl Lavin   I discussed the limitations of evaluation and management by telemedicine and the availability of in person appointments. The patient expressed understanding and agreed to proceed.  History of Present Illness:  Patient due for follow-up of chronic conditions.  Migraines: These occur about once every 2 months.  Triggered by hormones and seasonal changes.  She takes sumatriptan as needed with good relief of symptoms.  She follows with neurology.  HLD with history of stroke: Her last LDL was 110, triglycerides 97, 10/2022.  She denies myalgias on rosuvastatin.  She tries to consume a low-fat diet.  Insomnia: She has difficulty falling and staying asleep.  She takes melatonin and trazadone with some relief of symptoms.  There is no sleep study on file.  Anxiety: Persistent, managed with xanax and buspirone as needed with some relief of symptoms.  She is not currently seeing a therapist.  She denies depression, SI/HI.  Prediabetes: Her last A1c was 5.9%, 10/2022.  She is not taking any oral diabetic medication at this time.  She does not check her sugars.   Osteopenia: She is taking calcium and vitamin D OTC.  She tries to get some weightbearing exercise.  Bone density from 12/2022 reviewed.  General herpes: She denies recent flare.  She is not taking any antiviral medication at this time.  Past Medical History:  Diagnosis Date   Arthritis    Cervical dysplasia    History of abnormal cervical Pap smear    HGSIL   Migraines    Solitary cyst of breast    Stroke [facility])     Current Outpatient Medications  Medication Sig Dispense Refill   ALPRAZolam  (XANAX) 0.25 MG tablet Take 1 tablet (0.25 mg total) by mouth 2 (two) times daily as needed for anxiety. 20 tablet 0   busPIRone (BUSPAR) 5 MG tablet TAKE 1 TABLET BY MOUTH THREE TIMES DAILY . APPOINTMENT REQUIRED FOR FUTURE REFILLS 90 tablet 0   Melatonin 3 MG TABS Take 30 mg by mouth.     rosuvastatin (CRESTOR) 5 MG tablet Take 1 tablet (5 mg total) by mouth daily. 90 tablet 3   SUMAtriptan (IMITREX) 50 MG tablet TAKE 1 TABLET BY MOUTH AT ONSET OF MIGRAINE HEADACHE.  MAY REPEAT IN 2 HOURS IF HEADACHE PERSISTS OR RECURS 10 tablet 1   traZODone (DESYREL) 50 MG tablet Take 1.5-2 tablets (75-100 mg total) by mouth at bedtime as needed for sleep. 180 tablet 0   triamcinolone cream (KENALOG) 0.1 % Apply topically bid prn for itchy areas at elbows for dermatitis. Avoid applying to face, groin, and axilla. Use as directed. 30 g 1   No current facility-administered medications for this visit.    Allergies  Allergen Reactions   Sulfa Antibiotics Anaphylaxis    Family History  Problem Relation Age of Onset   Lung cancer Mother    Hypertension Mother    Hyperlipidemia Mother    Kidney disease Mother    Breast cancer Mother 54       twice   Stroke Father    Heart disease Father    Hypertension Father    Hyperlipidemia Father    Kidney disease Father  Dementia Father    Prostate cancer Maternal Grandfather    Dementia Paternal Grandmother     Social History   Socioeconomic History   Marital status: Media planner    Spouse name: Not on file   Number of children: Not on file   Years of education: Not on file   Highest education level: Not on file  Occupational History   Not on file  Tobacco Use   Smoking status: Never   Smokeless tobacco: Never  Vaping Use   Vaping status: Never Used  Substance and Sexual Activity   Alcohol use: Yes    Alcohol/week: 2.0 standard drinks of alcohol    Types: 2 Glasses of wine per week   Drug use: No   Sexual activity: Yes    Partners: Male     Birth control/protection: Post-menopausal  Other Topics Concern   Not on file  Social History Narrative   Divorced    Employed Doctor, hospital    Social Determinants of Health   Financial Resource Strain: Unknown (08/19/2019)   Received from [facility], [facility]   Overall Financial Resource Strain (CARDIA)    Difficulty of Paying Living Expenses: Patient declined  Food Insecurity: Unknown (08/19/2019)   Received from [facility], [facility]   Hunger Vital Sign    Worried About Running Out of Food in the Last Year: Patient declined    Ran Out of Food in the Last Year: Patient declined  Transportation Needs: Unknown (08/19/2019)   Received from [facility], [facility]   [facility] - Transportation    Lack of Transportation (Medical): Patient declined    Lack of Transportation (Non-Medical): Patient declined  Physical Activity: Unknown (08/19/2019)   Received from [facility], [facility]   Exercise Vital Sign    Days of Exercise per Week: Patient declined    Minutes of Exercise per Session: Patient declined  Stress: Unknown (08/19/2019)   Received from [facility], [facility]   [employer] [facility] - Occupational Stress Questionnaire    Feeling of Stress : Patient declined  Social Connections: Unknown (08/19/2019)   Received from [facility], [facility]   Social Connection and Isolation Panel [NHANES]    Frequency of Communication with Friends and Family: Patient declined    Frequency of Social Gatherings with Friends and Family: Patient declined    Attends Religious Services: Patient declined    Database administrator or Organizations: Patient declined    Attends Banker Meetings: Patient declined    Marital Status:  Patient declined  Catering manager Violence: Not on file     Constitutional: Patient reports intermittent headaches.  Denies fever, malaise, fatigue, or abrupt weight changes.  HEENT: Denies eye pain, eye redness, ear pain, ringing in the ears, wax buildup, runny nose, nasal congestion, bloody nose, or sore throat. Respiratory: Denies difficulty breathing, shortness of breath, cough or sputum production.   Cardiovascular: Denies chest pain, chest tightness, palpitations or swelling in the hands or feet.  Gastrointestinal: Denies abdominal pain, bloating, constipation, diarrhea or blood in the stool.  GU: Denies urgency, frequency, pain with urination, burning sensation, blood in urine, odor or discharge. Musculoskeletal: Denies decrease in range of motion, difficulty with gait, muscle pain or joint pain and swelling.  Skin: Denies redness, rashes, lesions or ulcercations.  Neurological: Patient reports insomnia.  Denies dizziness, difficulty with memory, difficulty with speech or problems with balance and coordination.  Psych: Patient has a history of anxiety.  Denies depression, SI/HI.  No other specific complaints in a complete review of systems (except as listed in HPI above).  Observations/Objective:  Wt Readings from Last 3 Encounters:  10/12/22 126 lb (57.2 kg)  05/22/22 127 lb (57.6 kg)  04/20/22 125 lb (56.7 kg)    General: Appears her stated age, well developed, well nourished in NAD. Pulmonary/Chest: Normal effort. No respiratory distress.  Neurological: Alert and oriented. Coordination normal.  Psychiatric: Mood and affect normal. Behavior is normal. Judgment and thought content normal.    BMET    Component Value Date/Time   NA 142 10/12/2022 0831   K 4.6 10/12/2022 0831   CL 104 10/12/2022 0831   CO2 27 10/12/2022 0831   GLUCOSE 98 10/12/2022 0831   BUN 20 10/12/2022 0831   CREATININE 0.88 10/12/2022 0831   CALCIUM 10.0 10/12/2022 0831   GFRNONAA >60 02/03/2021  1836   GFRNONAA 71 09/12/2020 1005   GFRAA 83 09/12/2020 1005    Lipid Panel     Component Value Date/Time   CHOL 188 10/12/2022 0831   TRIG 97 10/12/2022 0831   HDL 58 10/12/2022 0831   CHOLHDL 3.2 10/12/2022 0831   VLDL 16.6 02/23/2016 0905   LDLCALC 110 (H) 10/12/2022 0831    CBC    Component Value Date/Time   WBC 6.7 10/12/2022 0831   RBC 4.35 10/12/2022 0831   HGB 13.9 10/12/2022 0831   HCT 41.0 10/12/2022 0831   PLT 339 10/12/2022 0831   MCV 94.3 10/12/2022 0831   MCH 32.0 10/12/2022 0831   MCHC 33.9 10/12/2022 0831   RDW 11.8 10/12/2022 0831   LYMPHSABS 1,926 09/12/2020 1005   MONOABS 0.4 09/15/2015 1028   EOSABS 29 09/12/2020 1005   BASOSABS 29 09/12/2020 1005    Hgb A1C Lab Results  Component Value Date   HGBA1C 5.9 (H) 10/12/2022        Assessment and Plan:   RTC in 1 month for your annual exam  Follow Up Instructions:    I discussed the assessment and treatment plan with the patient. The patient was provided an opportunity to ask questions and all were answered. The patient agreed with the plan and demonstrated an understanding of the instructions.   The patient was advised to call back or seek an in-person evaluation if the symptoms worsen or if the condition fails to improve as anticipated.    Nicki Reaper, NP

## 2023-09-09 NOTE — Patient Instructions (Signed)
Prediabetes Eating Plan Prediabetes is a condition that causes blood sugar (glucose) levels to be higher than normal. This increases the risk for developing type 2 diabetes (type 2 diabetes mellitus). Working with a health care provider or nutrition specialist (dietitian) to make diet and lifestyle changes can help prevent the onset of diabetes. These changes may help you: Control your blood glucose levels. Improve your cholesterol levels. Manage your blood pressure. What are tips for following this plan? Reading food labels Read food labels to check the amount of fat, salt (sodium), and sugar in prepackaged foods. Avoid foods that have: Saturated fats. Trans fats. Added sugars. Avoid foods that have more than 300 milligrams (mg) of sodium per serving. Limit your sodium intake to less than 2,300 mg each day. Shopping Avoid buying pre-made and processed foods. Avoid buying drinks with added sugar. Cooking Cook with olive oil. Do not use butter, lard, or ghee. Bake, broil, grill, steam, or boil foods. Avoid frying. Meal planning  Work with your dietitian to create an eating plan that is right for you. This may include tracking how many calories you take in each day. Use a food diary, notebook, or mobile application to track what you eat at each meal. Consider following a Mediterranean diet. This includes: Eating several servings of fresh fruits and vegetables each day. Eating fish at least twice a week. Eating one serving each day of whole grains, beans, nuts, and seeds. Using olive oil instead of other fats. Limiting alcohol. Limiting red meat. Using nonfat or low-fat dairy products. Consider following a plant-based diet. This includes dietary choices that focus on eating mostly vegetables and fruit, grains, beans, nuts, and seeds. If you have high blood pressure, you may need to limit your sodium intake or follow a diet such as the DASH (Dietary Approaches to Stop Hypertension) eating  plan. The DASH diet aims to lower high blood pressure. Lifestyle Set weight loss goals with help from your health care team. It is recommended that most people with prediabetes lose 7% of their body weight. Exercise for at least 30 minutes 5 or more days a week. Attend a support group or seek support from a mental health counselor. Take over-the-counter and prescription medicines only as told by your health care provider. What foods are recommended? Fruits Berries. Bananas. Apples. Oranges. Grapes. Papaya. Mango. Pomegranate. Kiwi. Grapefruit. Cherries. Vegetables Lettuce. Spinach. Peas. Beets. Cauliflower. Cabbage. Broccoli. Carrots. Tomatoes. Squash. Eggplant. Herbs. Peppers. Onions. Cucumbers. Brussels sprouts. Grains Whole grains, such as whole-wheat or whole-grain breads, crackers, cereals, and pasta. Unsweetened oatmeal. Bulgur. Barley. Quinoa. Brown rice. Corn or whole-wheat flour tortillas or taco shells. Meats and other proteins Seafood. Poultry without skin. Lean cuts of pork and beef. Tofu. Eggs. Nuts. Beans. Dairy Low-fat or fat-free dairy products, such as yogurt, cottage cheese, and cheese. Beverages Water. Tea. Coffee. Sugar-free or diet soda. Seltzer water. Low-fat or nonfat milk. Milk alternatives, such as soy or almond milk. Fats and oils Olive oil. Canola oil. Sunflower oil. Grapeseed oil. Avocado. Walnuts. Sweets and desserts Sugar-free or low-fat pudding. Sugar-free or low-fat ice cream and other frozen treats. Seasonings and condiments Herbs. Sodium-free spices. Mustard. Relish. Low-salt, low-sugar ketchup. Low-salt, low-sugar barbecue sauce. Low-fat or fat-free mayonnaise. The items listed above may not be a complete list of recommended foods and beverages. Contact a dietitian for more information. What foods are not recommended? Fruits Fruits canned with syrup. Vegetables Canned vegetables. Frozen vegetables with butter or cream sauce. Grains Refined white  flour and flour   products, such as bread, pasta, snack foods, and cereals. Meats and other proteins Fatty cuts of meat. Poultry with skin. Breaded or fried meat. Processed meats. Dairy Full-fat yogurt, cheese, or milk. Beverages Sweetened drinks, such as iced tea and soda. Fats and oils Butter. Lard. Ghee. Sweets and desserts Baked goods, such as cake, cupcakes, pastries, cookies, and cheesecake. Seasonings and condiments Spice mixes with added salt. Ketchup. Barbecue sauce. Mayonnaise. The items listed above may not be a complete list of foods and beverages that are not recommended. Contact a dietitian for more information. Where to find more information American Diabetes Association: www.diabetes.org Summary You may need to make diet and lifestyle changes to help prevent the onset of diabetes. These changes can help you control blood sugar, improve cholesterol levels, and manage blood pressure. Set weight loss goals with help from your health care team. It is recommended that most people with prediabetes lose 7% of their body weight. Consider following a Mediterranean diet. This includes eating plenty of fresh fruits and vegetables, whole grains, beans, nuts, seeds, fish, and low-fat dairy, and using olive oil instead of other fats. This information is not intended to replace advice given to you by your health care provider. Make sure you discuss any questions you have with your health care provider. Document Revised: 01/21/2020 Document Reviewed: 01/21/2020 Elsevier Patient Education  2024 Elsevier Inc.  

## 2023-09-09 NOTE — Assessment & Plan Note (Signed)
Continue calcium and vitamin D OTC Encouraged daily weightbearing exercise 

## 2023-09-09 NOTE — Assessment & Plan Note (Signed)
Will check lipid profile annual exam Encourage low-fat diet Continue rosuvastatin

## 2023-09-09 NOTE — Assessment & Plan Note (Addendum)
Continue melatonin and trazodone Will monitor

## 2023-09-09 NOTE — Assessment & Plan Note (Signed)
Stable on her current dose of Xanax and buspirone, refilled today Support offered

## 2023-09-09 NOTE — Assessment & Plan Note (Signed)
Try to avoid triggers for your migraines Continue sumatriptan as needed

## 2023-09-09 NOTE — Assessment & Plan Note (Signed)
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

## 2023-10-07 ENCOUNTER — Ambulatory Visit (INDEPENDENT_AMBULATORY_CARE_PROVIDER_SITE_OTHER): Payer: 59 | Admitting: Dermatology

## 2023-10-08 ENCOUNTER — Encounter: Payer: 59 | Admitting: Dermatology

## 2023-10-15 ENCOUNTER — Ambulatory Visit (INDEPENDENT_AMBULATORY_CARE_PROVIDER_SITE_OTHER): Payer: 59 | Admitting: Internal Medicine

## 2023-10-15 ENCOUNTER — Encounter: Payer: Self-pay | Admitting: Internal Medicine

## 2023-10-15 VITALS — BP 108/68 | HR 61 | Ht 69.0 in | Wt 130.4 lb

## 2023-10-15 DIAGNOSIS — L309 Dermatitis, unspecified: Secondary | ICD-10-CM

## 2023-10-15 DIAGNOSIS — Z0001 Encounter for general adult medical examination with abnormal findings: Secondary | ICD-10-CM | POA: Diagnosis not present

## 2023-10-15 DIAGNOSIS — R7303 Prediabetes: Secondary | ICD-10-CM

## 2023-10-15 DIAGNOSIS — Z1231 Encounter for screening mammogram for malignant neoplasm of breast: Secondary | ICD-10-CM

## 2023-10-15 DIAGNOSIS — E782 Mixed hyperlipidemia: Secondary | ICD-10-CM | POA: Diagnosis not present

## 2023-10-15 MED ORDER — METHOCARBAMOL 500 MG PO TABS: 500.0000 mg | ORAL_TABLET | Freq: Three times a day (TID) | ORAL | 0 refills | Status: DC | PRN

## 2023-10-15 MED ORDER — TRIAMCINOLONE ACETONIDE 0.1 % EX CREA: TOPICAL_CREAM | CUTANEOUS | 1 refills | Status: AC

## 2023-10-15 MED ORDER — METOPROLOL SUCCINATE ER 25 MG PO TB24: 25.0000 mg | ORAL_TABLET | Freq: Every day | ORAL | 0 refills | Status: DC | PRN

## 2023-10-15 NOTE — Patient Instructions (Signed)
Health Maintenance for Postmenopausal Women Menopause is a normal process in which your ability to get pregnant comes to an end. This process happens slowly over many months or years, usually between the ages of 48 and 55. Menopause is complete when you have missed your menstrual period for 12 months. It is important to talk with your health care provider about some of the most common conditions that affect women after menopause (postmenopausal women). These include heart disease, cancer, and bone loss (osteoporosis). Adopting a healthy lifestyle and getting preventive care can help to promote your health and wellness. The actions you take can also lower your chances of developing some of these common conditions. What are the signs and symptoms of menopause? During menopause, you may have the following symptoms: Hot flashes. These can be moderate or severe. Night sweats. Decrease in sex drive. Mood swings. Headaches. Tiredness (fatigue). Irritability. Memory problems. Problems falling asleep or staying asleep. Talk with your health care provider about treatment options for your symptoms. Do I need hormone replacement therapy? Hormone replacement therapy is effective in treating symptoms that are caused by menopause, such as hot flashes and night sweats. Hormone replacement carries certain risks, especially as you become older. If you are thinking about using estrogen or estrogen with progestin, discuss the benefits and risks with your health care provider. How can I reduce my risk for heart disease and stroke? The risk of heart disease, heart attack, and stroke increases as you age. One of the causes may be a change in the body's hormones during menopause. This can affect how your body uses dietary fats, triglycerides, and cholesterol. Heart attack and stroke are medical emergencies. There are many things that you can do to help prevent heart disease and stroke. Watch your blood pressure High  blood pressure causes heart disease and increases the risk of stroke. This is more likely to develop in people who have high blood pressure readings or are overweight. Have your blood pressure checked: Every 3-5 years if you are 18-39 years of age. Every year if you are 40 years old or older. Eat a healthy diet  Eat a diet that includes plenty of vegetables, fruits, low-fat dairy products, and lean protein. Do not eat a lot of foods that are high in solid fats, added sugars, or sodium. Get regular exercise Get regular exercise. This is one of the most important things you can do for your health. Most adults should: Try to exercise for at least 150 minutes each week. The exercise should increase your heart rate and make you sweat (moderate-intensity exercise). Try to do strengthening exercises at least twice each week. Do these in addition to the moderate-intensity exercise. Spend less time sitting. Even light physical activity can be beneficial. Other tips Work with your health care provider to achieve or maintain a healthy weight. Do not use any products that contain nicotine or tobacco. These products include cigarettes, chewing tobacco, and vaping devices, such as e-cigarettes. If you need help quitting, ask your health care provider. Know your numbers. Ask your health care provider to check your cholesterol and your blood sugar (glucose). Continue to have your blood tested as directed by your health care provider. Do I need screening for cancer? Depending on your health history and family history, you may need to have cancer screenings at different stages of your life. This may include screening for: Breast cancer. Cervical cancer. Lung cancer. Colorectal cancer. What is my risk for osteoporosis? After menopause, you may be   at increased risk for osteoporosis. Osteoporosis is a condition in which bone destruction happens more quickly than new bone creation. To help prevent osteoporosis or  the bone fractures that can happen because of osteoporosis, you may take the following actions: If you are 19-50 years old, get at least 1,000 mg of calcium and at least 600 international units (IU) of vitamin D per day. If you are older than age 50 but younger than age 70, get at least 1,200 mg of calcium and at least 600 international units (IU) of vitamin D per day. If you are older than age 70, get at least 1,200 mg of calcium and at least 800 international units (IU) of vitamin D per day. Smoking and drinking excessive alcohol increase the risk of osteoporosis. Eat foods that are rich in calcium and vitamin D, and do weight-bearing exercises several times each week as directed by your health care provider. How does menopause affect my mental health? Depression may occur at any age, but it is more common as you become older. Common symptoms of depression include: Feeling depressed. Changes in sleep patterns. Changes in appetite or eating patterns. Feeling an overall lack of motivation or enjoyment of activities that you previously enjoyed. Frequent crying spells. Talk with your health care provider if you think that you are experiencing any of these symptoms. General instructions See your health care provider for regular wellness exams and vaccines. This may include: Scheduling regular health, dental, and eye exams. Getting and maintaining your vaccines. These include: Influenza vaccine. Get this vaccine each year before the flu season begins. Pneumonia vaccine. Shingles vaccine. Tetanus, diphtheria, and pertussis (Tdap) booster vaccine. Your health care provider may also recommend other immunizations. Tell your health care provider if you have ever been abused or do not feel safe at home. Summary Menopause is a normal process in which your ability to get pregnant comes to an end. This condition causes hot flashes, night sweats, decreased interest in sex, mood swings, headaches, or lack  of sleep. Treatment for this condition may include hormone replacement therapy. Take actions to keep yourself healthy, including exercising regularly, eating a healthy diet, watching your weight, and checking your blood pressure and blood sugar levels. Get screened for cancer and depression. Make sure that you are up to date with all your vaccines. This information is not intended to replace advice given to you by your health care provider. Make sure you discuss any questions you have with your health care provider. Document Revised: 03/13/2021 Document Reviewed: 03/13/2021 Elsevier Patient Education  2024 Elsevier Inc.  

## 2023-10-15 NOTE — Progress Notes (Signed)
Subjective:    Patient ID: Erika Hurley, female    DOB: 1966/10/04, 57 y.o.   MRN: [id number]  HPI  Patient presents the clinic today for her annual exam.  Flu: 08/2019 Tetanus: 05/2017 COVID: Never Shingrix: x 2, [facility]  Pap smear: 10/2019 Mammogram: 12/2022 Bone density: 12/2022 Colon screening: 08/2021 Vision screening: annually Dentist: biannually  Diet: She does eat meat. She consumes fruits and veggies. She tries to avoid fried foods. She drinks mostly water and Mt Dew Exercise: None  Review of Systems   Past Medical History:  Diagnosis Date   Arthritis    Cervical dysplasia    History of abnormal cervical Pap smear    HGSIL   Migraines    Solitary cyst of breast    Stroke [facility])     Current Outpatient Medications  Medication Sig Dispense Refill   ALPRAZolam (XANAX) 0.25 MG tablet Take 1 tablet (0.25 mg total) by mouth 2 (two) times daily as needed for anxiety. 20 tablet 0   aspirin EC 81 MG tablet Take 1 tablet (81 mg total) by mouth daily. Swallow whole.     busPIRone (BUSPAR) 5 MG tablet Take 1 tablet (5 mg total) by mouth 3 (three) times daily. 270 tablet 1   Melatonin 3 MG TABS Take 30 mg by mouth.     rosuvastatin (CRESTOR) 5 MG tablet Take 1 tablet (5 mg total) by mouth daily. 90 tablet 3   SUMAtriptan (IMITREX) 50 MG tablet TAKE 1 TABLET BY MOUTH AT ONSET OF MIGRAINE HEADACHE.  MAY REPEAT IN 2 HOURS IF HEADACHE PERSISTS OR RECURS 10 tablet 1   traZODone (DESYREL) 50 MG tablet Take 1.5-2 tablets (75-100 mg total) by mouth at bedtime as needed for sleep. 180 tablet 1   triamcinolone cream (KENALOG) 0.1 % Apply topically bid prn for itchy areas at elbows for dermatitis. Avoid applying to face, groin, and axilla. Use as directed. 30 g 1   No current facility-administered medications for this visit.    Allergies  Allergen Reactions   Sulfa Antibiotics Anaphylaxis    Family History  Problem Relation Age of Onset   Lung cancer Mother     Hypertension Mother    Hyperlipidemia Mother    Kidney disease Mother    Breast cancer Mother 80       twice   Stroke Father    Heart disease Father    Hypertension Father    Hyperlipidemia Father    Kidney disease Father    Dementia Father    Prostate cancer Maternal Grandfather    Dementia Paternal Grandmother     Social History   Socioeconomic History   Marital status: Media planner    Spouse name: Not on file   Number of children: Not on file   Years of education: Not on file   Highest education level: Associate degree: occupational, Scientist, product/process development, or vocational program  Occupational History   Not on file  Tobacco Use   Smoking status: Never   Smokeless tobacco: Never  Vaping Use   Vaping status: Never Used  Substance and Sexual Activity   Alcohol use: Yes    Alcohol/week: 2.0 standard drinks of alcohol    Types: 2 Glasses of wine per week   Drug use: No   Sexual activity: Yes    Partners: Male    Birth control/protection: Post-menopausal  Other Topics Concern   Not on file  Social History Narrative   Divorced    Employed Loss adjuster, chartered  [employer]    Social Determinants of Health   Financial Resource Strain: Low Risk  (10/14/2023)   Overall Financial Resource Strain (CARDIA)    Difficulty of Paying Living Expenses: Not hard at all  Food Insecurity: No Food Insecurity (10/14/2023)   Hunger Vital Sign    Worried About Running Out of Food in the Last Year: Never true    Ran Out of Food in the Last Year: Never true  Transportation Needs: No Transportation Needs (10/14/2023)   PRAPARE - Administrator, Civil Service (Medical): No    Lack of Transportation (Non-Medical): No  Physical Activity: Insufficiently Active (10/14/2023)   Exercise Vital Sign    Days of Exercise per Week: 2 days    Minutes of Exercise per Session: 30 min  Stress: Stress Concern Present (10/14/2023)   [employer] of Occupational Health - Occupational Stress Questionnaire     Feeling of Stress : Rather much  Social Connections: Moderately Isolated (10/14/2023)   Social Connection and Isolation Panel [NHANES]    Frequency of Communication with Friends and Family: More than three times a week    Frequency of Social Gatherings with Friends and Family: Once a week    Attends Religious Services: More than 4 times per year    Active Member of [employer] or Organizations: No    Attends Engineer, structural: Not on file    Marital Status: Divorced  Intimate Partner Violence: Not on file     Constitutional: Patient reports intermittent headaches.  Denies fever, malaise, fatigue, or abrupt weight changes.  HEENT: Denies eye pain, eye redness, ear pain, ringing in the ears, wax buildup, runny nose, nasal congestion, bloody nose, or sore throat. Respiratory: Denies difficulty breathing, shortness of breath, cough or sputum production.   Cardiovascular: Pt reports intermittent palpitations. Denies chest pain, chest tightness, or swelling in the hands or feet.  Gastrointestinal: Denies abdominal pain, bloating, constipation, diarrhea or blood in the stool.  GU: Denies urgency, frequency, pain with urination, burning sensation, blood in urine, odor or discharge. Musculoskeletal: Pt reports chronic back pain, left shoulder pain. Denies decrease in range of motion, difficulty with gait, muscle pain or joint swelling.  Skin: Denies redness, rashes, lesions or ulcercations.  Neurological: Patient reports insomnia.  Denies dizziness, difficulty with memory, difficulty with speech or problems with balance and coordination.  Psych: Patient has a history of anxiety.  Denies depression, SI/HI.  No other specific complaints in a complete review of systems (except as listed in HPI above).   Objective:   Physical Exam  BP 108/68   Pulse 61   Ht 5\' 9"  (1.753 m)   Wt 130 lb 6.4 oz (59.1 kg)   SpO2 100%   BMI 19.26 kg/m    Wt Readings from Last 3 Encounters:  10/12/22 126 lb  (57.2 kg)  05/22/22 127 lb (57.6 kg)  04/20/22 125 lb (56.7 kg)    General: Appears her stated age, well developed, well nourished in NAD. Skin: Warm, dry and intact.  HEENT: Head: normal shape and size; Eyes: sclera white, no icterus, conjunctiva pink, PERRLA and EOMs intact;  Neck:  Neck supple, trachea midline. No masses, lumps or thyromegaly present.  Cardiovascular: Normal rate and rhythm. S1,S2 noted.  No murmur, rubs or gallops noted. No JVD or BLE edema. No carotid bruits noted. Pulmonary/Chest: Normal effort and positive vesicular breath sounds. No respiratory distress. No wheezes, rales or ronchi noted.  Abdomen: Normal bowel sounds.  Musculoskeletal: Strength 5/5  BUE/BLE.  No difficulty with gait.  Neurological: Alert and oriented. Cranial nerves II-XII grossly intact. Coordination normal.  Psychiatric: Mood and affect normal. Behavior is normal. Judgment and thought content normal.    BMET    Component Value Date/Time   NA 142 10/12/2022 0831   K 4.6 10/12/2022 0831   CL 104 10/12/2022 0831   CO2 27 10/12/2022 0831   GLUCOSE 98 10/12/2022 0831   BUN 20 10/12/2022 0831   CREATININE 0.88 10/12/2022 0831   CALCIUM 10.0 10/12/2022 0831   GFRNONAA >60 02/03/2021 1836   GFRNONAA 71 09/12/2020 1005   GFRAA 83 09/12/2020 1005    Lipid Panel     Component Value Date/Time   CHOL 188 10/12/2022 0831   TRIG 97 10/12/2022 0831   HDL 58 10/12/2022 0831   CHOLHDL 3.2 10/12/2022 0831   VLDL 16.6 02/23/2016 0905   LDLCALC 110 (H) 10/12/2022 0831    CBC    Component Value Date/Time   WBC 6.7 10/12/2022 0831   RBC 4.35 10/12/2022 0831   HGB 13.9 10/12/2022 0831   HCT 41.0 10/12/2022 0831   PLT 339 10/12/2022 0831   MCV 94.3 10/12/2022 0831   MCH 32.0 10/12/2022 0831   MCHC 33.9 10/12/2022 0831   RDW 11.8 10/12/2022 0831   LYMPHSABS 1,926 09/12/2020 1005   MONOABS 0.4 09/15/2015 1028   EOSABS 29 09/12/2020 1005   BASOSABS 29 09/12/2020 1005    Hgb A1C Lab  Results  Component Value Date   HGBA1C 5.9 (H) 10/12/2022           Assessment & Plan:   Preventative Health Maintenance:  She declines flu shot today Tetanus UTD Encouraged her to get her COVID-vaccine Shingrix UTD per her report Pap smear UTD Mammogram ordered-she will call to schedule Bone density UTD Colon screening UTD Encouraged her to consume a balanced diet and exercise regimen Advised her to see an eye doctor and dentist annually We will check CBC, c-Met,  lipid profile and A1c today  RTC in 6 months, follow-up chronic conditions Nicki Reaper, NP

## 2023-10-16 LAB — COMPLETE METABOLIC PANEL WITH GFR
AG Ratio: 1.7 (calc) (ref 1.0–2.5)
ALT: 9 U/L (ref 6–29)
AST: 18 U/L (ref 10–35)
Albumin: 4.7 g/dL (ref 3.6–5.1)
Alkaline phosphatase (APISO): 71 U/L (ref 37–153)
BUN: 15 mg/dL (ref 7–25)
CO2: 29 mmol/L (ref 20–32)
Calcium: 9.7 mg/dL (ref 8.6–10.4)
Chloride: 102 mmol/L (ref 98–110)
Creat: 0.81 mg/dL (ref 0.50–1.03)
Globulin: 2.7 g/dL (ref 1.9–3.7)
Glucose, Bld: 105 mg/dL — ABNORMAL HIGH (ref 65–99)
Potassium: 4.4 mmol/L (ref 3.5–5.3)
Sodium: 140 mmol/L (ref 135–146)
Total Bilirubin: 0.8 mg/dL (ref 0.2–1.2)
Total Protein: 7.4 g/dL (ref 6.1–8.1)
eGFR: 84 mL/min/{1.73_m2} (ref >=60)

## 2023-10-16 LAB — LIPID PANEL
Cholesterol: 203 mg/dL — ABNORMAL HIGH (ref <200)
HDL: 57 mg/dL (ref >=50)
LDL Cholesterol (Calc): 123 mg/dL — ABNORMAL HIGH
Non-HDL Cholesterol (Calc): 146 mg/dL — ABNORMAL HIGH (ref <130)
Total CHOL/HDL Ratio: 3.6 (calc) (ref <5.0)
Triglycerides: 115 mg/dL (ref <150)

## 2023-10-16 LAB — CBC
HCT: 40.3 % (ref 35.0–45.0)
Hemoglobin: 13.6 g/dL (ref 11.7–15.5)
MCH: 32.9 pg (ref 27.0–33.0)
MCHC: 33.7 g/dL (ref 32.0–36.0)
MCV: 97.6 fL (ref 80.0–100.0)
MPV: 10.3 fL (ref 7.5–12.5)
Platelets: 296 10*3/uL (ref 140–400)
RBC: 4.13 10*6/uL (ref 3.80–5.10)
RDW: 11.8 % (ref 11.0–15.0)
WBC: 6.1 10*3/uL (ref 3.8–10.8)

## 2023-10-16 LAB — HEMOGLOBIN A1C
Hgb A1c MFr Bld: 5.8 %{Hb} — ABNORMAL HIGH (ref <5.7)
Mean Plasma Glucose: 120 mg/dL
eAG (mmol/L): 6.6 mmol/L

## 2023-10-17 ENCOUNTER — Telehealth: Payer: Self-pay

## 2023-10-17 NOTE — Telephone Encounter (Signed)
Paperwork follow up. Sent patient MyChart message.

## 2023-10-28 ENCOUNTER — Ambulatory Visit: Payer: 59 | Admitting: Dermatology

## 2023-10-28 DIAGNOSIS — L578 Other skin changes due to chronic exposure to nonionizing radiation: Secondary | ICD-10-CM | POA: Diagnosis not present

## 2023-10-28 DIAGNOSIS — L814 Other melanin hyperpigmentation: Secondary | ICD-10-CM

## 2023-10-28 DIAGNOSIS — D1801 Hemangioma of skin and subcutaneous tissue: Secondary | ICD-10-CM

## 2023-10-28 DIAGNOSIS — L853 Xerosis cutis: Secondary | ICD-10-CM

## 2023-10-28 DIAGNOSIS — D225 Melanocytic nevi of trunk: Secondary | ICD-10-CM

## 2023-10-28 DIAGNOSIS — L570 Actinic keratosis: Secondary | ICD-10-CM

## 2023-10-28 DIAGNOSIS — W908XXA Exposure to other nonionizing radiation, initial encounter: Secondary | ICD-10-CM

## 2023-10-28 DIAGNOSIS — Z1283 Encounter for screening for malignant neoplasm of skin: Secondary | ICD-10-CM

## 2023-10-28 DIAGNOSIS — D229 Melanocytic nevi, unspecified: Secondary | ICD-10-CM

## 2023-10-28 DIAGNOSIS — Z7189 Other specified counseling: Secondary | ICD-10-CM

## 2023-10-28 DIAGNOSIS — L821 Other seborrheic keratosis: Secondary | ICD-10-CM

## 2023-10-28 DIAGNOSIS — L565 Disseminated superficial actinic porokeratosis (DSAP): Secondary | ICD-10-CM

## 2023-10-28 NOTE — Patient Instructions (Signed)

## 2023-10-28 NOTE — Progress Notes (Signed)
Follow-Up Visit   Subjective  Erika Hurley is a 57 y.o. female who presents for the following: Skin Cancer Screening and Full Body Skin Exam  The patient presents for Total-Body Skin Exam (TBSE) for skin cancer screening and mole check. The patient has spots, moles and lesions to be evaluated, some may be new or changing. She has a few dark spots on the right cheek to check. She has Lovastatin/Cholesterol cream for DSAP of the arms and legs, but doesn't use it like she should. No history of skin cancer.    The following portions of the chart were reviewed this encounter and updated as appropriate: medications, allergies, medical history  Review of Systems:  No other skin or systemic complaints except as noted in HPI or Assessment and Plan.  Objective  Well appearing patient in no apparent distress; mood and affect are within normal limits.  A full examination was performed including scalp, head, eyes, ears, nose, lips, neck, chest, axillae, abdomen, back, buttocks, bilateral upper extremities, bilateral lower extremities, hands, feet, fingers, toes, fingernails, and toenails. All findings within normal limits unless otherwise noted below.   Relevant physical exam findings are noted in the Assessment and Plan.  L lower leg/pretibia x 3 (3) Pink scaly macules and papules.  Assessment & Plan   SKIN CANCER SCREENING PERFORMED TODAY.  ACTINIC DAMAGE - Chronic condition, secondary to cumulative UV/sun exposure - diffuse scaly erythematous macules with underlying dyspigmentation - Recommend daily broad spectrum sunscreen SPF 30+ to sun-exposed areas, reapply every 2 hours as needed.  - Staying in the shade or wearing long sleeves, sun glasses (UVA+UVB protection) and wide brim hats (4-inch brim around the entire circumference of the hat) are also recommended for sun protection.  - Call for new or changing lesions.  SEBORRHEIC KERATOSES, HEMANGIOMAS - Benign normal skin lesions -  Benign-appearing - Call for any changes  LENTIGINES Exam: scattered tan macules of the face Due to sun exposure Treatment Plan: Benign-appearing, observe. Recommend daily broad spectrum sunscreen SPF 30+ to sun-exposed areas, reapply every 2 hours as needed.  Trade sample of Neutrogena Tinted with SPF. Call for any changes.  Discussed hydroquinone cream, patient defers today.  Discussed BBL- pt defers Counseling for BBL / IPL / Laser and Coordination of Care Discussed the treatment option of Broad Band Light (BBL) /Intense Pulsed Light (IPL)/ Laser for skin discoloration, including brown spots and redness.  Typically we recommend at least 1-3 treatment sessions about 5-8 weeks apart for best results.  Cannot have tanned skin when BBL performed, and regular use of sunscreen/photoprotection is advised after the procedure to help maintain results. The patient's condition may also require "maintenance treatments" in the future.  The fee for BBL / laser treatments is $350 per treatment session for the whole face.  A fee can be quoted for other parts of the body.  Insurance typically does not pay for BBL/laser treatments and therefore the fee is an out-of-pocket cost. Recommend prophylactic valtrex treatment. Once scheduled for procedure, will send Rx in prior to patient's appointment.    MELANOCYTIC NEVI - Tan-brown and/or pink-flesh-colored symmetric macules and papules; flesh tan papule at left flank - Left Flank at braline 4 x 3 mm  speckled tan macule, darker center - Benign appearing on exam today - Observation - Call clinic for new or changing moles - Recommend daily use of broad spectrum spf 30+ sunscreen to sun-exposed areas.   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   Chronic and persistent condition with duration or expected duration over one year. Condition is symptomatic/  bothersome to patient. Not currently at goal.    DSAP is a chronic inherited condition of sun-exposed skin, most commonly affecting the arms and legs.  It is difficult to treat.  Recommend photoprotection and regular use of spf 30 or higher sunscreen to prevent worsening of condition and precancerous changes.   Treatment Plan:  Continue Cholesterol 2% Lovastatin 2% Cream 240 gm- Apply twice daily as directed to affected areas arms and legs.  Sent to [facility].  Xerosis - diffuse xerotic patches - recommend gentle, hydrating skin care - gentle skin care handout given  AK (ACTINIC KERATOSIS) (3) L lower leg/pretibia x 3 (3) Actinic keratoses are precancerous spots that appear secondary to cumulative UV radiation exposure/sun exposure over time. They are chronic with expected duration over 1 year. A portion of actinic keratoses will progress to squamous cell carcinoma of the skin. It is not possible to reliably predict which spots will progress to skin cancer and so treatment is recommended to prevent development of skin cancer.  Recommend daily broad spectrum sunscreen SPF 30+ to sun-exposed areas, reapply every 2 hours as needed.  Recommend staying in the shade or wearing long sleeves, sun glasses (UVA+UVB protection) and wide brim hats (4-inch brim around the entire circumference of the hat). Call for new or changing lesions. Destruction of lesion - L lower leg/pretibia x 3 (3)  Destruction method: cryotherapy   Informed consent: discussed and consent obtained   Lesion destroyed using liquid nitrogen: Yes   Region frozen until ice ball extended beyond lesion: Yes   Outcome: patient tolerated procedure well with no complications   Post-procedure details: wound care instructions given   Additional details:  Prior to procedure, discussed risks of blister formation, small wound, skin dyspigmentation, or rare scar following cryotherapy. Recommend Vaseline ointment to treated areas while  healing.  ACTINIC SKIN DAMAGE   SKIN CANCER SCREENING   LENTIGO   SEBORRHEIC KERATOSIS   HEMANGIOMA OF SKIN   NEVUS   DSAP (DISSEMINATED SUPERFICIAL ACTINIC POROKERATOSIS)   XEROSIS CUTIS   Return in about 1 year (around 10/27/2024) for TBSE.  [name], CMA, am acting as scribe for Willeen Niece, MD .   Documentation: I have reviewed the above documentation for accuracy and completeness, and I agree with the above.  Willeen Niece, MD

## 2023-11-04 ENCOUNTER — Encounter: Payer: 59 | Admitting: Dermatology

## 2023-11-26 ENCOUNTER — Encounter: Payer: Self-pay | Admitting: Internal Medicine

## 2023-12-21 IMAGING — MG MM DIGITAL SCREENING BILAT W/ TOMO AND CAD
8 series · 9 of 24 positions shown · non-contrast
Comparison: Previous exam(s).

CLINICAL DATA: Screening.

EXAM:
DIGITAL SCREENING BILATERAL MAMMOGRAM WITH TOMOSYNTHESIS AND CAD
TECHNIQUE: Bilateral screening digital craniocaudal and mediolateral oblique
mammograms were obtained. Bilateral screening digital breast
tomosynthesis was performed. The images were evaluated with
computer-aided detection.

[R CC synth-2D]
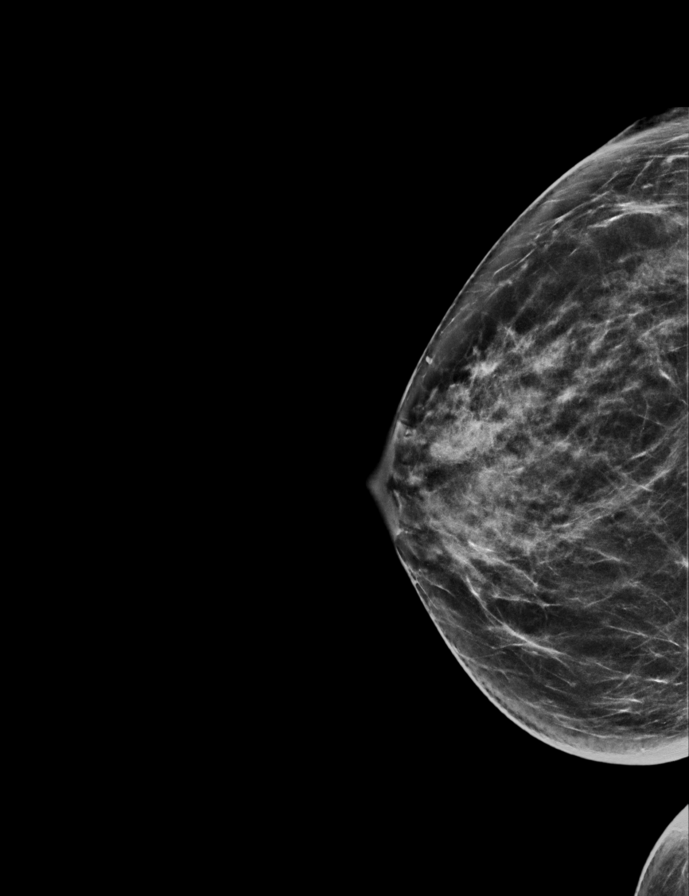

[L CC synth-2D]
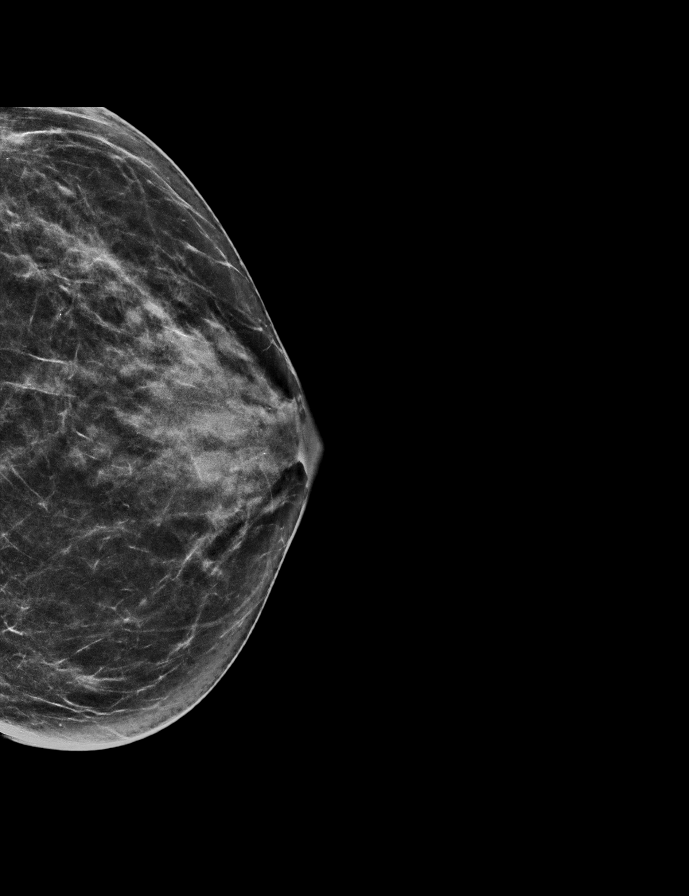

[L MLO synth-2D]
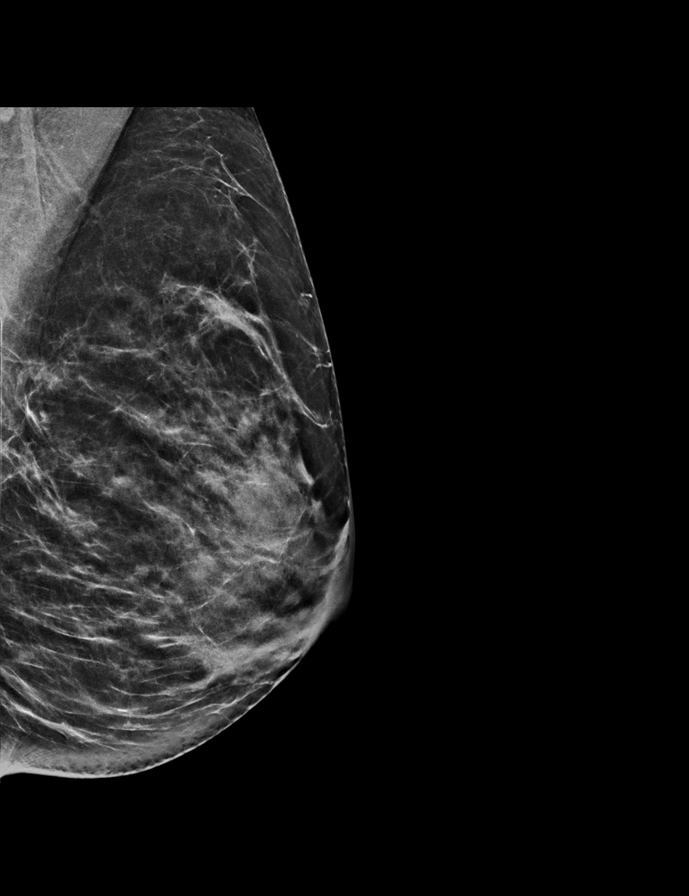

[R MLO synth-2D]
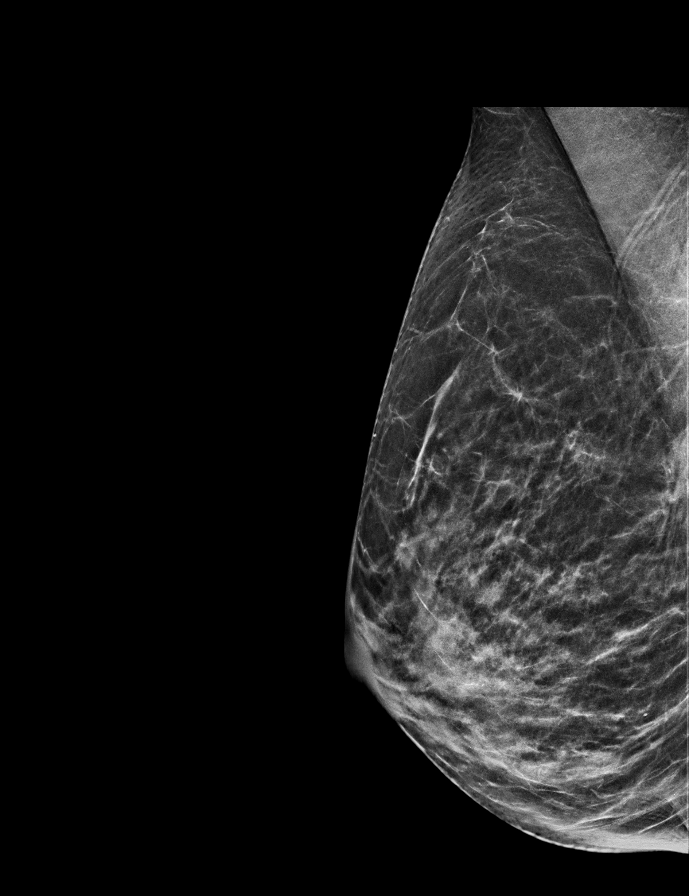

[L CC tomo · 2 of 60 frames shown]
[frame 20/60]
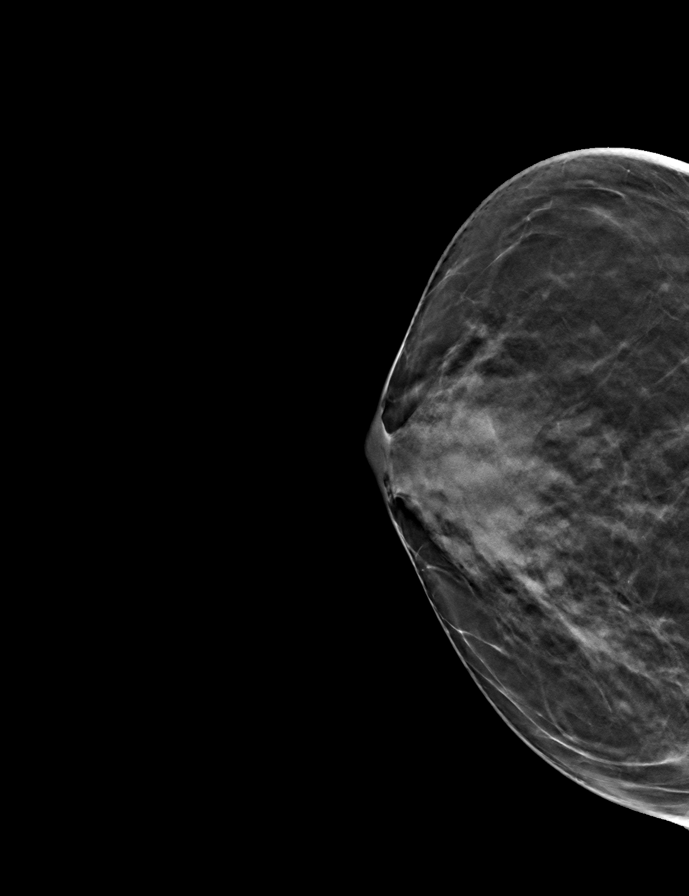
[frame 31/60]
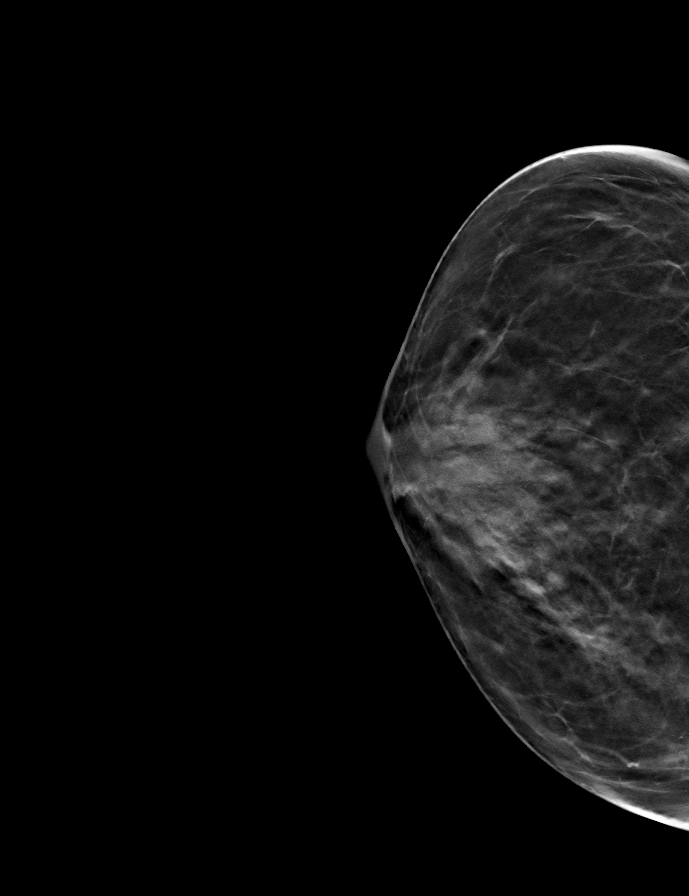

[R CC tomo · tomo slice 31/62.0]
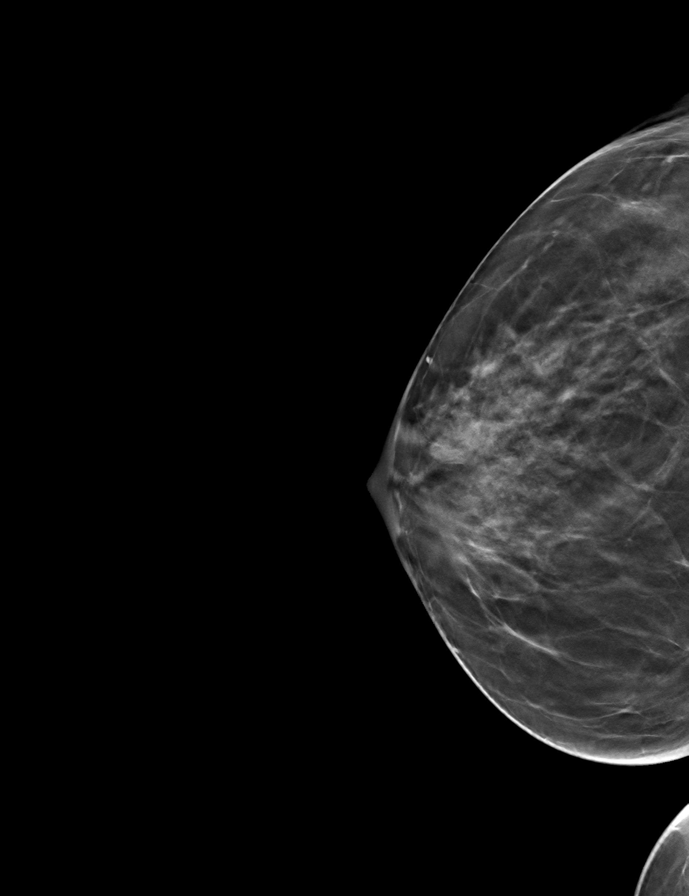

[L MLO tomo · tomo slice 30/59.0]
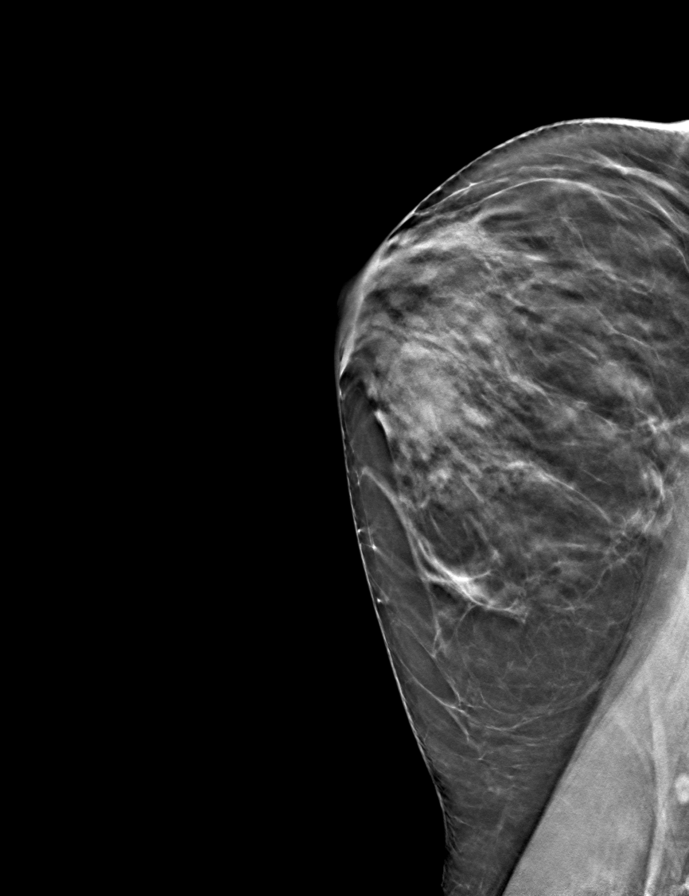

[R MLO tomo · tomo slice 29/58.0]
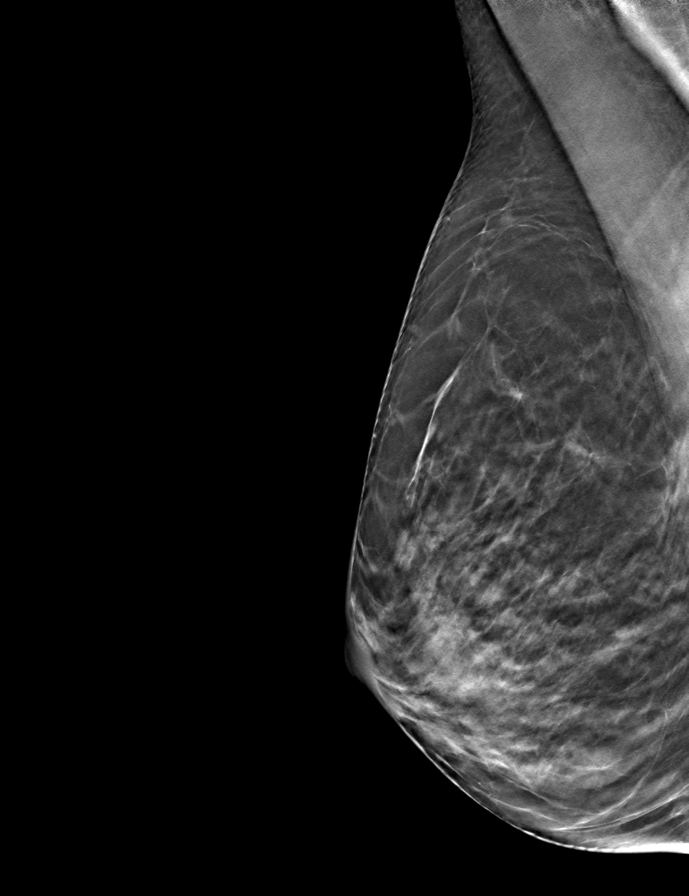

[9 of 24 positions shown; findings below may reference images not displayed]

ACR Breast Density Category c: The breast tissue is heterogeneously
dense, which may obscure small masses.
FINDINGS: There are no findings suspicious for malignancy.
IMPRESSION: No mammographic evidence of malignancy. A result letter of this
screening mammogram will be mailed directly to the patient.

RECOMMENDATION:
Screening mammogram in one year. (Code:[id number]-BC3)

BI-RADS CATEGORY  1: Negative.

## 2023-12-23 ENCOUNTER — Ambulatory Visit
Admission: RE | Admit: 2023-12-23 | Discharge: 2023-12-23 | Disposition: A | Discharge status: Home / Self Care | Payer: 59 | Source: Ambulatory Visit | Attending: Internal Medicine | Admitting: Internal Medicine

## 2023-12-23 DIAGNOSIS — Z1231 Encounter for screening mammogram for malignant neoplasm of breast: Secondary | ICD-10-CM | POA: Diagnosis present

## 2023-12-24 ENCOUNTER — Other Ambulatory Visit: Payer: Self-pay

## 2023-12-24 ENCOUNTER — Encounter: Payer: Self-pay | Admitting: Internal Medicine

## 2023-12-24 DIAGNOSIS — G43C1 Periodic headache syndromes in child or adult, intractable: Secondary | ICD-10-CM

## 2023-12-24 MED ORDER — SUMATRIPTAN SUCCINATE 50 MG PO TABS: ORAL_TABLET | ORAL | 1 refills | Status: AC

## 2023-12-26 ENCOUNTER — Encounter: Payer: Self-pay | Admitting: Internal Medicine

## 2024-01-14 ENCOUNTER — Ambulatory Visit (INDEPENDENT_AMBULATORY_CARE_PROVIDER_SITE_OTHER): Admitting: Internal Medicine

## 2024-01-14 ENCOUNTER — Encounter: Payer: Self-pay | Admitting: Internal Medicine

## 2024-01-14 VITALS — BP 108/62 | Ht 69.0 in | Wt 133.0 lb

## 2024-01-14 DIAGNOSIS — B029 Zoster without complications: Secondary | ICD-10-CM

## 2024-01-14 NOTE — Progress Notes (Signed)
 Subjective:    Patient ID: Erika Hurley, female    DOB: 18-May-1966, 58 y.o.   MRN: [id number]  HPI  Discussed the use of AI scribe software for clinical note transcription with the patient, who gave verbal consent to proceed.  She presents with a rash on her buttocks.  She has had a rash on her buttocks for the past two weeks.  She has been applying alcohol, peroxide, and sperm to the rash, but it persists. She mentions a history of using tanning beds, which she initially thought might be related to the rash. She recalls a previous episode with a similar rash that was associated with a burning sensation and a 'hard line'. She is concerned about the rash spreading, but notes that she works from home, which limits her contact with others. No pain from the rash is reported.  She has been experiencing headaches for the last two weeks, which she associates with the rash. The headaches are described as severe and are accompanied by nausea. She notes that the rash itself is not painful.       Review of Systems   Past Medical History:  Diagnosis Date   Arthritis    Cervical dysplasia    History of abnormal cervical Pap smear    HGSIL   Migraines    Solitary cyst of breast    Stroke [facility])     Current Outpatient Medications  Medication Sig Dispense Refill   ALPRAZolam (XANAX) 0.25 MG tablet Take 1 tablet (0.25 mg total) by mouth 2 (two) times daily as needed for anxiety. 20 tablet 0   aspirin EC 81 MG tablet Take 1 tablet (81 mg total) by mouth daily. Swallow whole.     busPIRone (BUSPAR) 5 MG tablet Take 1 tablet (5 mg total) by mouth 3 (three) times daily. 270 tablet 1   Melatonin 3 MG TABS Take 30 mg by mouth.     methocarbamol (ROBAXIN) 500 MG tablet Take 1 tablet (500 mg total) by mouth every 8 (eight) hours as needed. 30 tablet 0   metoprolol succinate (TOPROL-XL) 25 MG 24 hr tablet Take 1 tablet (25 mg total) by mouth daily as needed. 90 tablet 0   rosuvastatin (CRESTOR) 5  MG tablet Take 1 tablet (5 mg total) by mouth daily. (Patient not taking: Reported on 10/15/2023) 90 tablet 3   SUMAtriptan (IMITREX) 50 MG tablet TAKE 1 TABLET BY MOUTH AT ONSET OF MIGRAINE HEADACHE.  MAY REPEAT IN 2 HOURS IF HEADACHE PERSISTS OR RECURS 10 tablet 1   traZODone (DESYREL) 50 MG tablet Take 1.5-2 tablets (75-100 mg total) by mouth at bedtime as needed for sleep. 180 tablet 1   triamcinolone cream (KENALOG) 0.1 % Apply topically bid prn for itchy areas at elbows for dermatitis. Avoid applying to face, groin, and axilla. Use as directed. 30 g 1   No current facility-administered medications for this visit.    Allergies  Allergen Reactions   Sulfa Antibiotics Anaphylaxis    Family History  Problem Relation Age of Onset   Lung cancer Mother    Hypertension Mother    Hyperlipidemia Mother    Kidney disease Mother    Breast cancer Mother 92       twice   Stroke Father    Heart disease Father    Hypertension Father    Hyperlipidemia Father    Kidney disease Father    Dementia Father    Prostate cancer Maternal Grandfather    Dementia  Paternal Grandmother     Social History   Socioeconomic History   Marital status: Media planner    Spouse name: Not on file   Number of children: Not on file   Years of education: Not on file   Highest education level: Some college, no degree  Occupational History   Not on file  Tobacco Use   Smoking status: Never   Smokeless tobacco: Never  Vaping Use   Vaping status: Never Used  Substance and Sexual Activity   Alcohol use: Yes    Alcohol/week: 2.0 standard drinks of alcohol    Types: 2 Glasses of wine per week   Drug use: No   Sexual activity: Yes    Partners: Male    Birth control/protection: Post-menopausal  Other Topics Concern   Not on file  Social History Narrative   Divorced    Employed Doctor, hospital    Social Drivers of Health   Financial Resource Strain: Low Risk  (01/10/2024)   Overall Financial  Resource Strain (CARDIA)    Difficulty of Paying Living Expenses: Not hard at all  Food Insecurity: No Food Insecurity (01/10/2024)   Hunger Vital Sign    Worried About Running Out of Food in the Last Year: Never true    Ran Out of Food in the Last Year: Never true  Transportation Needs: No Transportation Needs (01/10/2024)   PRAPARE - Administrator, Civil Service (Medical): No    Lack of Transportation (Non-Medical): No  Physical Activity: Insufficiently Active (01/10/2024)   Exercise Vital Sign    Days of Exercise per Week: 3 days    Minutes of Exercise per Session: 30 min  Stress: Stress Concern Present (01/10/2024)   [employer] of Occupational Health - Occupational Stress Questionnaire    Feeling of Stress : Very much  Social Connections: Moderately Isolated (01/10/2024)   Social Connection and Isolation Panel [NHANES]    Frequency of Communication with Friends and Family: More than three times a week    Frequency of Social Gatherings with Friends and Family: Three times a week    Attends Religious Services: More than 4 times per year    Active Member of Clubs or Organizations: No    Attends Banker Meetings: Not on file    Marital Status: Divorced  Intimate Partner Violence: Patient Declined (10/15/2023)   Humiliation, Afraid, Rape, and Kick questionnaire    Fear of Current or Ex-Partner: Patient declined    Emotionally Abused: Patient declined    Physically Abused: Patient declined    Sexually Abused: Patient declined     Constitutional: Pt reports headache. Denies fever, malaise, fatigue, or abrupt weight changes.  HEENT: Denies eye pain, eye redness, ear pain, ringing in the ears, wax buildup, runny nose, nasal congestion, bloody nose, or sore throat. Respiratory: Denies difficulty breathing, shortness of breath, cough or sputum production.   Cardiovascular: Denies chest pain, chest tightness, palpitations or swelling in the hands or feet.   Gastrointestinal: Denies abdominal pain, bloating, constipation, diarrhea or blood in the stool.  GU: Denies urgency, frequency, pain with urination, burning sensation, blood in urine, odor or discharge. Musculoskeletal: Denies decrease in range of motion, difficulty with gait, muscle pain or joint pain and swelling.  Skin: Patient reports rash of right buttock.  Neurological: Denies dizziness, difficulty with memory, difficulty with speech or problems with balance and coordination.  Psych: Denies anxiety, depression, SI/HI.  No other specific complaints in a complete review of systems (  except as listed in HPI above).      Objective:   Physical Exam  BP 108/62 (BP Location: Left Arm, Patient Position: Sitting, Cuff Size: Normal)   Ht 5\' 9"  (1.753 m)   Wt 133 lb (60.3 kg)   BMI 19.64 kg/m   Wt Readings from Last 3 Encounters:  10/15/23 130 lb 6.4 oz (59.1 kg)  10/12/22 126 lb (57.2 kg)  05/22/22 127 lb (57.6 kg)    General: Appears her stated age, well developed, well nourished in NAD. Skin: Patch of scabbed lesions noted of right buttock. Cardiovascular: Normal rate and rhythm. S1,S2 noted.  No murmur, rubs or gallops noted.  Pulmonary/Chest: Normal effort and positive vesicular breath sounds. No respiratory distress. No wheezes, rales or ronchi noted.  Musculoskeletal: No difficulty with gait.  Neurological: Alert and oriented.    BMET    Component Value Date/Time   NA 140 10/15/2023 0901   K 4.4 10/15/2023 0901   CL 102 10/15/2023 0901   CO2 29 10/15/2023 0901   GLUCOSE 105 (H) 10/15/2023 0901   BUN 15 10/15/2023 0901   CREATININE 0.81 10/15/2023 0901   CALCIUM 9.7 10/15/2023 0901   GFRNONAA >60 02/03/2021 1836   GFRNONAA 71 09/12/2020 1005   GFRAA 83 09/12/2020 1005    Lipid Panel     Component Value Date/Time   CHOL 203 (H) 10/15/2023 0901   TRIG 115 10/15/2023 0901   HDL 57 10/15/2023 0901   CHOLHDL 3.6 10/15/2023 0901   VLDL 16.6 02/23/2016 0905    LDLCALC 123 (H) 10/15/2023 0901    CBC    Component Value Date/Time   WBC 6.1 10/15/2023 0901   RBC 4.13 10/15/2023 0901   HGB 13.6 10/15/2023 0901   HCT 40.3 10/15/2023 0901   PLT 296 10/15/2023 0901   MCV 97.6 10/15/2023 0901   MCH 32.9 10/15/2023 0901   MCHC 33.7 10/15/2023 0901   RDW 11.8 10/15/2023 0901   LYMPHSABS 1,926 09/12/2020 1005   MONOABS 0.4 09/15/2015 1028   EOSABS 29 09/12/2020 1005   BASOSABS 29 09/12/2020 1005    Hgb A1C Lab Results  Component Value Date   HGBA1C 5.8 (H) 10/15/2023            Assessment & Plan:   Assessment and Plan    Herpes Zoster (Shingles) Rash consistent with herpes zoster, resolving. Outside antiviral therapy window. Headaches and nausea noted, possibly related to shingles. Discussed shingles vaccination benefits and recurrence possibility. - Allow rash to resolve naturally. - Advise avoiding contact with immunocompromised individuals, infants, and pregnant women during active phase.    RTC in 3 months for follow-up of chronic conditions Nicki Reaper, NP

## 2024-01-14 NOTE — Patient Instructions (Signed)
 Shingles  Shingles, or herpes zoster, is an infection. It gives you a skin rash and blisters. These infected areas may hurt a lot. Shingles only happens if: You've had chickenpox. You've been given a shot called a vaccine to protect you from getting chickenpox. Shingles is rare in this case. What are the causes? Shingles is caused by a germ called the varicella-zoster virus. This is the same germ that causes chickenpox. After you're exposed to the germ, it stays in your body but is dormant. This means it isn't active. Shingles happens if the germ becomes active again. This can happen years after you're first exposed to the germ. What increases the risk? You may be more likely to get shingles if: You're older than 60 years of age. You're under a lot of stress. You have a weak immune system. The immune system is your body's defense system. It may be weak if: You have human immunodeficiency virus (HIV). You have acquired immunodeficiency syndrome (AIDS). You have cancer. You take medicines that weaken your immune system. These include organ transplant medicines. What are the signs or symptoms? The first symptoms of shingles may be itching, tingling, or pain. Your skin may feel like it's burning. A few days or weeks later, you'll get a rash. Here's what you can expect: The rash is likely to be on one side of your body. The rash may be shaped like a belt or a band. Over time, it will turn into blisters filled with fluid. The blisters will break open and change into scabs. The scabs will dry up in about 2-3 weeks. You may also have: A fever. Chills. A headache. Nausea. How is this diagnosed? Shingles is diagnosed with a skin exam. A sample called a culture may be taken from one of your blisters and sent to a lab. This will show if you have shingles. How is this treated? The rash may last for several weeks. There's no cure for shingles, but your health care provider may give you medicines.  These medicines may: Help with pain. Help with itching. Help with irritation and swelling. Help you get better sooner. Help to prevent long-term problems. If the rash is on your face, you may need to see an eye doctor or an ear, nose, and throat (ENT) doctor. Follow these instructions at home: Medicines Take your medicines only as told by your provider. Put an anti-itch cream or numbing cream on the rash or blisters as told by your provider. Relieving itching and discomfort  To help with itching: Put cold, wet cloths called cold compresses on the rash or blisters. Take a cool bath. Try adding baking soda or dry oatmeal to the water. Do not bathe in hot water. Use calamine lotion on the rash or blisters. You can get this type of lotion at the store. Blister and rash care Keep your rash covered with a loose bandage. Wear loose clothes that don't rub on your rash. Take care of your rash as told by your provider. Make sure you: Wash your hands with soap and water for at least 20 seconds before and after you change your bandage. If you can't use soap and water, use hand sanitizer. Keep your rash and blisters clean by washing them with mild soap and cool water. Change your bandage. Check your rash every day for signs of infection. Check for: More redness, swelling, or pain. Fluid or blood. Warmth. Pus or a bad smell. Do not scratch your rash. Do not pick at your  blisters. To help you not scratch: Keep your fingernails clean and cut short. Try to wear gloves or mittens when you sleep. General instructions Rest. Wash your hands often with soap and water for at least 20 seconds. If you can't use soap and water, use hand sanitizer. Washing your hands lowers your chance of getting a skin infection. Your infection can cause chickenpox in others. If you have blisters that aren't scabs yet, stay away from: Babies. Pregnant people. Children who have eczema. Older people who have organ  transplants. People who have a long-term, or chronic, illness. Anyone who hasn't had chickenpox before. Anyone who hasn't gotten the chickenpox vaccine. How is this prevented? Vaccines are the best way to prevent you from getting chickenpox or shingles. Talk with your provider about getting these shots. Where to find more information Centers for Disease Control and Prevention (CDC): TonerPromos.no Contact a health care provider if: Your pain doesn't get better with medicine. Your pain doesn't get better after the rash heals. You have any signs of infection around the rash. Your rash or blisters get worse. You have a fever or chills. Get help right away if: The rash is on your face or nose. You have pain in your face or by your eye. You lose feeling on one side of your face. You have trouble seeing. You have ear pain or ringing in your ear. This information is not intended to replace advice given to you by your health care provider. Make sure you discuss any questions you have with your health care provider. Document Revised: 07/25/2023 Document Reviewed: 12/07/2022 Elsevier Patient Education  2024 [employer].

## 2024-01-16 ENCOUNTER — Encounter: Payer: Self-pay | Admitting: Internal Medicine

## 2024-01-16 ENCOUNTER — Telehealth (INDEPENDENT_AMBULATORY_CARE_PROVIDER_SITE_OTHER): Admitting: Internal Medicine

## 2024-01-16 DIAGNOSIS — L308 Other specified dermatitis: Secondary | ICD-10-CM | POA: Diagnosis not present

## 2024-01-16 NOTE — Patient Instructions (Signed)
 Itchy, Irritated Skin (Eczema): What to Know Eczema is a group of skin conditions that cause rough and inflamed skin. There are different types of eczema. They each have different triggers, symptoms, and treatments. Eczema is not contagious, so it doesn't spread from person to person. It can appear on different parts of your body at different times. It doesn't look the same on everyone. What are the causes? The exact cause of eczema isn't known. Things that can make it worse includes: Irritants. Allergens. What are the signs or symptoms? Symptoms depend on the type of eczema you have. They can range from mild to very bad. Symptoms often include: Itchiness. Dry skin. Rash or skin bumps. Swelling. Crusty, flaky, or scaly patches. Thick patches of skin. Oozing skin or blisters. How is this diagnosed? This condition may be diagnosed based on: Symptoms. Physical exam. Medical history. Skin patch tests that use allergen patches on your back to check for allergic reactions. You may need to see a skin specialist called a dermatologist. This specialist can help diagnose and treat this condition. How is this treated? There's no cure for eczema, but you can manage your symptoms. Treatment depends on the type of eczema you have. Options may include: Medicines to lessen itching (antihistamines). Medicine to put on your skin to lessen swelling and irritation (corticosteroid creams or ointments). Light therapy, also called phototherapy. The affected skin is put under ultraviolet (UV) light. Medicines may be prescribed or purchased at the store. This will depend on the strength that's needed. Follow these instructions at home: Skin Care Use skin creams or lotions as told. Do not scratch your skin. This can make your rash worse. Keep fingernails short to avoid scratching open the skin. General instructions Take or apply your medicines as told. Avoid triggers and allergens. Treat symptoms fast if  you have a flare-up. Keep all follow-up visits to make sure your treatment plan is working. Where to find more information American Academy of Dermatology: MarketingSheets.si National Eczema Association: nationaleczema.org The Society for Pediatric Dermatology: pedsderm.net Contact a health care provider if: You have very bad itching even with treatment. You often scratch your skin until it bleeds. Your rash looks different than normal. You have a fever. You have symptoms that don't go away with treatment. You have more redness, pain, or swelling over the affected skin. You have warmth or pus coming from the affected skin. This information is not intended to replace advice given to you by your health care provider. Make sure you discuss any questions you have with your health care provider. Document Revised: 03/26/2023 Document Reviewed: 03/26/2023 Elsevier Patient Education  2024 [employer].

## 2024-01-16 NOTE — Progress Notes (Signed)
 Virtual Visit via Video Note  I connected with Erika Hurley on 01/16/24 at  4:00 PM EDT by a video enabled telemedicine application and verified that I am speaking with the correct person using two identifiers.  Location: Patient: Home Provider: Office  Person's participating in this video call: Nicki Reaper, NP-C and Chavela Hatcher   I discussed the limitations of evaluation and management by telemedicine and the availability of in person appointments. The patient expressed understanding and agreed to proceed.  History of Present Illness:  Discussed the use of AI scribe software for clinical note transcription with the patient, who gave verbal consent to proceed.   The patient presents with an itchy, dry patch on the skin of the scalp and concerns about shingles.  She has an itchy, dry patch on her skin, which she initially thought might be related to a previous shingles outbreak. The patch is itchy, and she questions if it is another occurrence of shingles.   She has a history of shingles, diagnosed two days ago, which was already starting to dry up at that time. She is concerned about the potential for shingles to return or spread, especially given a family history where shingles affected the eye.  She has had both doses of her shingles vaccine.      Past Medical History:  Diagnosis Date   Arthritis    Cervical dysplasia    History of abnormal cervical Pap smear    HGSIL   Migraines    Solitary cyst of breast    Stroke [facility])     Current Outpatient Medications  Medication Sig Dispense Refill   ALPRAZolam (XANAX) 0.25 MG tablet Take 1 tablet (0.25 mg total) by mouth 2 (two) times daily as needed for anxiety. 20 tablet 0   aspirin EC 81 MG tablet Take 1 tablet (81 mg total) by mouth daily. Swallow whole.     busPIRone (BUSPAR) 5 MG tablet Take 1 tablet (5 mg total) by mouth 3 (three) times daily. 270 tablet 1   Melatonin 3 MG TABS Take 30 mg by mouth.     methocarbamol  (ROBAXIN) 500 MG tablet Take 1 tablet (500 mg total) by mouth every 8 (eight) hours as needed. (Patient not taking: Reported on 01/14/2024) 30 tablet 0   metoprolol succinate (TOPROL-XL) 25 MG 24 hr tablet Take 1 tablet (25 mg total) by mouth daily as needed. (Patient not taking: Reported on 01/14/2024) 90 tablet 0   rosuvastatin (CRESTOR) 5 MG tablet Take 1 tablet (5 mg total) by mouth daily. (Patient not taking: Reported on 01/14/2024) 90 tablet 3   SUMAtriptan (IMITREX) 50 MG tablet TAKE 1 TABLET BY MOUTH AT ONSET OF MIGRAINE HEADACHE.  MAY REPEAT IN 2 HOURS IF HEADACHE PERSISTS OR RECURS 10 tablet 1   traZODone (DESYREL) 50 MG tablet Take 1.5-2 tablets (75-100 mg total) by mouth at bedtime as needed for sleep. 180 tablet 1   triamcinolone cream (KENALOG) 0.1 % Apply topically bid prn for itchy areas at elbows for dermatitis. Avoid applying to face, groin, and axilla. Use as directed. 30 g 1   No current facility-administered medications for this visit.    Allergies  Allergen Reactions   Sulfa Antibiotics Anaphylaxis    Family History  Problem Relation Age of Onset   Lung cancer Mother    Hypertension Mother    Hyperlipidemia Mother    Kidney disease Mother    Breast cancer Mother 54       twice  Stroke Father    Heart disease Father    Hypertension Father    Hyperlipidemia Father    Kidney disease Father    Dementia Father    Prostate cancer Maternal Grandfather    Dementia Paternal Grandmother     Social History   Socioeconomic History   Marital status: Media planner    Spouse name: Not on file   Number of children: Not on file   Years of education: Not on file   Highest education level: Some college, no degree  Occupational History   Not on file  Tobacco Use   Smoking status: Never   Smokeless tobacco: Never  Vaping Use   Vaping status: Never Used  Substance and Sexual Activity   Alcohol use: Yes    Alcohol/week: 2.0 standard drinks of alcohol    Types: 2  Glasses of wine per week   Drug use: No   Sexual activity: Yes    Partners: Male    Birth control/protection: Post-menopausal  Other Topics Concern   Not on file  Social History Narrative   Divorced    Employed Doctor, hospital    Social Drivers of Health   Financial Resource Strain: Low Risk  (01/10/2024)   Overall Financial Resource Strain (CARDIA)    Difficulty of Paying Living Expenses: Not hard at all  Food Insecurity: No Food Insecurity (01/10/2024)   Hunger Vital Sign    Worried About Running Out of Food in the Last Year: Never true    Ran Out of Food in the Last Year: Never true  Transportation Needs: No Transportation Needs (01/10/2024)   PRAPARE - Administrator, Civil Service (Medical): No    Lack of Transportation (Non-Medical): No  Physical Activity: Insufficiently Active (01/10/2024)   Exercise Vital Sign    Days of Exercise per Week: 3 days    Minutes of Exercise per Session: 30 min  Stress: Stress Concern Present (01/10/2024)   [employer] of Occupational Health - Occupational Stress Questionnaire    Feeling of Stress : Very much  Social Connections: Moderately Isolated (01/10/2024)   Social Connection and Isolation Panel [NHANES]    Frequency of Communication with Friends and Family: More than three times a week    Frequency of Social Gatherings with Friends and Family: Three times a week    Attends Religious Services: More than 4 times per year    Active Member of Clubs or Organizations: No    Attends Banker Meetings: Not on file    Marital Status: Divorced  Intimate Partner Violence: Patient Declined (10/15/2023)   Humiliation, Afraid, Rape, and Kick questionnaire    Fear of Current or Ex-Partner: Patient declined    Emotionally Abused: Patient declined    Physically Abused: Patient declined    Sexually Abused: Patient declined     Constitutional: Denies fever, malaise, fatigue, headache or abrupt weight changes.  HEENT:  Denies eye pain, eye redness, ear pain, ringing in the ears, wax buildup, runny nose, nasal congestion, bloody nose, or sore throat. Respiratory: Denies difficulty breathing, shortness of breath, cough or sputum production.   Cardiovascular: Denies chest pain, chest tightness, palpitations or swelling in the hands or feet.  Skin: Patient reports rash of scalp.  Denies ulcercations.   No other specific complaints in a complete review of systems (except as listed in HPI above).  Observations/Objective:  There were no vitals taken for this visit. Wt Readings from Last 3 Encounters:  01/14/24 133 lb (  60.3 kg)  10/15/23 130 lb 6.4 oz (59.1 kg)  10/12/22 126 lb (57.2 kg)    General: Appears her stated age, well developed, well nourished in NAD. Skin: 1 cm slightly raised scaly patch noted on the right scalp at the hairline. Pulmonary/Chest: Normal effort. No respiratory distress.  Neurological: Alert and oriented.   BMET    Component Value Date/Time   NA 140 10/15/2023 0901   K 4.4 10/15/2023 0901   CL 102 10/15/2023 0901   CO2 29 10/15/2023 0901   GLUCOSE 105 (H) 10/15/2023 0901   BUN 15 10/15/2023 0901   CREATININE 0.81 10/15/2023 0901   CALCIUM 9.7 10/15/2023 0901   GFRNONAA >60 02/03/2021 1836   GFRNONAA 71 09/12/2020 1005   GFRAA 83 09/12/2020 1005    Lipid Panel     Component Value Date/Time   CHOL 203 (H) 10/15/2023 0901   TRIG 115 10/15/2023 0901   HDL 57 10/15/2023 0901   CHOLHDL 3.6 10/15/2023 0901   VLDL 16.6 02/23/2016 0905   LDLCALC 123 (H) 10/15/2023 0901    CBC    Component Value Date/Time   WBC 6.1 10/15/2023 0901   RBC 4.13 10/15/2023 0901   HGB 13.6 10/15/2023 0901   HCT 40.3 10/15/2023 0901   PLT 296 10/15/2023 0901   MCV 97.6 10/15/2023 0901   MCH 32.9 10/15/2023 0901   MCHC 33.7 10/15/2023 0901   RDW 11.8 10/15/2023 0901   LYMPHSABS 1,926 09/12/2020 1005   MONOABS 0.4 09/15/2015 1028   EOSABS 29 09/12/2020 1005   BASOSABS 29 09/12/2020  1005    Hgb A1C Lab Results  Component Value Date   HGBA1C 5.8 (H) 10/15/2023       Assessment and Plan:  Assessment and Plan    Eczema Lesion consistent with eczema, not shingles. Video assessment supports eczema diagnosis. - Apply triamcinolone cream to affected area twice daily.   Shingles Previously diagnosed, resolving. Stress and illness potential triggers. Discussed recurrence and stress management. - Monitor for new symptoms or recurrence.     RTC in 3 months for follow-up of chronic conditions  Follow Up Instructions:    I discussed the assessment and treatment plan with the patient. The patient was provided an opportunity to ask questions and all were answered. The patient agreed with the plan and demonstrated an understanding of the instructions.   The patient was advised to call back or seek an in-person evaluation if the symptoms worsen or if the condition fails to improve as anticipated.   Nicki Reaper, NP

## 2024-01-30 ENCOUNTER — Encounter: Payer: Self-pay | Admitting: Internal Medicine

## 2024-01-30 ENCOUNTER — Ambulatory Visit (INDEPENDENT_AMBULATORY_CARE_PROVIDER_SITE_OTHER): Admitting: Internal Medicine

## 2024-01-30 VITALS — BP 98/62 | Ht 69.0 in | Wt 129.4 lb

## 2024-01-30 DIAGNOSIS — M549 Dorsalgia, unspecified: Secondary | ICD-10-CM | POA: Diagnosis not present

## 2024-01-30 MED ORDER — TIZANIDINE HCL 2 MG PO CAPS: 2.0000 mg | ORAL_CAPSULE | Freq: Every evening | ORAL | 0 refills | Status: DC | PRN

## 2024-01-30 MED ORDER — KETOROLAC TROMETHAMINE 30 MG/ML IJ SOLN
30.0000 mg | Freq: Once | INTRAMUSCULAR | Status: AC
  Administered 2024-01-30: 30 mg via INTRAMUSCULAR

## 2024-01-30 MED ORDER — MELOXICAM 15 MG PO TABS: 15.0000 mg | ORAL_TABLET | Freq: Every day | ORAL | 0 refills | Status: DC

## 2024-01-30 NOTE — Progress Notes (Signed)
 Subjective:    Patient ID: Erika Hurley, female    DOB: 1966-09-12, 58 y.o.   MRN: [id number]  HPI   Discussed the use of AI scribe software for clinical note transcription with the patient, who gave verbal consent to proceed.  Erika Hurley "Drue Flirt" is a 58 year old female who presents with back pain between the shoulder blades.  She experiences severe back pain located between the shoulder blades, which began suddenly in the middle of the night after going to bed on Tuesday. The pain is described as a 'knot' and worsens with movement, making daily activities such as showering difficult. She is unable to raise her head or lean backward to rinse shampoo from her hair due to the pain.  She has used a heating pad to alleviate the pain. She recalls being prescribed methocarbamol in December but did not take it for the same reason. She has not used over-the-counter medications like tylenol or ibuprofen for this episode. She mentions a past prescription of meloxicam, an anti-inflammatory, but has run out of it. She inquires about taking both meloxicam and a muscle relaxer simultaneously.  No numbness, tingling, or pain radiating to the upper extremities. No new headaches or dizziness, stating that her current headache is not out of the ordinary. No cough or shortness of breath.      Review of Systems   Past Medical History:  Diagnosis Date   Arthritis    Cervical dysplasia    History of abnormal cervical Pap smear    HGSIL   Migraines    Solitary cyst of breast    Stroke [facility])     Current Outpatient Medications  Medication Sig Dispense Refill   ALPRAZolam (XANAX) 0.25 MG tablet Take 1 tablet (0.25 mg total) by mouth 2 (two) times daily as needed for anxiety. 20 tablet 0   aspirin EC 81 MG tablet Take 1 tablet (81 mg total) by mouth daily. Swallow whole.     busPIRone (BUSPAR) 5 MG tablet Take 1 tablet (5 mg total) by mouth 3 (three) times daily. 270 tablet 1   Melatonin 3 MG  TABS Take 30 mg by mouth.     rosuvastatin (CRESTOR) 5 MG tablet Take 1 tablet (5 mg total) by mouth daily. (Patient not taking: Reported on 01/14/2024) 90 tablet 3   SUMAtriptan (IMITREX) 50 MG tablet TAKE 1 TABLET BY MOUTH AT ONSET OF MIGRAINE HEADACHE.  MAY REPEAT IN 2 HOURS IF HEADACHE PERSISTS OR RECURS 10 tablet 1   traZODone (DESYREL) 50 MG tablet Take 1.5-2 tablets (75-100 mg total) by mouth at bedtime as needed for sleep. 180 tablet 1   triamcinolone cream (KENALOG) 0.1 % Apply topically bid prn for itchy areas at elbows for dermatitis. Avoid applying to face, groin, and axilla. Use as directed. 30 g 1   No current facility-administered medications for this visit.    Allergies  Allergen Reactions   Sulfa Antibiotics Anaphylaxis    Family History  Problem Relation Age of Onset   Lung cancer Mother    Hypertension Mother    Hyperlipidemia Mother    Kidney disease Mother    Breast cancer Mother 4       twice   Stroke Father    Heart disease Father    Hypertension Father    Hyperlipidemia Father    Kidney disease Father    Dementia Father    Prostate cancer Maternal Grandfather    Dementia Paternal Grandmother  Social History   Socioeconomic History   Marital status: Media planner    Spouse name: Not on file   Number of children: Not on file   Years of education: Not on file   Highest education level: Some college, no degree  Occupational History   Not on file  Tobacco Use   Smoking status: Never   Smokeless tobacco: Never  Vaping Use   Vaping status: Never Used  Substance and Sexual Activity   Alcohol use: Yes    Alcohol/week: 2.0 standard drinks of alcohol    Types: 2 Glasses of wine per week   Drug use: No   Sexual activity: Yes    Partners: Male    Birth control/protection: Post-menopausal  Other Topics Concern   Not on file  Social History Narrative   Divorced    Employed Doctor, hospital    Social Drivers of Health   Financial  Resource Strain: Low Risk  (01/10/2024)   Overall Financial Resource Strain (CARDIA)    Difficulty of Paying Living Expenses: Not hard at all  Food Insecurity: No Food Insecurity (01/10/2024)   Hunger Vital Sign    Worried About Running Out of Food in the Last Year: Never true    Ran Out of Food in the Last Year: Never true  Transportation Needs: No Transportation Needs (01/10/2024)   PRAPARE - Administrator, Civil Service (Medical): No    Lack of Transportation (Non-Medical): No  Physical Activity: Insufficiently Active (01/10/2024)   Exercise Vital Sign    Days of Exercise per Week: 3 days    Minutes of Exercise per Session: 30 min  Stress: Stress Concern Present (01/10/2024)   [employer] of Occupational Health - Occupational Stress Questionnaire    Feeling of Stress : Very much  Social Connections: Moderately Isolated (01/10/2024)   Social Connection and Isolation Panel [NHANES]    Frequency of Communication with Friends and Family: More than three times a week    Frequency of Social Gatherings with Friends and Family: Three times a week    Attends Religious Services: More than 4 times per year    Active Member of Clubs or Organizations: No    Attends Banker Meetings: Not on file    Marital Status: Divorced  Intimate Partner Violence: Patient Declined (10/15/2023)   Humiliation, Afraid, Rape, and Kick questionnaire    Fear of Current or Ex-Partner: Patient declined    Emotionally Abused: Patient declined    Physically Abused: Patient declined    Sexually Abused: Patient declined     Constitutional: Denies fever, malaise, fatigue, headache or abrupt weight changes.  HEENT: Denies eye pain, eye redness, ear pain, ringing in the ears, wax buildup, runny nose, nasal congestion, bloody nose, or sore throat. Respiratory: Denies difficulty breathing, shortness of breath, cough or sputum production.   Cardiovascular: Denies chest pain, chest tightness,  palpitations or swelling in the hands or feet.  Musculoskeletal: Patient reports back pain.  Denies decrease in range of motion, difficulty with gait, or joint swelling.  Skin: Denies redness, rashes, lesions or ulcercations.  Neurological: Patient reports insomnia.  Denies dizziness, difficulty with memory, difficulty with speech or problems with balance and coordination.    No other specific complaints in a complete review of systems (except as listed in HPI above).      Objective:   Physical Exam BP 98/62 (BP Location: Left Arm, Patient Position: Sitting, Cuff Size: Normal)   Ht 5\' 9"  (1.753 m)  Wt 129 lb 6.4 oz (58.7 kg)   BMI 19.11 kg/m   Wt Readings from Last 3 Encounters:  01/14/24 133 lb (60.3 kg)  10/15/23 130 lb 6.4 oz (59.1 kg)  10/12/22 126 lb (57.2 kg)    General: Appears her stated age, well developed, well nourished in NAD. Skin: Warm, dry and intact. No rashes noted. HEENT: Head: normal shape and size; Eyes: sclera white, no icterus, conjunctiva pink, PERRLA and EOMs intact;  Cardiovascular: Normal rate and rhythm. S1,S2 noted.  No murmur, rubs or gallops noted.  Pulmonary/Chest: Normal effort and positive vesicular breath sounds. No respiratory distress. No wheezes, rales or ronchi noted.  Musculoskeletal: Normal flexion, extension and rotation of the spine.  No bony tenderness noted over the spine.  Pain with palpation in the left subscapular area.  Strength 5/5 BUE.  No difficulty with gait.  Neurological: Alert and oriented.  Coordination normal.    BMET    Component Value Date/Time   NA 140 10/15/2023 0901   K 4.4 10/15/2023 0901   CL 102 10/15/2023 0901   CO2 29 10/15/2023 0901   GLUCOSE 105 (H) 10/15/2023 0901   BUN 15 10/15/2023 0901   CREATININE 0.81 10/15/2023 0901   CALCIUM 9.7 10/15/2023 0901   GFRNONAA >60 02/03/2021 1836   GFRNONAA 71 09/12/2020 1005   GFRAA 83 09/12/2020 1005    Lipid Panel     Component Value Date/Time   CHOL 203  (H) 10/15/2023 0901   TRIG 115 10/15/2023 0901   HDL 57 10/15/2023 0901   CHOLHDL 3.6 10/15/2023 0901   VLDL 16.6 02/23/2016 0905   LDLCALC 123 (H) 10/15/2023 0901    CBC    Component Value Date/Time   WBC 6.1 10/15/2023 0901   RBC 4.13 10/15/2023 0901   HGB 13.6 10/15/2023 0901   HCT 40.3 10/15/2023 0901   PLT 296 10/15/2023 0901   MCV 97.6 10/15/2023 0901   MCH 32.9 10/15/2023 0901   MCHC 33.7 10/15/2023 0901   RDW 11.8 10/15/2023 0901   LYMPHSABS 1,926 09/12/2020 1005   MONOABS 0.4 09/15/2015 1028   EOSABS 29 09/12/2020 1005   BASOSABS 29 09/12/2020 1005    Hgb A1C Lab Results  Component Value Date   HGBA1C 5.8 (H) 10/15/2023            Assessment & Plan:  Assessment and Plan    Musculoskeletal back pain Acute left subscapular musculoskeletal pain, likely muscle strain or spasm. No respiratory or neurological involvement. Musculoskeletal origin confirmed by lack of tenderness on palpation and normal lung sounds. - Administer 30 mg Toradol IM for pain relief. - Prescribe tizanidine 2 mg at bedtime for muscle relaxation.  Sedation caution given - Refill meloxicam 15 mg for anti-inflammatory effect.  -Encouraged heat and massage       RTC in 3 months for follow-up of chronic conditions Nicki Reaper, NP

## 2024-01-30 NOTE — Patient Instructions (Signed)

## 2024-03-05 ENCOUNTER — Encounter: Payer: Self-pay | Admitting: Internal Medicine

## 2024-03-06 ENCOUNTER — Encounter: Payer: Self-pay | Admitting: Internal Medicine

## 2024-03-06 ENCOUNTER — Ambulatory Visit (INDEPENDENT_AMBULATORY_CARE_PROVIDER_SITE_OTHER): Admitting: Internal Medicine

## 2024-03-06 VITALS — BP 102/64 | Ht 69.0 in | Wt 128.8 lb

## 2024-03-06 DIAGNOSIS — B029 Zoster without complications: Secondary | ICD-10-CM

## 2024-03-06 MED ORDER — VALACYCLOVIR HCL 1 G PO TABS: 1000.0000 mg | ORAL_TABLET | Freq: Three times a day (TID) | ORAL | 0 refills | Status: DC

## 2024-03-06 MED ORDER — GABAPENTIN 100 MG PO CAPS: 100.0000 mg | ORAL_CAPSULE | Freq: Three times a day (TID) | ORAL | 0 refills | Status: DC

## 2024-03-06 NOTE — Patient Instructions (Signed)
 Shingles  Shingles, or herpes zoster, is an infection. It gives you a skin rash and blisters. These infected areas may hurt a lot. Shingles only happens if: You've had chickenpox. You've been given a shot called a vaccine to protect you from getting chickenpox. Shingles is rare in this case. What are the causes? Shingles is caused by a germ called the varicella-zoster virus. This is the same germ that causes chickenpox. After you're exposed to the germ, it stays in your body but is dormant. This means it isn't active. Shingles happens if the germ becomes active again. This can happen years after you're first exposed to the germ. What increases the risk? You may be more likely to get shingles if: You're older than 60 years of age. You're under a lot of stress. You have a weak immune system. The immune system is your body's defense system. It may be weak if: You have human immunodeficiency virus (HIV). You have acquired immunodeficiency syndrome (AIDS). You have cancer. You take medicines that weaken your immune system. These include organ transplant medicines. What are the signs or symptoms? The first symptoms of shingles may be itching, tingling, or pain. Your skin may feel like it's burning. A few days or weeks later, you'll get a rash. Here's what you can expect: The rash is likely to be on one side of your body. The rash may be shaped like a belt or a band. Over time, it will turn into blisters filled with fluid. The blisters will break open and change into scabs. The scabs will dry up in about 2-3 weeks. You may also have: A fever. Chills. A headache. Nausea. How is this diagnosed? Shingles is diagnosed with a skin exam. A sample called a culture may be taken from one of your blisters and sent to a lab. This will show if you have shingles. How is this treated? The rash may last for several weeks. There's no cure for shingles, but your health care provider may give you medicines.  These medicines may: Help with pain. Help with itching. Help with irritation and swelling. Help you get better sooner. Help to prevent long-term problems. If the rash is on your face, you may need to see an eye doctor or an ear, nose, and throat (ENT) doctor. Follow these instructions at home: Medicines Take your medicines only as told by your provider. Put an anti-itch cream or numbing cream on the rash or blisters as told by your provider. Relieving itching and discomfort  To help with itching: Put cold, wet cloths called cold compresses on the rash or blisters. Take a cool bath. Try adding baking soda or dry oatmeal to the water. Do not bathe in hot water. Use calamine lotion on the rash or blisters. You can get this type of lotion at the store. Blister and rash care Keep your rash covered with a loose bandage. Wear loose clothes that don't rub on your rash. Take care of your rash as told by your provider. Make sure you: Wash your hands with soap and water for at least 20 seconds before and after you change your bandage. If you can't use soap and water, use hand sanitizer. Keep your rash and blisters clean by washing them with mild soap and cool water. Change your bandage. Check your rash every day for signs of infection. Check for: More redness, swelling, or pain. Fluid or blood. Warmth. Pus or a bad smell. Do not scratch your rash. Do not pick at your  blisters. To help you not scratch: Keep your fingernails clean and cut short. Try to wear gloves or mittens when you sleep. General instructions Rest. Wash your hands often with soap and water for at least 20 seconds. If you can't use soap and water, use hand sanitizer. Washing your hands lowers your chance of getting a skin infection. Your infection can cause chickenpox in others. If you have blisters that aren't scabs yet, stay away from: Babies. Pregnant people. Children who have eczema. Older people who have organ  transplants. People who have a long-term, or chronic, illness. Anyone who hasn't had chickenpox before. Anyone who hasn't gotten the chickenpox vaccine. How is this prevented? Vaccines are the best way to prevent you from getting chickenpox or shingles. Talk with your provider about getting these shots. Where to find more information Centers for Disease Control and Prevention (CDC): TonerPromos.no Contact a health care provider if: Your pain doesn't get better with medicine. Your pain doesn't get better after the rash heals. You have any signs of infection around the rash. Your rash or blisters get worse. You have a fever or chills. Get help right away if: The rash is on your face or nose. You have pain in your face or by your eye. You lose feeling on one side of your face. You have trouble seeing. You have ear pain or ringing in your ear. This information is not intended to replace advice given to you by your health care provider. Make sure you discuss any questions you have with your health care provider. Document Revised: 07/25/2023 Document Reviewed: 12/07/2022 Elsevier Patient Education  2024 [employer].

## 2024-03-06 NOTE — Addendum Note (Signed)
 Addended by: Carollynn Cirri on: 03/06/2024 08:46 AM   Modules accepted: Level of Service

## 2024-03-06 NOTE — Progress Notes (Signed)
 Subjective:    Patient ID: Erika Hurley, female    DOB: 08-Sep-1966, 58 y.o.   MRN: [id number]  HPI     Discussed the use of AI scribe software for clinical note transcription with the patient, who gave verbal consent to proceed.  Erika Hurley "Erika Hurley" is a 58 year old female with a history of shingles who presents with a recurrent rash on her buttocks.  She has a recurrent rash on her buttocks that began three days ago. The rash is warm but not itchy, and she is concerned about it spreading to her rectum. She previously experienced shingles in March, which resolved approximately a week after onset without treatment due to delayed presentation.  She experiences headaches associated with the rash, which are not alleviated by her migraine medication.   She has received two shingles vaccinations in the past, which she recalls receiving at a [facility] during the early COVID-19 pandemic. She is unsure of the exact dates and plans to verify this information with the pharmacy.        Review of Systems   Past Medical History:  Diagnosis Date   Arthritis    Cervical dysplasia    History of abnormal cervical Pap smear    HGSIL   Migraines    Solitary cyst of breast    Stroke [facility])     Current Outpatient Medications  Medication Sig Dispense Refill   ALPRAZolam  (XANAX ) 0.25 MG tablet Take 1 tablet (0.25 mg total) by mouth 2 (two) times daily as needed for anxiety. 20 tablet 0   aspirin  EC 81 MG tablet Take 1 tablet (81 mg total) by mouth daily. Swallow whole.     busPIRone  (BUSPAR ) 5 MG tablet Take 1 tablet (5 mg total) by mouth 3 (three) times daily. 270 tablet 1   Melatonin 3 MG TABS Take 30 mg by mouth.     meloxicam  (MOBIC ) 15 MG tablet Take 1 tablet (15 mg total) by mouth daily. 30 tablet 0   rosuvastatin  (CRESTOR ) 5 MG tablet Take 1 tablet (5 mg total) by mouth daily. (Patient not taking: Reported on 10/15/2023) 90 tablet 3   SUMAtriptan  (IMITREX ) 50 MG tablet TAKE 1  TABLET BY MOUTH AT ONSET OF MIGRAINE HEADACHE.  MAY REPEAT IN 2 HOURS IF HEADACHE PERSISTS OR RECURS 10 tablet 1   tizanidine  (ZANAFLEX ) 2 MG capsule Take 1 capsule (2 mg total) by mouth at bedtime as needed for muscle spasms. 20 capsule 0   traZODone  (DESYREL ) 50 MG tablet Take 1.5-2 tablets (75-100 mg total) by mouth at bedtime as needed for sleep. 180 tablet 1   triamcinolone  cream (KENALOG ) 0.1 % Apply topically bid prn for itchy areas at elbows for dermatitis. Avoid applying to face, groin, and axilla. Use as directed. 30 g 1   No current facility-administered medications for this visit.    Allergies  Allergen Reactions   Sulfa Antibiotics Anaphylaxis    Family History  Problem Relation Age of Onset   Lung cancer Mother    Hypertension Mother    Hyperlipidemia Mother    Kidney disease Mother    Breast cancer Mother 35       twice   Stroke Father    Heart disease Father    Hypertension Father    Hyperlipidemia Father    Kidney disease Father    Dementia Father    Prostate cancer Maternal Grandfather    Dementia Paternal Grandmother     Social History  Socioeconomic History   Marital status: Media planner    Spouse name: Not on file   Number of children: Not on file   Years of education: Not on file   Highest education level: Some college, no degree  Occupational History   Not on file  Tobacco Use   Smoking status: Never   Smokeless tobacco: Never  Vaping Use   Vaping status: Never Used  Substance and Sexual Activity   Alcohol use: Yes    Alcohol/week: 2.0 standard drinks of alcohol    Types: 2 Glasses of wine per week   Drug use: No   Sexual activity: Yes    Partners: Male    Birth control/protection: Post-menopausal  Other Topics Concern   Not on file  Social History Narrative   Divorced    Employed Doctor, hospital    Social Drivers of Health   Financial Resource Strain: Low Risk  (01/10/2024)   Overall Financial Resource Strain (CARDIA)     Difficulty of Paying Living Expenses: Not hard at all  Food Insecurity: No Food Insecurity (01/10/2024)   Hunger Vital Sign    Worried About Running Out of Food in the Last Year: Never true    Ran Out of Food in the Last Year: Never true  Transportation Needs: No Transportation Needs (01/10/2024)   PRAPARE - Administrator, Civil Service (Medical): No    Lack of Transportation (Non-Medical): No  Physical Activity: Insufficiently Active (01/10/2024)   Exercise Vital Sign    Days of Exercise per Week: 3 days    Minutes of Exercise per Session: 30 min  Stress: Stress Concern Present (01/10/2024)   [employer] of Occupational Health - Occupational Stress Questionnaire    Feeling of Stress : Very much  Social Connections: Moderately Isolated (01/10/2024)   Social Connection and Isolation Panel [NHANES]    Frequency of Communication with Friends and Family: More than three times a week    Frequency of Social Gatherings with Friends and Family: Three times a week    Attends Religious Services: More than 4 times per year    Active Member of Clubs or Organizations: No    Attends Banker Meetings: Not on file    Marital Status: Divorced  Intimate Partner Violence: Patient Declined (10/15/2023)   Humiliation, Afraid, Rape, and Kick questionnaire    Fear of Current or Ex-Partner: Patient declined    Emotionally Abused: Patient declined    Physically Abused: Patient declined    Sexually Abused: Patient declined     Constitutional: Pt reports headache. Denies fever, malaise, fatigue, or abrupt weight changes.  Respiratory: Denies difficulty breathing, shortness of breath, cough or sputum production.   Cardiovascular: Denies chest pain, chest tightness, palpitations or swelling in the hands or feet.  Skin: Patient reports rash of right buttock.  Neurological: Denies dizziness, difficulty with memory, difficulty with speech or problems with balance and coordination.    No  other specific complaints in a complete review of systems (except as listed in HPI above).      Objective:   Physical Exam BP 102/64 (BP Location: Left Arm, Patient Position: Sitting, Cuff Size: Normal)   Ht 5\' 9"  (1.753 m)   Wt 128 lb 12.8 oz (58.4 kg)   BMI 19.02 kg/m    Wt Readings from Last 3 Encounters:  01/30/24 129 lb 6.4 oz (58.7 kg)  01/14/24 133 lb (60.3 kg)  10/15/23 130 lb 6.4 oz (59.1 kg)  General: Appears her stated age, well developed, well nourished in NAD. Skin: Grouped vesicular lesions on erythematous base noted of right buttock. Cardiovascular: Normal rate.  Pulmonary/Chest: Normal effort.  Neurological: Alert and oriented.    BMET    Component Value Date/Time   NA 140 10/15/2023 0901   K 4.4 10/15/2023 0901   CL 102 10/15/2023 0901   CO2 29 10/15/2023 0901   GLUCOSE 105 (H) 10/15/2023 0901   BUN 15 10/15/2023 0901   CREATININE 0.81 10/15/2023 0901   CALCIUM  9.7 10/15/2023 0901   GFRNONAA >60 02/03/2021 1836   GFRNONAA 71 09/12/2020 1005   GFRAA 83 09/12/2020 1005    Lipid Panel     Component Value Date/Time   CHOL 203 (H) 10/15/2023 0901   TRIG 115 10/15/2023 0901   HDL 57 10/15/2023 0901   CHOLHDL 3.6 10/15/2023 0901   VLDL 16.6 02/23/2016 0905   LDLCALC 123 (H) 10/15/2023 0901    CBC    Component Value Date/Time   WBC 6.1 10/15/2023 0901   RBC 4.13 10/15/2023 0901   HGB 13.6 10/15/2023 0901   HCT 40.3 10/15/2023 0901   PLT 296 10/15/2023 0901   MCV 97.6 10/15/2023 0901   MCH 32.9 10/15/2023 0901   MCHC 33.7 10/15/2023 0901   RDW 11.8 10/15/2023 0901   LYMPHSABS 1,926 09/12/2020 1005   MONOABS 0.4 09/15/2015 1028   EOSABS 29 09/12/2020 1005   BASOSABS 29 09/12/2020 1005    Hgb A1C Lab Results  Component Value Date   HGBA1C 5.8 (H) 10/15/2023            Assessment & Plan:   Assessment and Plan    Shingles Recurrent shingles with rash on buttocks. Discussed daily suppressive therapy if recurrences  persist. - Prescribe Valtrex 1 gram TID for 10 days. Crush tablets if needed. - Verify shingles vaccination status at [facility] and obtain printout. - Consider daily Valtrex 500 mg for suppression if frequent recurrences.  Headache associated with shingles Headaches not relieved by migraine medication. Discussed gabapentin  for nerve pain with drowsiness as a side effect. - Prescribe gabapentin  100 mg TID PRN for nerve pain and headaches.       RTC in 1 months for follow-up of chronic conditions Helayne Lo, NP

## 2024-04-15 ENCOUNTER — Ambulatory Visit: Payer: Self-pay | Admitting: Internal Medicine

## 2024-04-21 ENCOUNTER — Ambulatory Visit: Admitting: Internal Medicine

## 2024-04-21 NOTE — Progress Notes (Deleted)
 Subjective:    Patient ID: Erika Hurley, female    DOB: 16-May-1966, 58 y.o.   MRN: [id number]  HPI  Patient presents to clinic today for follow-up of chronic conditions.  Migraines: These occur once a week.  Triggered by hormones and seasonal changes.  She takes sumatriptan  as needed with good relief of symptoms.  She follows with neurology.  HLD with history of stroke: Her last LDL was 123, triglycerides 115, 10/2020.  She denies myalgias on rosuvastatin .  She is taking aspirin  as well.  She tries to consume a low-fat diet.  She follows with [facility] neurology.  Insomnia: She has difficulty falling and staying asleep.  She takes melatonin and trazodone  as prescribed with some relief of symptoms.  There is no sleep study on file.  Anxiety: Persistent.  She takes buspirone  xanax  as needed with some relief of symptoms.  She is not currently seeing a therapist.  She denies depression, SI/HI.  Prediabetes: Her last A1c was 5.8%, 10/2023.  She is not taking any oral diabetic medication at this time.  She does not check her sugars.  Osteopenia: She is not taking any calcium  or vitamin D  OTC.  Bone density from 12/2022 reviewed.  Review of Systems     Past Medical History:  Diagnosis Date   Arthritis    Cervical dysplasia    History of abnormal cervical Pap smear    HGSIL   Migraines    Solitary cyst of breast    Stroke [facility])     Current Outpatient Medications  Medication Sig Dispense Refill   ALPRAZolam  (XANAX ) 0.25 MG tablet Take 1 tablet (0.25 mg total) by mouth 2 (two) times daily as needed for anxiety. 20 tablet 0   aspirin  EC 81 MG tablet Take 1 tablet (81 mg total) by mouth daily. Swallow whole.     busPIRone  (BUSPAR ) 5 MG tablet Take 1 tablet (5 mg total) by mouth 3 (three) times daily. 270 tablet 1   gabapentin  (NEURONTIN ) 100 MG capsule Take 1 capsule (100 mg total) by mouth 3 (three) times daily. 30 capsule 0   Melatonin 3 MG TABS Take 30 mg by mouth.     rosuvastatin   (CRESTOR ) 5 MG tablet Take 1 tablet (5 mg total) by mouth daily. (Patient not taking: Reported on 03/06/2024) 90 tablet 3   SUMAtriptan  (IMITREX ) 50 MG tablet TAKE 1 TABLET BY MOUTH AT ONSET OF MIGRAINE HEADACHE.  MAY REPEAT IN 2 HOURS IF HEADACHE PERSISTS OR RECURS 10 tablet 1   traZODone  (DESYREL ) 50 MG tablet Take 1.5-2 tablets (75-100 mg total) by mouth at bedtime as needed for sleep. 180 tablet 1   triamcinolone  cream (KENALOG ) 0.1 % Apply topically bid prn for itchy areas at elbows for dermatitis. Avoid applying to face, groin, and axilla. Use as directed. 30 g 1   valACYclovir  (VALTREX ) 1000 MG tablet Take 1 tablet (1,000 mg total) by mouth 3 (three) times daily. For shingles 21 tablet 0   No current facility-administered medications for this visit.    Allergies  Allergen Reactions   Sulfa Antibiotics Anaphylaxis    Family History  Problem Relation Age of Onset   Lung cancer Mother    Hypertension Mother    Hyperlipidemia Mother    Kidney disease Mother    Breast cancer Mother 4       twice   Stroke Father    Heart disease Father    Hypertension Father    Hyperlipidemia Father  Kidney disease Father    Dementia Father    Prostate cancer Maternal Grandfather    Dementia Paternal Grandmother     Social History   Socioeconomic History   Marital status: Media planner    Spouse name: Not on file   Number of children: Not on file   Years of education: Not on file   Highest education level: Associate degree: occupational, Scientist, product/process development, or vocational program  Occupational History   Not on file  Tobacco Use   Smoking status: Never   Smokeless tobacco: Never  Vaping Use   Vaping status: Never Used  Substance and Sexual Activity   Alcohol use: Yes    Alcohol/week: 2.0 standard drinks of alcohol    Types: 2 Glasses of wine per week   Drug use: No   Sexual activity: Yes    Partners: Male    Birth control/protection: Post-menopausal  Other Topics Concern   Not on  file  Social History Narrative   Divorced    Employed Doctor, hospital    Social Drivers of Health   Financial Resource Strain: Low Risk  (04/17/2024)   Overall Financial Resource Strain (CARDIA)    Difficulty of Paying Living Expenses: Not hard at all  Food Insecurity: No Food Insecurity (04/17/2024)   Hunger Vital Sign    Worried About Running Out of Food in the Last Year: Never true    Ran Out of Food in the Last Year: Never true  Transportation Needs: No Transportation Needs (04/17/2024)   PRAPARE - Administrator, Civil Service (Medical): No    Lack of Transportation (Non-Medical): No  Physical Activity: Insufficiently Active (04/17/2024)   Exercise Vital Sign    Days of Exercise per Week: 1 day    Minutes of Exercise per Session: 60 min  Stress: Stress Concern Present (04/17/2024)   [employer] of Occupational Health - Occupational Stress Questionnaire    Feeling of Stress: Rather much  Social Connections: Unknown (04/17/2024)   Social Connection and Isolation Panel    Frequency of Communication with Friends and Family: More than three times a week    Frequency of Social Gatherings with Friends and Family: More than three times a week    Attends Religious Services: More than 4 times per year    Active Member of [employer] or Organizations: Not on file    Attends Banker Meetings: Not on file    Marital Status: Divorced  Intimate Partner Violence: Patient Declined (10/15/2023)   Humiliation, Afraid, Rape, and Kick questionnaire    Fear of Current or Ex-Partner: Patient declined    Emotionally Abused: Patient declined    Physically Abused: Patient declined    Sexually Abused: Patient declined     Constitutional: Patient reports intermittent headaches.  Denies fever, malaise, fatigue, or abrupt weight changes.  Respiratory: Denies difficulty breathing, shortness of breath, cough or sputum production.   Cardiovascular: Denies chest pain, chest  tightness, palpitations or swelling in the hands or feet.  Gastrointestinal: Denies abdominal pain, bloating, constipation, diarrhea or blood in the stool.  GU: Pt report decreased libido. Denies urgency, frequency, pain with urination, burning sensation, blood in urine, odor or discharge. Musculoskeletal: Denies decrease in range of motion, difficulty with gait, muscle pain or joint pain and swelling.  Skin: Denies redness, rashes, lesions or ulcercations.  Neurological: Patient reports insomnia.  Denies dizziness, difficulty with memory, difficulty with speech or problems with balance and coordination.  Psych: Patient has a history of  anxiety.  Denies depression, SI/HI.  No other specific complaints in a complete review of systems (except as listed in HPI above).  Objective:   Physical Exam  There were no vitals taken for this visit.  Wt Readings from Last 3 Encounters:  03/06/24 128 lb 12.8 oz (58.4 kg)  01/30/24 129 lb 6.4 oz (58.7 kg)  01/14/24 133 lb (60.3 kg)    General: Appears her stated age, well developed, well nourished in NAD. Skin: Warm, dry and intact.  HEENT: Head: normal shape and size; Eyes: sclera white, no icterus, conjunctiva pink, PERRLA and EOMs intact;  Cardiovascular: Normal rate and rhythm. S1,S2 noted.  No murmur, rubs or gallops noted.  Pulmonary/Chest: Normal effort and positive vesicular breath sounds. No respiratory distress. No wheezes, rales or ronchi noted.  Musculoskeletal: No difficulty with gait.  Neurological: Alert and oriented.  Psychiatric: Mood and affect normal. Mildly anxious appearing. Judgment and thought content normal.    BMET    Component Value Date/Time   NA 140 10/15/2023 0901   K 4.4 10/15/2023 0901   CL 102 10/15/2023 0901   CO2 29 10/15/2023 0901   GLUCOSE 105 (H) 10/15/2023 0901   BUN 15 10/15/2023 0901   CREATININE 0.81 10/15/2023 0901   CALCIUM  9.7 10/15/2023 0901   GFRNONAA >60 02/03/2021 1836   GFRNONAA 71  09/12/2020 1005   GFRAA 83 09/12/2020 1005    Lipid Panel     Component Value Date/Time   CHOL 203 (H) 10/15/2023 0901   TRIG 115 10/15/2023 0901   HDL 57 10/15/2023 0901   CHOLHDL 3.6 10/15/2023 0901   VLDL 16.6 02/23/2016 0905   LDLCALC 123 (H) 10/15/2023 0901    CBC    Component Value Date/Time   WBC 6.1 10/15/2023 0901   RBC 4.13 10/15/2023 0901   HGB 13.6 10/15/2023 0901   HCT 40.3 10/15/2023 0901   PLT 296 10/15/2023 0901   MCV 97.6 10/15/2023 0901   MCH 32.9 10/15/2023 0901   MCHC 33.7 10/15/2023 0901   RDW 11.8 10/15/2023 0901   LYMPHSABS 1,926 09/12/2020 1005   MONOABS 0.4 09/15/2015 1028   EOSABS 29 09/12/2020 1005   BASOSABS 29 09/12/2020 1005    Hgb A1C Lab Results  Component Value Date   HGBA1C 5.8 (H) 10/15/2023            Assessment & Plan:    RTC in 6 months for your annual exam Helayne Lo, NP

## 2024-04-28 ENCOUNTER — Encounter: Payer: Self-pay | Admitting: Internal Medicine

## 2024-04-28 ENCOUNTER — Ambulatory Visit (INDEPENDENT_AMBULATORY_CARE_PROVIDER_SITE_OTHER): Admitting: Internal Medicine

## 2024-04-28 VITALS — BP 102/68 | Ht 69.0 in | Wt 127.6 lb

## 2024-04-28 DIAGNOSIS — F5104 Psychophysiologic insomnia: Secondary | ICD-10-CM

## 2024-04-28 DIAGNOSIS — R7303 Prediabetes: Secondary | ICD-10-CM | POA: Diagnosis not present

## 2024-04-28 DIAGNOSIS — M8589 Other specified disorders of bone density and structure, multiple sites: Secondary | ICD-10-CM

## 2024-04-28 DIAGNOSIS — G43C1 Periodic headache syndromes in child or adult, intractable: Secondary | ICD-10-CM

## 2024-04-28 DIAGNOSIS — E782 Mixed hyperlipidemia: Secondary | ICD-10-CM

## 2024-04-28 DIAGNOSIS — Z8673 Personal history of transient ischemic attack (TIA), and cerebral infarction without residual deficits: Secondary | ICD-10-CM | POA: Diagnosis not present

## 2024-04-28 DIAGNOSIS — F411 Generalized anxiety disorder: Secondary | ICD-10-CM

## 2024-04-28 DIAGNOSIS — R413 Other amnesia: Secondary | ICD-10-CM

## 2024-04-28 MED ORDER — TRAZODONE HCL 50 MG PO TABS: 50.0000 mg | ORAL_TABLET | Freq: Every evening | ORAL | 1 refills | Status: DC | PRN

## 2024-04-28 NOTE — Assessment & Plan Note (Addendum)
A1c today Encourage low-carb diet 

## 2024-04-28 NOTE — Assessment & Plan Note (Signed)
 Will order MRI brain given that she is having more issues with memory loss Encouraged her to consume a low-fat diet C-Met and lipid profile today She is not taking rosuvastatin  5 mg daily as prescribed Will have her continue aspirin  81 mg daily

## 2024-04-28 NOTE — Assessment & Plan Note (Signed)
 Try to avoid triggers for your migraines Continue sumatriptan  50 milligrams as needed

## 2024-04-28 NOTE — Patient Instructions (Signed)
 Problems With Thinking and Memory (Mild Neurocognitive Disorder): What to Know Mild neurocognitive disorder, formerly known as mild cognitive impairment, is a disorder where your memory doesn't work as well as it should. It may also affect other mental abilities like thinking, communicating, behavior, and being able to finish tasks. These problems can be noticed and measured. But they usually don't stop you from doing daily activities or living on your own. Mild neurocognitive disorder usually happens after 60 years of age. But it can also happen at younger ages. It's not as serious as major neurocognitive disorder, also known as dementia, but it may be the first sign of it. In general, the symptoms of this condition get worse over time. In rare cases, symptoms can get better. What are the causes? This condition may be caused by: Brain disorders like Alzheimer's disease, Parkinson's disease, and other conditions that slowly damage nerve cells. Diseases that affect the blood vessels in the brain and cause small strokes. Certain infections, like HIV. Traumatic brain injury. Other medical conditions, such as brain tumors, underactive thyroid (hypothyroidism), and not having enough vitamin B12. Using certain drugs or medicines. What increases the risk? Being older than 60 years of age. Being female. Having a lower level of education. Diabetes, high blood pressure, high cholesterol, and other conditions that raise the risk for blood vessel diseases. Untreated or undertreated sleep apnea. Having a certain type of gene that can be inherited, or passed down from parent to child. Long-term health problems like heart disease, lung disease, liver disease, kidney disease, or depression. What are the signs or symptoms? Trouble remembering things. You may: Forget names, phone numbers, or details of recent events. Forget about social events and appointments. Often forget where you put your car keys or other  items. Trouble thinking and solving problems. You may have trouble with complex tasks like: Paying bills. Driving in places you don't know well. Trouble communicating. You may have trouble: Finding the right word or naming an object. Forming a sentence that makes sense. Understanding what you read or hear. Changes in your behavior or personality. When this happens, you may: Lose interest in the things you used to enjoy. Avoid being around people. Get angry more easily than usual. Act before thinking. How is this diagnosed? This condition is diagnosed based on: Your symptoms. Your health care provider may ask you and the people you spend time with, like family and friends, about your symptoms. Memory tests and other tests to check how your brain is working. Your provider may refer you to a provider called a neurologist or a mental health specialist. To try to find out the cause of your condition, your provider may: Get a detailed medical history. Ask about use of alcohol, drugs, and medicines. Do a physical exam. Order blood tests and brain imaging tests. How is this treated? Mild neurocognitive disorder that's caused by medicine use, drug use, infection, or another medical condition may get better when the cause is treated, or when medicines or drugs are stopped. If this disorder has another cause, it usually doesn't improve and may get worse. In these cases, the goal of treatment is to help you manage the symptoms. This may include: Medicines to help with memory and behavior symptoms. Talk therapy. This provides education, emotional support, memory aids, and other ways of making up for problems with mental tasks. Lifestyle changes. These may include: Getting regular exercise. Eating a healthy diet that includes omega-3 fatty acids. Doing things to challenge your thinking  and memory skills. Spending more time being with and talking to other people. Using routines like having regular  times for meals and going to bed. Follow these instructions at home: Eating and drinking  Drink more fluids as told. Eat a healthy diet that includes omega-3 fatty acids. These can be found in: Fish. Nuts. Leafy vegetables. Vegetable oils. If you drink alcohol: Limit how much you have to: 0-1 drink a day if you're female. 0-2 drinks a day if you're female. Know how much alcohol is in your drink. In the [place] one drink is one 12 oz bottle of beer (355 mL), one 5 oz glass of wine (148 mL), or one 1 oz glass of hard liquor (44 mL). Lifestyle  Get regular exercise as told by your provider. Do not smoke, vape, or use nicotine or tobacco. Use healthy ways to manage stress. If you need help managing stress, ask your provider. Keep spending time with other people. Keep your mind active by doing activities you enjoy, like reading or playing games. Make sure you get good sleep at night. These tips can help: Try not to take naps during the day. Keep your bedroom dark and cool. Do not exercise in the few hours before you go to bed. Do not have foods or drinks with caffeine at night. General instructions Take medicines only as told. Your provider may tell you to avoid taking medicines that can affect thinking. These include some medicines for pain or sleeping. Work with your provider to find out: What things you need help with. What your safety needs are. Where to find more information [employer] on Aging: [email or web address] Contact a health care provider if: You have any new symptoms. Get help right away if: You have new confusion or your confusion gets worse. You act in ways that put you or your family in danger. This information is not intended to replace advice given to you by your health care provider. Make sure you discuss any questions you have with your health care provider. Document Revised: 04/16/2023 Document Reviewed: 04/16/2023 Elsevier Patient Education  2024 [employer].

## 2024-04-28 NOTE — Assessment & Plan Note (Signed)
 Advised her to start taking 5000 units of vitamin D3 along with 1000 mg of calcium  daily Encouraged daily weightbearing exercise

## 2024-04-28 NOTE — Assessment & Plan Note (Signed)
 Continue melatonin 3 mg and trazodone  50 to 75 mg at bedtime as needed Will monitor

## 2024-04-28 NOTE — Progress Notes (Signed)
 Subjective:    Patient ID: Erika Hurley, female    DOB: 10/24/1966, 58 y.o.   MRN: [id number]  HPI  Patient presents to clinic today for follow-up of chronic conditions.  Migraines: These occur once a month.  Triggered by hormones and seasonal changes.  She takes sumatriptan  as needed with good relief of symptoms.  She no longer follows with neurology.  HLD with history of stroke: Her last LDL was 123, triglycerides 115, 10/2023.  She is not taking rosuvastatin .  She is taking aspirin  as well. She has noticed that she has been difficulty with her memory, trouble with recall. She tries to consume a low-fat diet.  She no longer follows with [facility].  Insomnia: She has difficulty falling and staying asleep.  She takes melatonin and trazodone  as prescribed with some relief of symptoms.  There is no sleep study on file.  Anxiety: Persistent.  She takes buspirone   and xanax  as needed with some relief of symptoms.  She is not currently seeing a therapist.  She denies depression, SI/HI.  Prediabetes: Her last A1c was 5.8%, 10/2023.  She is not taking any oral diabetic medication at this time.  She does not check her sugars.  Osteopenia: She is not taking any calcium  or vitamin D  OTC. She is not getting any weight bearing exercise.  Bone density from 12/2022 reviewed.  Review of Systems     Past Medical History:  Diagnosis Date   Arthritis    Cervical dysplasia    History of abnormal cervical Pap smear    HGSIL   Migraines    Solitary cyst of breast    Stroke [facility])     Current Outpatient Medications  Medication Sig Dispense Refill   ALPRAZolam  (XANAX ) 0.25 MG tablet Take 1 tablet (0.25 mg total) by mouth 2 (two) times daily as needed for anxiety. 20 tablet 0   aspirin  EC 81 MG tablet Take 1 tablet (81 mg total) by mouth daily. Swallow whole.     busPIRone  (BUSPAR ) 5 MG tablet Take 1 tablet (5 mg total) by mouth 3 (three) times daily. 270 tablet 1   gabapentin  (NEURONTIN )  100 MG capsule Take 1 capsule (100 mg total) by mouth 3 (three) times daily. 30 capsule 0   Melatonin 3 MG TABS Take 30 mg by mouth.     rosuvastatin  (CRESTOR ) 5 MG tablet Take 1 tablet (5 mg total) by mouth daily. (Patient not taking: Reported on 03/06/2024) 90 tablet 3   SUMAtriptan  (IMITREX ) 50 MG tablet TAKE 1 TABLET BY MOUTH AT ONSET OF MIGRAINE HEADACHE.  MAY REPEAT IN 2 HOURS IF HEADACHE PERSISTS OR RECURS 10 tablet 1   traZODone  (DESYREL ) 50 MG tablet Take 1.5-2 tablets (75-100 mg total) by mouth at bedtime as needed for sleep. 180 tablet 1   triamcinolone  cream (KENALOG ) 0.1 % Apply topically bid prn for itchy areas at elbows for dermatitis. Avoid applying to face, groin, and axilla. Use as directed. 30 g 1   valACYclovir  (VALTREX ) 1000 MG tablet Take 1 tablet (1,000 mg total) by mouth 3 (three) times daily. For shingles 21 tablet 0   No current facility-administered medications for this visit.    Allergies  Allergen Reactions   Sulfa Antibiotics Anaphylaxis    Family History  Problem Relation Age of Onset   Lung cancer Mother    Hypertension Mother    Hyperlipidemia Mother    Kidney disease Mother    Breast cancer Mother 42  twice   Stroke Father    Heart disease Father    Hypertension Father    Hyperlipidemia Father    Kidney disease Father    Dementia Father    Prostate cancer Maternal Grandfather    Dementia Paternal Grandmother     Social History   Socioeconomic History   Marital status: Media planner    Spouse name: Not on file   Number of children: Not on file   Years of education: Not on file   Highest education level: Associate degree: occupational, Scientist, product/process development, or vocational program  Occupational History   Not on file  Tobacco Use   Smoking status: Never   Smokeless tobacco: Never  Vaping Use   Vaping status: Never Used  Substance and Sexual Activity   Alcohol use: Yes    Alcohol/week: 2.0 standard drinks of alcohol    Types: 2 Glasses of  wine per week   Drug use: No   Sexual activity: Yes    Partners: Male    Birth control/protection: Post-menopausal  Other Topics Concern   Not on file  Social History Narrative   Divorced    Employed Doctor, hospital    Social Drivers of Health   Financial Resource Strain: Low Risk  (04/24/2024)   Overall Financial Resource Strain (CARDIA)    Difficulty of Paying Living Expenses: Not hard at all  Food Insecurity: No Food Insecurity (04/24/2024)   Hunger Vital Sign    Worried About Running Out of Food in the Last Year: Never true    Ran Out of Food in the Last Year: Never true  Transportation Needs: No Transportation Needs (04/24/2024)   PRAPARE - Administrator, Civil Service (Medical): No    Lack of Transportation (Non-Medical): No  Physical Activity: Insufficiently Active (04/24/2024)   Exercise Vital Sign    Days of Exercise per Week: 1 day    Minutes of Exercise per Session: 60 min  Stress: Stress Concern Present (04/24/2024)   [employer] of Occupational Health - Occupational Stress Questionnaire    Feeling of Stress: Rather much  Social Connections: Unknown (04/24/2024)   Social Connection and Isolation Panel    Frequency of Communication with Friends and Family: More than three times a week    Frequency of Social Gatherings with Friends and Family: More than three times a week    Attends Religious Services: More than 4 times per year    Active Member of [employer] or Organizations: Not on file    Attends Banker Meetings: Not on file    Marital Status: Divorced  Intimate Partner Violence: Patient Declined (10/15/2023)   Humiliation, Afraid, Rape, and Kick questionnaire    Fear of Current or Ex-Partner: Patient declined    Emotionally Abused: Patient declined    Physically Abused: Patient declined    Sexually Abused: Patient declined     Constitutional: Patient reports intermittent headaches.  Denies fever, malaise, fatigue, or abrupt  weight changes.  Respiratory: Pt reports ringing in ears. Denies difficulty breathing, shortness of breath, cough or sputum production.   Cardiovascular: Denies chest pain, chest tightness, palpitations or swelling in the hands or feet.  Gastrointestinal: Denies abdominal pain, bloating, constipation, diarrhea or blood in the stool.  GU: Denies urgency, frequency, pain with urination, burning sensation, blood in urine, odor or discharge. Musculoskeletal: Denies decrease in range of motion, difficulty with gait, muscle pain or joint pain and swelling.  Skin: Denies redness, rashes, lesions or ulcercations.  Neurological:  Patient reports insomnia, difficulty with memory.  Denies dizziness, difficulty with speech or problems with balance and coordination.  Psych: Patient has a history of anxiety.  Denies depression, SI/HI.  No other specific complaints in a complete review of systems (except as listed in HPI above).  Objective:   Physical Exam  BP 102/68 (BP Location: Left Arm, Patient Position: Sitting, Cuff Size: Normal)   Ht 5' 9 (1.753 m)   Wt 127 lb 9.6 oz (57.9 kg)   BMI 18.84 kg/m    Wt Readings from Last 3 Encounters:  03/06/24 128 lb 12.8 oz (58.4 kg)  01/30/24 129 lb 6.4 oz (58.7 kg)  01/14/24 133 lb (60.3 kg)    General: Appears her stated age, well developed, well nourished in NAD. Skin: Warm, dry and intact.  HEENT: Head: normal shape and size; Eyes: sclera white, no icterus, conjunctiva pink, PERRLA and EOMs intact; Ears:  Cardiovascular: Normal rate and rhythm. S1,S2 noted.  No murmur, rubs or gallops noted.  Pulmonary/Chest: Normal effort and positive vesicular breath sounds. No respiratory distress. No wheezes, rales or ronchi noted.  Musculoskeletal: No difficulty with gait.  Neurological: Alert and oriented.  Psychiatric: Mood and affect normal. Mildly anxious appearing. Judgment and thought content normal.    BMET    Component Value Date/Time   NA 140  10/15/2023 0901   K 4.4 10/15/2023 0901   CL 102 10/15/2023 0901   CO2 29 10/15/2023 0901   GLUCOSE 105 (H) 10/15/2023 0901   BUN 15 10/15/2023 0901   CREATININE 0.81 10/15/2023 0901   CALCIUM  9.7 10/15/2023 0901   GFRNONAA >60 02/03/2021 1836   GFRNONAA 71 09/12/2020 1005   GFRAA 83 09/12/2020 1005    Lipid Panel     Component Value Date/Time   CHOL 203 (H) 10/15/2023 0901   TRIG 115 10/15/2023 0901   HDL 57 10/15/2023 0901   CHOLHDL 3.6 10/15/2023 0901   VLDL 16.6 02/23/2016 0905   LDLCALC 123 (H) 10/15/2023 0901    CBC    Component Value Date/Time   WBC 6.1 10/15/2023 0901   RBC 4.13 10/15/2023 0901   HGB 13.6 10/15/2023 0901   HCT 40.3 10/15/2023 0901   PLT 296 10/15/2023 0901   MCV 97.6 10/15/2023 0901   MCH 32.9 10/15/2023 0901   MCHC 33.7 10/15/2023 0901   RDW 11.8 10/15/2023 0901   LYMPHSABS 1,926 09/12/2020 1005   MONOABS 0.4 09/15/2015 1028   EOSABS 29 09/12/2020 1005   BASOSABS 29 09/12/2020 1005    Hgb A1C Lab Results  Component Value Date   HGBA1C 5.8 (H) 10/15/2023            Assessment & Plan:    RTC in 6 months for your annual exam Erika Laura, NP

## 2024-04-28 NOTE — Assessment & Plan Note (Signed)
 Stable on her current dose of Xanax  0.25 mg (used rarely) and buspirone  5 mg 3 times daily Support offered

## 2024-04-28 NOTE — Assessment & Plan Note (Signed)
 C-Met and lipid profile today Encourage her to consume a low-fat diet She is not taking rosuvastatin  5 mg daily as prescribed

## 2024-04-29 ENCOUNTER — Ambulatory Visit: Payer: Self-pay | Admitting: Internal Medicine

## 2024-04-29 LAB — COMPREHENSIVE METABOLIC PANEL WITH GFR
AG Ratio: 2 (calc) (ref 1.0–2.5)
ALT: 12 U/L (ref 6–29)
AST: 16 U/L (ref 10–35)
Albumin: 4.7 g/dL (ref 3.6–5.1)
Alkaline phosphatase (APISO): 66 U/L (ref 37–153)
BUN: 10 mg/dL (ref 7–25)
CO2: 29 mmol/L (ref 20–32)
Calcium: 9.7 mg/dL (ref 8.6–10.4)
Chloride: 104 mmol/L (ref 98–110)
Creat: 1.01 mg/dL (ref 0.50–1.05)
Globulin: 2.3 g/dL (ref 1.9–3.7)
Glucose, Bld: 97 mg/dL (ref 65–139)
Potassium: 4.2 mmol/L (ref 3.5–5.3)
Sodium: 141 mmol/L (ref 135–146)
Total Bilirubin: 0.5 mg/dL (ref 0.2–1.2)
Total Protein: 7 g/dL (ref 6.1–8.1)
eGFR: 64 mL/min/{1.73_m2} (ref >=60)

## 2024-04-29 LAB — CBC
HCT: 40.5 % (ref 35.0–45.0)
Hemoglobin: 13 g/dL (ref 11.7–15.5)
MCH: 31.9 pg (ref 27.0–33.0)
MCHC: 32.1 g/dL (ref 32.0–36.0)
MCV: 99.3 fL (ref 80.0–100.0)
MPV: 9.7 fL (ref 7.5–12.5)
Platelets: 234 10*3/uL (ref 140–400)
RBC: 4.08 10*6/uL (ref 3.80–5.10)
RDW: 12.1 % (ref 11.0–15.0)
WBC: 5 10*3/uL (ref 3.8–10.8)

## 2024-04-29 LAB — LIPID PANEL
Cholesterol: 212 mg/dL — ABNORMAL HIGH (ref <200)
HDL: 53 mg/dL (ref >=50)
LDL Cholesterol (Calc): 138 mg/dL — ABNORMAL HIGH
Non-HDL Cholesterol (Calc): 159 mg/dL — ABNORMAL HIGH (ref <130)
Total CHOL/HDL Ratio: 4 (calc) (ref <5.0)
Triglycerides: 103 mg/dL (ref <150)

## 2024-04-29 LAB — HEMOGLOBIN A1C
Hgb A1c MFr Bld: 5.7 % — ABNORMAL HIGH (ref <5.7)
Mean Plasma Glucose: 117 mg/dL
eAG (mmol/L): 6.5 mmol/L

## 2024-05-04 ENCOUNTER — Ambulatory Visit
Admission: RE | Admit: 2024-05-04 | Discharge: 2024-05-04 | Disposition: A | Discharge status: Home / Self Care | Source: Ambulatory Visit | Attending: Internal Medicine | Admitting: Internal Medicine

## 2024-05-04 DIAGNOSIS — R413 Other amnesia: Secondary | ICD-10-CM | POA: Diagnosis present

## 2024-05-04 DIAGNOSIS — Z8673 Personal history of transient ischemic attack (TIA), and cerebral infarction without residual deficits: Secondary | ICD-10-CM | POA: Diagnosis present

## 2024-06-20 ENCOUNTER — Encounter: Payer: Self-pay | Admitting: Internal Medicine

## 2024-06-23 MED ORDER — VALACYCLOVIR HCL 500 MG PO TABS
500.0000 mg | ORAL_TABLET | Freq: Every day | ORAL | 1 refills | Status: AC
Start: 2024-06-23 — End: ?

## 2024-07-09 ENCOUNTER — Other Ambulatory Visit: Payer: Self-pay | Admitting: Internal Medicine

## 2024-07-09 NOTE — Telephone Encounter (Signed)
 Rx 04/28/24 #90 1RF- 6 month supply- too soon Requested Prescriptions  Pending Prescriptions Disp Refills   traZODone  (DESYREL ) 50 MG tablet [Pharmacy Med Name: traZODone  HCl 50 MG Oral Tablet] 180 tablet 0    Sig: TAKE 1 & 1/2 TO 2 (ONE & ONE-HALF TO TWO) TABLETS BY MOUTH AT BEDTIME AS NEEDED FOR SLEEP     Psychiatry: Antidepressants - Serotonin Modulator Passed - 07/09/2024  2:24 PM      Passed - Valid encounter within last 6 months    Recent Outpatient Visits           2 months ago Prediabetes   [facility] [place], [place] W, NP   4 months ago Herpes zoster without complication   [place] [facility] [place], Angeline ORN, NP   5 months ago Musculoskeletal back pain   [place] [facility] [place], Angeline ORN, NP   5 months ago Other eczema   [place] [facility] [place], [place] W, NP   5 months ago Herpes zoster without complication    [facility] [place], Angeline ORN, NP       Future Appointments             In 3 months Jackquline Sawyer, MD [facility]

## 2024-08-02 ENCOUNTER — Other Ambulatory Visit: Payer: Self-pay | Admitting: Internal Medicine

## 2024-08-04 NOTE — Telephone Encounter (Signed)
 Requested Prescriptions  Pending Prescriptions Disp Refills   traZODone  (DESYREL ) 50 MG tablet [Pharmacy Med Name: traZODone  HCl 50 MG Oral Tablet] 135 tablet 1    Sig: TAKE 1 & 1/2 TO 2 (ONE & ONE-HALF TO TWO) TABLETS BY MOUTH AT BEDTIME AS NEEDED FOR SLEEP     Psychiatry: Antidepressants - Serotonin Modulator Passed - 08/04/2024  4:42 PM      Passed - Valid encounter within last 6 months    Recent Outpatient Visits           3 months ago Prediabetes   [place] [facility] [place], [place] W, NP   5 months ago Herpes zoster without complication   [place] [facility] [place], Angeline ORN, NP   6 months ago Musculoskeletal back pain   [place] [facility] [place], Angeline ORN, NP   6 months ago Other eczema   [place] [facility] [place], [place] W, NP   6 months ago Herpes zoster without complication   [place] [facility] [place], Angeline ORN, NP       Future Appointments             In 2 months Jackquline Sawyer, MD [facility]

## 2024-08-20 ENCOUNTER — Encounter: Payer: Self-pay | Admitting: Internal Medicine

## 2024-09-01 ENCOUNTER — Other Ambulatory Visit: Payer: Self-pay | Admitting: Medical Genetics

## 2024-09-01 ENCOUNTER — Ambulatory Visit: Admitting: Internal Medicine

## 2024-09-01 NOTE — Progress Notes (Signed)
 Patient came today for gene connect DNA swab testing.  Advised her that this is done at the hospital laboratory and that she will need to go there for her to have this done.

## 2024-09-04 ENCOUNTER — Other Ambulatory Visit
Admission: RE | Admit: 2024-09-04 | Discharge: 2024-09-04 | Disposition: A | Discharge status: Home / Self Care | Payer: Self-pay | Source: Ambulatory Visit | Attending: Medical Genetics | Admitting: Medical Genetics

## 2024-09-13 ENCOUNTER — Other Ambulatory Visit: Payer: Self-pay | Admitting: Internal Medicine

## 2024-09-14 LAB — GENECONNECT MOLECULAR SCREEN: Genetic Analysis Overall Interpretation: NEGATIVE

## 2024-09-15 NOTE — Telephone Encounter (Signed)
 Requested Prescriptions  Pending Prescriptions Disp Refills   busPIRone  (BUSPAR ) 5 MG tablet [Pharmacy Med Name: busPIRone  HCl 5 MG Oral Tablet] 270 tablet 1    Sig: TAKE 1 TABLET BY MOUTH THREE TIMES DAILY     Psychiatry: Anxiolytics/Hypnotics - Non-controlled Passed - 09/15/2024 10:22 AM      Passed - Valid encounter within last 12 months    Recent Outpatient Visits           2 weeks ago Erroneous encounter - disregard   [place] [place], Angeline ORN, NP   4 months ago Prediabetes   [place] [facility] [place], [place] W, NP   6 months ago Herpes zoster without complication   [place] [facility] [place], Angeline ORN, NP   7 months ago Musculoskeletal back pain   [place] [facility] [place], Angeline ORN, NP   8 months ago Other eczema   [place] [facility] [place], Angeline ORN, NP       Future Appointments             In 1 month Jackquline Sawyer, MD [facility]

## 2024-09-25 ENCOUNTER — Encounter: Payer: Self-pay | Admitting: Internal Medicine

## 2024-09-25 ENCOUNTER — Other Ambulatory Visit: Payer: Self-pay

## 2024-09-25 ENCOUNTER — Ambulatory Visit: Admitting: Internal Medicine

## 2024-09-25 VITALS — BP 102/70 | Temp 98.0°F | Ht 69.0 in | Wt 125.0 lb

## 2024-09-25 DIAGNOSIS — R0981 Nasal congestion: Secondary | ICD-10-CM

## 2024-09-25 DIAGNOSIS — B9689 Other specified bacterial agents as the cause of diseases classified elsewhere: Secondary | ICD-10-CM

## 2024-09-25 DIAGNOSIS — J019 Acute sinusitis, unspecified: Secondary | ICD-10-CM | POA: Diagnosis not present

## 2024-09-25 LAB — POC COVID19/FLU A&B COMBO
Covid Antigen, POC: NEGATIVE
Influenza A Antigen, POC: NEGATIVE
Influenza B Antigen, POC: NEGATIVE

## 2024-09-25 MED ORDER — AMOXICILLIN-POT CLAVULANATE 400-57 MG/5ML PO SUSR
875.0000 mg | Freq: Two times a day (BID) | ORAL | 0 refills | Status: AC
Start: 2024-09-25 — End: ?

## 2024-09-25 MED ORDER — AMOXICILLIN-POT CLAVULANATE 400-57 MG/5ML PO SUSR: 875.0000 mg | Freq: Two times a day (BID) | ORAL | 0 refills | Status: DC

## 2024-09-25 NOTE — Progress Notes (Signed)
 Subjective:    Patient ID: Erika Hurley, female    DOB: [birth year], 58 y.o.   MRN: [id number]  HPI  Discussed the use of AI scribe software for clinical note transcription with the patient, who gave verbal consent to proceed.  Erika Hurley is a 58 year old female who presents with headache, nasal congestion, and fever.  She has been experiencing a headache for the past five days, described as localized from one side of her head to the other. She also notes nasal congestion and a scratchy sensation at the back of her throat.  She has been experiencing a fever between 99 and 100 degrees Fahrenheit, typically occurring between 2 and 3 in the afternoon. No sore throat, but there is a tickling sensation in her throat. She also has a mild cough and itching in her ears.  She experienced shortness of breath the other day, which has since improved. No nausea, vomiting, diarrhea, or body aches, but she mentions feeling cold, which she associates with the fever.  She has been taking Tylenol and Sudafed to manage her symptoms.   Review of Systems   Past Medical History:  Diagnosis Date   Allergy ongoing   seasonal   Anxiety    Arthritis    Cervical dysplasia    History of abnormal cervical Pap smear    HGSIL   Migraines    Solitary cyst of breast    Stroke [facility])     Current Outpatient Medications  Medication Sig Dispense Refill   ALPRAZolam  (XANAX ) 0.25 MG tablet Take 1 tablet (0.25 mg total) by mouth 2 (two) times daily as needed for anxiety. 20 tablet 0   aspirin  EC 81 MG tablet Take 1 tablet (81 mg total) by mouth daily. Swallow whole.     busPIRone  (BUSPAR ) 5 MG tablet TAKE 1 TABLET BY MOUTH THREE TIMES DAILY 270 tablet 1   Melatonin 3 MG TABS Take 30 mg by mouth.     SUMAtriptan  (IMITREX ) 50 MG tablet TAKE 1 TABLET BY MOUTH AT ONSET OF MIGRAINE HEADACHE.  MAY REPEAT IN 2 HOURS IF HEADACHE PERSISTS OR RECURS 10 tablet 1   traZODone  (DESYREL ) 50 MG tablet TAKE 1  & 1/2 TO 2 (ONE & ONE-HALF TO TWO) TABLETS BY MOUTH AT BEDTIME AS NEEDED FOR SLEEP 135 tablet 0   triamcinolone  cream (KENALOG ) 0.1 % Apply topically bid prn for itchy areas at elbows for dermatitis. Avoid applying to face, groin, and axilla. Use as directed. 30 g 1   valACYclovir  (VALTREX ) 500 MG tablet Take 1 tablet (500 mg total) by mouth daily. 90 tablet 1   No current facility-administered medications for this visit.    Allergies  Allergen Reactions   Sulfa Antibiotics Anaphylaxis    Family History  Problem Relation Age of Onset   Lung cancer Mother    Hypertension Mother    Hyperlipidemia Mother    Kidney disease Mother    Breast cancer Mother 1       twice   Cancer Mother    Stroke Father    Heart disease Father    Hypertension Father    Hyperlipidemia Father    Kidney disease Father    Dementia Father    ADD / ADHD Father    Prostate cancer Maternal Grandfather    Cancer Maternal Grandfather    Dementia Paternal Grandmother     Social History   Socioeconomic History   Marital status: Media Planner  Spouse name: Not on file   Number of children: Not on file   Years of education: Not on file   Highest education level: Associate degree: occupational, scientist, product/process development, or vocational program  Occupational History   Not on file  Tobacco Use   Smoking status: Never   Smokeless tobacco: Never  Vaping Use   Vaping status: Never Used  Substance and Sexual Activity   Alcohol use: Yes    Alcohol/week: 2.0 standard drinks of alcohol    Types: 2 Glasses of wine per week   Drug use: No   Sexual activity: Yes    Partners: Male    Birth control/protection: Post-menopausal  Other Topics Concern   Not on file  Social History Narrative   Divorced    Employed Doctor, Hospital    Social Drivers of Health   Financial Resource Strain: Low Risk  (09/01/2024)   Overall Financial Resource Strain (CARDIA)    Difficulty of Paying Living Expenses: Not very hard  Food  Insecurity: No Food Insecurity (09/01/2024)   Hunger Vital Sign    Worried About Running Out of Food in the Last Year: Never true    Ran Out of Food in the Last Year: Never true  Transportation Needs: No Transportation Needs (09/01/2024)   PRAPARE - Administrator, Civil Service (Medical): No    Lack of Transportation (Non-Medical): No  Physical Activity: Insufficiently Active (09/01/2024)   Exercise Vital Sign    Days of Exercise per Week: 3 days    Minutes of Exercise per Session: 30 min  Stress: Stress Concern Present (09/01/2024)   [employer] of Occupational Health - Occupational Stress Questionnaire    Feeling of Stress: Very much  Social Connections: Moderately Isolated (09/01/2024)   Social Connection and Isolation Panel    Frequency of Communication with Friends and Family: More than three times a week    Frequency of Social Gatherings with Friends and Family: Once a week    Attends Religious Services: More than 4 times per year    Active Member of [employer] or Organizations: No    Attends Banker Meetings: Not on file    Marital Status: Divorced  Intimate Partner Violence: Patient Declined (10/15/2023)   Humiliation, Afraid, Rape, and Kick questionnaire    Fear of Current or Ex-Partner: Patient declined    Emotionally Abused: Patient declined    Physically Abused: Patient declined    Sexually Abused: Patient declined     Constitutional: Patient reports fever, headache and fatigue.  Denies abrupt weight changes.  HEENT: Patient reports nasal congestion, scratchy throat.  Denies eye pain, eye redness, ear pain, ringing in the ears, wax buildup, runny nose, bloody nose, or sore throat. Respiratory: Patient reports cough and shortness of breath.  Denies difficulty breathing.   Cardiovascular: Denies chest pain, chest tightness, palpitations or swelling in the hands or feet.  Gastrointestinal: Denies abdominal pain, bloating, constipation, diarrhea  or blood in the stool.  GU: Denies urgency, frequency, pain with urination, burning sensation, blood in urine, odor or discharge. Musculoskeletal: Denies decrease in range of motion, difficulty with gait, muscle pain or joint pain and swelling.  Skin: Denies redness, rashes, lesions or ulcercations.  Neurological: Denies dizziness, difficulty with memory, difficulty with speech or problems with balance and coordination.  Psych: Denies anxiety, depression, SI/HI.  No other specific complaints in a complete review of systems (except as listed in HPI above).      Objective:   Physical Exam  BP 102/70 (BP Location: Right Arm, Patient Position: Sitting, Cuff Size: Normal)   Temp 98 F (36.7 C)   Ht 5' 9 (1.753 m)   Wt 125 lb (56.7 kg)   SpO2 96%   BMI 18.46 kg/m   Wt Readings from Last 3 Encounters:  04/28/24 127 lb 9.6 oz (57.9 kg)  03/06/24 128 lb 12.8 oz (58.4 kg)  01/30/24 129 lb 6.4 oz (58.7 kg)    General: Appears her stated age, appears unwell but in NAD. Skin: Warm, dry and intact.  HEENT: Head: normal shape and size, maxillary sinus pressure noted; Eyes: sclera white, no icterus, conjunctiva pink, PERRLA and EOMs intact; Ears: Tm's gray and intact, normal light reflex; Nose: mucosa pink and moist, septum midline; Throat/Mouth: Teeth present, mucosa pink and moist, + PND, no exudate, lesions or ulcerations noted.  Neck: No adenopathy noted. Cardiovascular: Normal rate and rhythm.  Pulmonary/Chest: Normal effort and positive vesicular breath sounds. No respiratory distress. No wheezes, rales or ronchi noted.  Musculoskeletal: No difficulty with gait.  Neurological: Alert and oriented.   BMET    Component Value Date/Time   NA 141 04/28/2024 0845   K 4.2 04/28/2024 0845   CL 104 04/28/2024 0845   CO2 29 04/28/2024 0845   GLUCOSE 97 04/28/2024 0845   BUN 10 04/28/2024 0845   CREATININE 1.01 04/28/2024 0845   CALCIUM  9.7 04/28/2024 0845   GFRNONAA >60 02/03/2021 1836    GFRNONAA 71 09/12/2020 1005   GFRAA 83 09/12/2020 1005    Lipid Panel     Component Value Date/Time   CHOL 212 (H) 04/28/2024 0845   TRIG 103 04/28/2024 0845   HDL 53 04/28/2024 0845   CHOLHDL 4.0 04/28/2024 0845   VLDL 16.6 02/23/2016 0905   LDLCALC 138 (H) 04/28/2024 0845    CBC    Component Value Date/Time   WBC 5.0 04/28/2024 0845   RBC 4.08 04/28/2024 0845   HGB 13.0 04/28/2024 0845   HCT 40.5 04/28/2024 0845   PLT 234 04/28/2024 0845   MCV 99.3 04/28/2024 0845   MCH 31.9 04/28/2024 0845   MCHC 32.1 04/28/2024 0845   RDW 12.1 04/28/2024 0845   LYMPHSABS 1,926 09/12/2020 1005   MONOABS 0.4 09/15/2015 1028   EOSABS 29 09/12/2020 1005   BASOSABS 29 09/12/2020 1005    Hgb A1C Lab Results  Component Value Date   HGBA1C 5.7 (H) 04/28/2024            Assessment & Plan:  Assessment and Plan    Acute sinusitis with nasal congestion Negative COVID and flu tests. Differential includes viral versus bacterial sinusitis. Treated with antibiotics due to potential bacterial progression and travel plans. - Encouraged rest and fluids - Can use a Nettie pot which can be purchased from your local pharmacy - Prescribed Augmentin  875-125 mg twice daily x 10 days.  -Recommend Zyrtec and Flonase OTC       RTC in 1 month for your annual exam Angeline Laura, NP

## 2024-09-25 NOTE — Patient Instructions (Signed)

## 2024-10-05 ENCOUNTER — Other Ambulatory Visit: Payer: Self-pay

## 2024-10-05 MED ORDER — TRAZODONE HCL 50 MG PO TABS: ORAL_TABLET | ORAL | 0 refills | Status: AC

## 2024-10-06 ENCOUNTER — Encounter: Payer: Self-pay | Admitting: Internal Medicine

## 2024-10-20 NOTE — Progress Notes (Unsigned)
 Subjective:    Patient ID: Erika Hurley, female    DOB: Jun 14, 1966, 58 y.o.   MRN: [id number]  HPI  Patient presents the clinic today for her annual exam.  Flu: 08/2019 Tetanus: 05/2017 COVID: Never Shingrix: x 2, [facility]  Pap smear: 10/2019 Mammogram: 12/2023 Bone density: 12/2022 Colon screening: 08/2021 Vision screening: annually Dentist: biannually  Diet: She does eat meat. She consumes fruits and veggies. She tries to avoid fried foods. She drinks mostly water and Mt Dew Exercise: None  Review of Systems   Past Medical History:  Diagnosis Date   Allergy ongoing   seasonal   Anxiety    Arthritis    Cervical dysplasia    History of abnormal cervical Pap smear    HGSIL   Migraines    Solitary cyst of breast    Stroke [facility])     Current Outpatient Medications  Medication Sig Dispense Refill   ALPRAZolam  (XANAX ) 0.25 MG tablet Take 1 tablet (0.25 mg total) by mouth 2 (two) times daily as needed for anxiety. 20 tablet 0   amoxicillin -clavulanate (AUGMENTIN ) 400-57 MG/5ML suspension Take 10.9 mLs (875 mg total) by mouth 2 (two) times daily. 218 mL 0   aspirin  EC 81 MG tablet Take 1 tablet (81 mg total) by mouth daily. Swallow whole.     busPIRone  (BUSPAR ) 5 MG tablet TAKE 1 TABLET BY MOUTH THREE TIMES DAILY 270 tablet 1   Melatonin 3 MG TABS Take 30 mg by mouth.     SUMAtriptan  (IMITREX ) 50 MG tablet TAKE 1 TABLET BY MOUTH AT ONSET OF MIGRAINE HEADACHE.  MAY REPEAT IN 2 HOURS IF HEADACHE PERSISTS OR RECURS 10 tablet 1   traZODone  (DESYREL ) 50 MG tablet TAKE 1 & 1/2 TO 2 (ONE & ONE-HALF TO TWO) TABLETS BY MOUTH AT BEDTIME AS NEEDED FOR SLEEP 135 tablet 0   triamcinolone  cream (KENALOG ) 0.1 % Apply topically bid prn for itchy areas at elbows for dermatitis. Avoid applying to face, groin, and axilla. Use as directed. 30 g 1   valACYclovir  (VALTREX ) 500 MG tablet Take 1 tablet (500 mg total) by mouth daily. 90 tablet 1   No current facility-administered medications  for this visit.    Allergies  Allergen Reactions   Sulfa Antibiotics Anaphylaxis    Family History  Problem Relation Age of Onset   Lung cancer Mother    Hypertension Mother    Hyperlipidemia Mother    Kidney disease Mother    Breast cancer Mother 62       twice   Cancer Mother    Stroke Father    Heart disease Father    Hypertension Father    Hyperlipidemia Father    Kidney disease Father    Dementia Father    ADD / ADHD Father    Prostate cancer Maternal Grandfather    Cancer Maternal Grandfather    Dementia Paternal Grandmother     Social History   Socioeconomic History   Marital status: Media Planner    Spouse name: Not on file   Number of children: Not on file   Years of education: Not on file   Highest education level: Associate degree: occupational, scientist, product/process development, or vocational program  Occupational History   Not on file  Tobacco Use   Smoking status: Never   Smokeless tobacco: Never  Vaping Use   Vaping status: Never Used  Substance and Sexual Activity   Alcohol use: Yes    Alcohol/week: 2.0 standard drinks of alcohol  Types: 2 Glasses of wine per week   Drug use: No   Sexual activity: Yes    Partners: Male    Birth control/protection: Post-menopausal  Other Topics Concern   Not on file  Social History Narrative   Divorced    Employed Doctor, Hospital    Social Drivers of Health   Tobacco Use: Low Risk (09/25/2024)   Patient History    Smoking Tobacco Use: Never    Smokeless Tobacco Use: Never    Passive Exposure: Not on file  Financial Resource Strain: Low Risk (09/01/2024)   Overall Financial Resource Strain (CARDIA)    Difficulty of Paying Living Expenses: Not very hard  Food Insecurity: No Food Insecurity (09/01/2024)   Epic    Worried About Radiation Protection Practitioner of Food in the Last Year: Never true    Ran Out of Food in the Last Year: Never true  Transportation Needs: No Transportation Needs (09/01/2024)   Epic    Lack of  Transportation (Medical): No    Lack of Transportation (Non-Medical): No  Physical Activity: Insufficiently Active (09/01/2024)   Exercise Vital Sign    Days of Exercise per Week: 3 days    Minutes of Exercise per Session: 30 min  Stress: Stress Concern Present (09/01/2024)   [employer] of Occupational Health - Occupational Stress Questionnaire    Feeling of Stress: Very much  Social Connections: Moderately Isolated (09/01/2024)   Social Connection and Isolation Panel    Frequency of Communication with Friends and Family: More than three times a week    Frequency of Social Gatherings with Friends and Family: Once a week    Attends Religious Services: More than 4 times per year    Active Member of [employer] or Organizations: No    Attends Engineer, Structural: Not on file    Marital Status: Divorced  Intimate Partner Violence: Patient Declined (10/15/2023)   Humiliation, Afraid, Rape, and Kick questionnaire    Fear of Current or Ex-Partner: Patient declined    Emotionally Abused: Patient declined    Physically Abused: Patient declined    Sexually Abused: Patient declined  Depression (PHQ2-9): Low Risk (09/25/2024)   Depression (PHQ2-9)    PHQ-2 Score: 0  Alcohol Screen: Low Risk (09/01/2024)   Alcohol Screen    Last Alcohol Screening Score (AUDIT): 2  Housing: Unknown (09/01/2024)   Epic    Unable to Pay for Housing in the Last Year: No    Number of Times Moved in the Last Year: Not on file    Homeless in the Last Year: No  Utilities: Patient Declined (10/15/2023)   [facility] Utilities    Threatened with loss of utilities: Patient declined  Health Literacy: Patient Declined (10/15/2023)   B1300 Health Literacy    Frequency of need for help with medical instructions: Patient declines to respond     Constitutional: Patient reports intermittent headaches.  Denies fever, malaise, fatigue, or abrupt weight changes.  HEENT: Denies eye pain, eye redness, ear pain, ringing in  the ears, wax buildup, runny nose, nasal congestion, bloody nose, or sore throat. Respiratory: Denies difficulty breathing, shortness of breath, cough or sputum production.   Cardiovascular: Pt reports intermittent palpitations. Denies chest pain, chest tightness, or swelling in the hands or feet.  Gastrointestinal: Denies abdominal pain, bloating, constipation, diarrhea or blood in the stool.  GU: Denies urgency, frequency, pain with urination, burning sensation, blood in urine, odor or discharge. Musculoskeletal: Denies decrease in range of motion, difficulty with gait,  muscle pain or joint pain or swelling.  Skin: Denies redness, rashes, lesions or ulcercations.  Neurological: Patient reports insomnia.  Denies dizziness, difficulty with memory, difficulty with speech or problems with balance and coordination.  Psych: Patient has a history of anxiety.  Denies depression, SI/HI.  No other specific complaints in a complete review of systems (except as listed in HPI above).   Objective:   Physical Exam  There were no vitals taken for this visit.   Wt Readings from Last 3 Encounters:  09/25/24 125 lb (56.7 kg)  04/28/24 127 lb 9.6 oz (57.9 kg)  03/06/24 128 lb 12.8 oz (58.4 kg)    General: Appears her stated age, well developed, well nourished in NAD. Skin: Warm, dry and intact.  HEENT: Head: normal shape and size; Eyes: sclera white, no icterus, conjunctiva pink, PERRLA and EOMs intact;  Neck:  Neck supple, trachea midline. No masses, lumps or thyromegaly present.  Cardiovascular: Normal rate and rhythm. S1,S2 noted.  No murmur, rubs or gallops noted. No JVD or BLE edema. No carotid bruits noted. Pulmonary/Chest: Normal effort and positive vesicular breath sounds. No respiratory distress. No wheezes, rales or ronchi noted.  Abdomen: Normal bowel sounds.  Musculoskeletal: Strength 5/5 BUE/BLE.  No difficulty with gait.  Neurological: Alert and oriented. Cranial nerves II-XII grossly  intact. Coordination normal.  Psychiatric: Mood and affect normal. Behavior is normal. Judgment and thought content normal.    BMET    Component Value Date/Time   NA 141 04/28/2024 0845   K 4.2 04/28/2024 0845   CL 104 04/28/2024 0845   CO2 29 04/28/2024 0845   GLUCOSE 97 04/28/2024 0845   BUN 10 04/28/2024 0845   CREATININE 1.01 04/28/2024 0845   CALCIUM  9.7 04/28/2024 0845   GFRNONAA >60 02/03/2021 1836   GFRNONAA 71 09/12/2020 1005   GFRAA 83 09/12/2020 1005    Lipid Panel     Component Value Date/Time   CHOL 212 (H) 04/28/2024 0845   TRIG 103 04/28/2024 0845   HDL 53 04/28/2024 0845   CHOLHDL 4.0 04/28/2024 0845   VLDL 16.6 02/23/2016 0905   LDLCALC 138 (H) 04/28/2024 0845    CBC    Component Value Date/Time   WBC 5.0 04/28/2024 0845   RBC 4.08 04/28/2024 0845   HGB 13.0 04/28/2024 0845   HCT 40.5 04/28/2024 0845   PLT 234 04/28/2024 0845   MCV 99.3 04/28/2024 0845   MCH 31.9 04/28/2024 0845   MCHC 32.1 04/28/2024 0845   RDW 12.1 04/28/2024 0845   LYMPHSABS 1,926 09/12/2020 1005   MONOABS 0.4 09/15/2015 1028   EOSABS 29 09/12/2020 1005   BASOSABS 29 09/12/2020 1005    Hgb A1C Lab Results  Component Value Date   HGBA1C 5.7 (H) 04/28/2024           Assessment & Plan:   Preventative Health Maintenance:  She declines flu shot today Tetanus UTD Encouraged her to get her COVID-vaccine Shingrix UTD per her report Pap smear  Mammogram ordered-she will call to schedule Bone density UTD Colon screening UTD Encouraged her to consume a balanced diet and exercise regimen Advised her to see an eye doctor and dentist annually We will check CBC, c-Met,  lipid profile and A1c today  RTC in 6 months, follow-up chronic conditions Angeline Laura, NP

## 2024-10-21 ENCOUNTER — Ambulatory Visit: Attending: Internal Medicine

## 2024-10-21 ENCOUNTER — Other Ambulatory Visit (HOSPITAL_COMMUNITY)
Admission: RE | Admit: 2024-10-21 | Discharge: 2024-10-21 | Disposition: A | Source: Ambulatory Visit | Attending: Internal Medicine | Admitting: Internal Medicine

## 2024-10-21 ENCOUNTER — Encounter: Payer: Self-pay | Admitting: Internal Medicine

## 2024-10-21 ENCOUNTER — Ambulatory Visit: Admitting: Internal Medicine

## 2024-10-21 VITALS — BP 110/70 | HR 57 | Ht 69.0 in | Wt 122.8 lb

## 2024-10-21 DIAGNOSIS — R7303 Prediabetes: Secondary | ICD-10-CM

## 2024-10-21 DIAGNOSIS — Z681 Body mass index (BMI) 19 or less, adult: Secondary | ICD-10-CM | POA: Insufficient documentation

## 2024-10-21 DIAGNOSIS — R002 Palpitations: Secondary | ICD-10-CM | POA: Diagnosis not present

## 2024-10-21 DIAGNOSIS — E782 Mixed hyperlipidemia: Secondary | ICD-10-CM | POA: Diagnosis not present

## 2024-10-21 DIAGNOSIS — R636 Underweight: Secondary | ICD-10-CM | POA: Diagnosis not present

## 2024-10-21 DIAGNOSIS — Z0001 Encounter for general adult medical examination with abnormal findings: Secondary | ICD-10-CM | POA: Insufficient documentation

## 2024-10-21 DIAGNOSIS — Z124 Encounter for screening for malignant neoplasm of cervix: Secondary | ICD-10-CM

## 2024-10-21 DIAGNOSIS — Z1231 Encounter for screening mammogram for malignant neoplasm of breast: Secondary | ICD-10-CM | POA: Diagnosis not present

## 2024-10-21 NOTE — Patient Instructions (Signed)
 Health Maintenance for Postmenopausal Women Menopause is a normal process in which your ability to get pregnant comes to an end. This process happens slowly over many months or years, usually between the ages of 76 and 38. Menopause is complete when you have missed your menstrual period for 12 months. It is important to talk with your health care provider about some of the most common conditions that affect women after menopause (postmenopausal women). These include heart disease, cancer, and bone loss (osteoporosis). Adopting a healthy lifestyle and getting preventive care can help to promote your health and wellness. The actions you take can also lower your chances of developing some of these common conditions. What are the signs and symptoms of menopause? During menopause, you may have the following symptoms: Hot flashes. These can be moderate or severe. Night sweats. Decrease in sex drive. Mood swings. Headaches. Tiredness (fatigue). Irritability. Memory problems. Problems falling asleep or staying asleep. Talk with your health care provider about treatment options for your symptoms. Do I need hormone replacement therapy? Hormone replacement therapy is effective in treating symptoms that are caused by menopause, such as hot flashes and night sweats. Hormone replacement carries certain risks, especially as you become older. If you are thinking about using estrogen or estrogen with progestin, discuss the benefits and risks with your health care provider. How can I reduce my risk for heart disease and stroke? The risk of heart disease, heart attack, and stroke increases as you age. One of the causes may be a change in the body's hormones during menopause. This can affect how your body uses dietary fats, triglycerides, and cholesterol. Heart attack and stroke are medical emergencies. There are many things that you can do to help prevent heart disease and stroke. Watch your blood pressure High  blood pressure causes heart disease and increases the risk of stroke. This is more likely to develop in people who have high blood pressure readings or are overweight. Have your blood pressure checked: Every 3-5 years if you are 32-23 years of age. Every year if you are 31 years old or older. Eat a healthy diet  Eat a diet that includes plenty of vegetables, fruits, low-fat dairy products, and lean protein. Do not eat a lot of foods that are high in solid fats, added sugars, or sodium. Get regular exercise Get regular exercise. This is one of the most important things you can do for your health. Most adults should: Try to exercise for at least 150 minutes each week. The exercise should increase your heart rate and make you sweat (moderate-intensity exercise). Try to do strengthening exercises at least twice each week. Do these in addition to the moderate-intensity exercise. Spend less time sitting. Even light physical activity can be beneficial. Other tips Work with your health care provider to achieve or maintain a healthy weight. Do not use any products that contain nicotine or tobacco. These products include cigarettes, chewing tobacco, and vaping devices, such as e-cigarettes. If you need help quitting, ask your health care provider. Know your numbers. Ask your health care provider to check your cholesterol and your blood sugar (glucose). Continue to have your blood tested as directed by your health care provider. Do I need screening for cancer? Depending on your health history and family history, you may need to have cancer screenings at different stages of your life. This may include screening for: Breast cancer. Cervical cancer. Lung cancer. Colorectal cancer. What is my risk for osteoporosis? After menopause, you may be  at increased risk for osteoporosis. Osteoporosis is a condition in which bone destruction happens more quickly than new bone creation. To help prevent osteoporosis or  the bone fractures that can happen because of osteoporosis, you may take the following actions: If you are 24-54 years old, get at least 1,000 mg of calcium and at least 600 international units (IU) of vitamin D  per day. If you are older than age 75 but younger than age 30, get at least 1,200 mg of calcium and at least 600 international units (IU) of vitamin D  per day. If you are older than age 8, get at least 1,200 mg of calcium and at least 800 international units (IU) of vitamin D  per day. Smoking and drinking excessive alcohol increase the risk of osteoporosis. Eat foods that are rich in calcium and vitamin D , and do weight-bearing exercises several times each week as directed by your health care provider. How does menopause affect my mental health? Depression may occur at any age, but it is more common as you become older. Common symptoms of depression include: Feeling depressed. Changes in sleep patterns. Changes in appetite or eating patterns. Feeling an overall lack of motivation or enjoyment of activities that you previously enjoyed. Frequent crying spells. Talk with your health care provider if you think that you are experiencing any of these symptoms. General instructions See your health care provider for regular wellness exams and vaccines. This may include: Scheduling regular health, dental, and eye exams. Getting and maintaining your vaccines. These include: Influenza vaccine. Get this vaccine each year before the flu season begins. Pneumonia vaccine. Shingles vaccine. Tetanus, diphtheria, and pertussis (Tdap) booster vaccine. Your health care provider may also recommend other immunizations. Tell your health care provider if you have ever been abused or do not feel safe at home. Summary Menopause is a normal process in which your ability to get pregnant comes to an end. This condition causes hot flashes, night sweats, decreased interest in sex, mood swings, headaches, or lack  of sleep. Treatment for this condition may include hormone replacement therapy. Take actions to keep yourself healthy, including exercising regularly, eating a healthy diet, watching your weight, and checking your blood pressure and blood sugar levels. Get screened for cancer and depression. Make sure that you are up to date with all your vaccines. This information is not intended to replace advice given to you by your health care provider. Make sure you discuss any questions you have with your health care provider. Document Revised: 03/13/2021 Document Reviewed: 03/13/2021 Elsevier Patient Education  2024 [employer].

## 2024-10-21 NOTE — Assessment & Plan Note (Signed)
 Encourage high protein, high calorie diet

## 2024-10-22 ENCOUNTER — Ambulatory Visit: Payer: Self-pay | Admitting: Internal Medicine

## 2024-10-22 LAB — COMPREHENSIVE METABOLIC PANEL WITH GFR
AG Ratio: 2.1 (calc) (ref 1.0–2.5)
ALT: 8 U/L (ref 6–29)
AST: 15 U/L (ref 10–35)
Albumin: 4.9 g/dL (ref 3.6–5.1)
Alkaline phosphatase (APISO): 72 U/L (ref 37–153)
BUN: 11 mg/dL (ref 7–25)
CO2: 27 mmol/L (ref 20–32)
Calcium: 9.6 mg/dL (ref 8.6–10.4)
Chloride: 103 mmol/L (ref 98–110)
Creat: 1 mg/dL (ref 0.50–1.05)
Globulin: 2.3 g/dL (ref 1.9–3.7)
Glucose, Bld: 99 mg/dL (ref 65–99)
Potassium: 4.4 mmol/L (ref 3.5–5.3)
Sodium: 140 mmol/L (ref 135–146)
Total Bilirubin: 0.8 mg/dL (ref 0.2–1.2)
Total Protein: 7.2 g/dL (ref 6.1–8.1)
eGFR: 64 mL/min/1.73m2 (ref >=60)

## 2024-10-22 LAB — CBC
HCT: 39.5 % (ref 35.9–46.0)
Hemoglobin: 12.9 g/dL (ref 11.7–15.5)
MCH: 30.9 pg (ref 27.0–33.0)
MCHC: 32.7 g/dL (ref 31.6–35.4)
MCV: 94.5 fL (ref 81.4–101.7)
MPV: 10 fL (ref 7.5–12.5)
Platelets: 259 Thousand/uL (ref 140–400)
RBC: 4.18 Million/uL (ref 3.80–5.10)
RDW: 11.6 % (ref 11.0–15.0)
WBC: 4.6 Thousand/uL (ref 3.8–10.8)

## 2024-10-22 LAB — HEMOGLOBIN A1C
Hgb A1c MFr Bld: 5.6 % (ref <5.7)
Mean Plasma Glucose: 114 mg/dL
eAG (mmol/L): 6.3 mmol/L

## 2024-10-22 LAB — LIPID PANEL
Cholesterol: 216 mg/dL — ABNORMAL HIGH (ref <200)
HDL: 49 mg/dL — ABNORMAL LOW (ref >=50)
LDL Cholesterol (Calc): 147 mg/dL — ABNORMAL HIGH
Non-HDL Cholesterol (Calc): 167 mg/dL — ABNORMAL HIGH (ref <130)
Total CHOL/HDL Ratio: 4.4 (calc) (ref <5.0)
Triglycerides: 91 mg/dL (ref <150)

## 2024-10-22 LAB — TSH: TSH: 1.73 m[IU]/L (ref 0.40–4.50)

## 2024-10-23 LAB — CYTOLOGY - PAP
Comment: NEGATIVE
Diagnosis: NEGATIVE
High risk HPV: NEGATIVE

## 2024-10-27 ENCOUNTER — Ambulatory Visit: Payer: 59 | Admitting: Dermatology

## 2024-11-03 ENCOUNTER — Ambulatory Visit: Admitting: Dermatology

## 2024-11-03 DIAGNOSIS — L578 Other skin changes due to chronic exposure to nonionizing radiation: Secondary | ICD-10-CM

## 2024-11-03 DIAGNOSIS — Z1283 Encounter for screening for malignant neoplasm of skin: Secondary | ICD-10-CM

## 2024-11-03 DIAGNOSIS — L821 Other seborrheic keratosis: Secondary | ICD-10-CM

## 2024-11-03 DIAGNOSIS — W908XXA Exposure to other nonionizing radiation, initial encounter: Secondary | ICD-10-CM

## 2024-11-03 DIAGNOSIS — L814 Other melanin hyperpigmentation: Secondary | ICD-10-CM

## 2024-11-03 DIAGNOSIS — D229 Melanocytic nevi, unspecified: Secondary | ICD-10-CM

## 2024-11-03 DIAGNOSIS — Z872 Personal history of diseases of the skin and subcutaneous tissue: Secondary | ICD-10-CM | POA: Diagnosis not present

## 2024-11-03 DIAGNOSIS — D225 Melanocytic nevi of trunk: Secondary | ICD-10-CM

## 2024-11-03 DIAGNOSIS — L565 Disseminated superficial actinic porokeratosis (DSAP): Secondary | ICD-10-CM | POA: Diagnosis not present

## 2024-11-03 DIAGNOSIS — D1801 Hemangioma of skin and subcutaneous tissue: Secondary | ICD-10-CM

## 2024-11-03 DIAGNOSIS — L820 Inflamed seborrheic keratosis: Secondary | ICD-10-CM

## 2024-11-03 NOTE — Progress Notes (Unsigned)
 "  Follow-Up Visit   Subjective  Erika Hurley is a 58 y.o. female who presents for the following: Skin Cancer Screening and Full Body Skin Exam  The patient presents for Total-Body Skin Exam (TBSE) for skin cancer screening and mole check. The patient has spots, moles and lesions to be evaluated, some may be new or changing. She has a spot below right eye that came up about 2 months ago, growing. History of AKs. DSAP of the arms and legs, using cholesterol/lovastatin cream.    The following portions of the chart were reviewed this encounter and updated as appropriate: medications, allergies, medical history  Review of Systems:  No other skin or systemic complaints except as noted in HPI or Assessment and Plan.  Objective  Well appearing patient in no apparent distress; mood and affect are within normal limits.  A full examination was performed including scalp, head, eyes, ears, nose, lips, neck, chest, axillae, abdomen, back, buttocks, bilateral upper extremities, bilateral lower extremities, hands, feet, fingers, toes, fingernails, and toenails. All findings within normal limits unless otherwise noted below.   Relevant physical exam findings are noted in the Assessment and Plan.  R infraocular x 1, L post shoulder x 1, R flank at bra line x 1, R lateral lower leg x 1 (4) Erythematous stuck-on, waxy papule or plaque  Assessment & Plan   SKIN CANCER SCREENING PERFORMED TODAY.  ACTINIC DAMAGE - Chronic condition, secondary to cumulative UV/sun exposure - diffuse scaly erythematous macules with underlying dyspigmentation - Recommend daily broad spectrum sunscreen SPF 30+ to sun-exposed areas, reapply every 2 hours as needed.  - Staying in the shade or wearing long sleeves, sun glasses (UVA+UVB protection) and wide brim hats (4-inch brim around the entire circumference of the hat) are also recommended for sun protection.  - Call for new or changing lesions.  LENTIGINES, SEBORRHEIC  KERATOSES, HEMANGIOMAS - Benign normal skin lesions - Benign-appearing - Call for any changes  MELANOCYTIC NEVI - Tan-brown and/or pink-flesh-colored symmetric macules and papules - Left Flank at braline 3 mm light pink tan macule, darker center - Benign appearing on exam today - Observation - Call clinic for new or changing moles - Recommend daily use of broad spectrum spf 30+ sunscreen to sun-exposed areas.   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    Chronic and persistent condition with duration or expected duration over one year. Condition is symptomatic/ bothersome to patient. Not currently at goal.    DSAP is a chronic inherited condition of sun-exposed skin, most commonly affecting the arms and legs.  It is difficult to treat.  Recommend photoprotection and regular use of spf 30 or higher sunscreen to prevent worsening of condition and precancerous changes.   Treatment Plan:  Continue Cholesterol 2% Lovastatin 2% Cream 240 gm- Apply twice daily as directed to affected areas arms and legs.  Sent to [facility].  HISTORY OF PRECANCEROUS ACTINIC KERATOSIS - site(s) of PreCancerous Actinic Keratosis clear today. - these may recur and new lesions may form requiring treatment to prevent transformation into skin cancer - observe for new or changing spots and contact [facility] for appointment if occur - photoprotection with sun protective clothing; sunglasses and broad spectrum sunscreen with SPF of at least 30 + and frequent self skin exams recommended - yearly exams by a dermatologist recommended for persons with history of PreCancerous Actinic Keratoses  INFLAMED SEBORRHEIC KERATOSIS (  4) R infraocular x 1, L post shoulder x 1, R flank at bra line x 1, R lateral lower leg x 1 (4) vs Porokeratosis  (L post shoulder).  Symptomatic, irritating, patient would  like treated. - Destruction of lesion - R infraocular x 1, L post shoulder x 1, R flank at bra line x 1, R lateral lower leg x 1 (4)  Destruction method: cryotherapy   Informed consent: discussed and consent obtained   Lesion destroyed using liquid nitrogen: Yes   Region frozen until ice ball extended beyond lesion: Yes   Outcome: patient tolerated procedure well with no complications   Post-procedure details: wound care instructions given   Additional details:  Prior to procedure, discussed risks of blister formation, small wound, skin dyspigmentation, or rare scar following cryotherapy. Recommend Vaseline ointment to treated areas while healing.   Return in about 1 year (around 11/03/2025) for TBSE.  IAndrea Kerns, CMA, am acting as scribe for Rexene Rattler, MD .   Documentation: I have reviewed the above documentation for accuracy and completeness, and I agree with the above.  Rexene Rattler, MD    "

## 2024-11-03 NOTE — Patient Instructions (Addendum)

## 2024-11-16 ENCOUNTER — Encounter: Payer: Self-pay | Admitting: Internal Medicine

## 2024-11-19 ENCOUNTER — Other Ambulatory Visit: Payer: Self-pay | Admitting: Cardiology

## 2024-11-19 DIAGNOSIS — R9431 Abnormal electrocardiogram [ECG] [EKG]: Secondary | ICD-10-CM

## 2024-11-19 DIAGNOSIS — R002 Palpitations: Secondary | ICD-10-CM | POA: Diagnosis not present

## 2024-11-25 ENCOUNTER — Ambulatory Visit: Attending: Internal Medicine | Admitting: Internal Medicine

## 2024-11-25 ENCOUNTER — Encounter: Payer: Self-pay | Admitting: Internal Medicine

## 2024-11-25 VITALS — BP 115/70 | HR 64 | Ht 69.0 in | Wt 121.0 lb

## 2024-11-25 DIAGNOSIS — Z79899 Other long term (current) drug therapy: Secondary | ICD-10-CM

## 2024-11-25 DIAGNOSIS — R9431 Abnormal electrocardiogram [ECG] [EKG]: Secondary | ICD-10-CM

## 2024-11-25 DIAGNOSIS — I471 Supraventricular tachycardia, unspecified: Secondary | ICD-10-CM | POA: Diagnosis not present

## 2024-11-25 DIAGNOSIS — Z8673 Personal history of transient ischemic attack (TIA), and cerebral infarction without residual deficits: Secondary | ICD-10-CM | POA: Diagnosis not present

## 2024-11-25 DIAGNOSIS — I4719 Other supraventricular tachycardia: Secondary | ICD-10-CM

## 2024-11-25 DIAGNOSIS — R002 Palpitations: Secondary | ICD-10-CM

## 2024-11-25 MED ORDER — ROSUVASTATIN CALCIUM 5 MG PO TABS: 5.0000 mg | ORAL_TABLET | Freq: Every day | ORAL | 3 refills | Status: AC

## 2024-11-25 MED ORDER — ASPIRIN 81 MG PO TBEC: 81.0000 mg | DELAYED_RELEASE_TABLET | Freq: Every day | ORAL | Status: AC

## 2024-11-25 NOTE — Progress Notes (Unsigned)
 " Cardiology Office Note:  .   Date:  11/26/2024  ID:  Erika Hurley, DOB 16-Aug-1966, MRN [id number] PCP: Antonette Angeline ORN, NP  [facility] Providers Cardiologist:  None     History of Present Illness: .   Erika Hurley is a 59 y.o. female with history of stroke, arthritis, and migraine headaches, referred for evaluation of abnormal heart monitor.  She complained of palpitations at her visit with Erika Hurley last month.  Event monitor showed predominantly sinus rhythm with 10 episodes of SVT lasting up to 20 seconds as well as possible junctional rhythm/functional tachycardia.  Today, Erika Hurley reports that she has been dealing with palpitations for over 30 years.  She describes it as her heart going crazy.  It will suddenly start beating fast, sometimes over 200 bpm, which can last for 5 minutes to 1 hour.  The events seem to happen randomly, often times multiple times a day.  This is complicated by the fact that her resting heart rate is usually around 60 bpm.  She notes having undergone tilt table testing at [facility] several years ago and was told that it was normal.  Palpitation episodes are often times associated with fatigue and her legs feeling heavy.  She has tried vagal maneuvers to abort the episodes without success.  Erika Hurley reports occasional pain around the left axilla that is not related to the palpitations or exertion.  She notes mild shortness of breath at times with the palpitations but not on a consistent basis.  She has not had any edema.  She describes a few episodes of lightheadedness during which her vision goes black.  The events most commonly happen when she is standing in hot environments; she wonders if they could be a heatstroke.  Erika Hurley reports that she was told that she has had multiple strokes that were incidentally uncovered on brain imaging.  She initially underwent neurologic evaluation because she would smell cigarette smoke even though  there was no smoke nearby.  She was previously on aspirin  but does not take this regularly.  She has not been on a statin recently but is instead taking red yeast rice.  ROS: See HPI  Studies Reviewed: SABRA   EKG Interpretation Date/Time:  Wednesday November 25 2024 15:53:59 EST Ventricular Rate:  64 PR Interval:  118 QRS Duration:  70 QT Interval:  406 QTC Calculation: 418 R Axis:   66  Text Interpretation: Normal sinus rhythm Normal ECG When compared with ECG of 03-Feb-2021 18:44, No significant change was found Confirmed by Seanpatrick Maisano, Lonni [phone]) on 11/26/2024 4:55:06 PM    Event monitor (10/21/2024): The patient was monitored for 6 days, 20 hours.  Predominant rhythm was sinus bradycardia with an average rate of 56 bpm (range 37-160 bpm).  Rare PACs and PVCs were noted.  10 supraventricular runs occurred, lasting up to 20 seconds.  Episodes of possible junctional rhythm/junctional tachycardia were noted, though evaluation was limited by artifact.  Risk Assessment/Calculations:             Physical Exam:   VS:  BP 115/70 (BP Location: Left Arm, Patient Position: Sitting, Cuff Size: Small)   Pulse 64 Comment: 69 oximeter  Ht 5' 9 (1.753 m)   Wt 121 lb (54.9 kg)   SpO2 96%   BMI 17.87 kg/m    Wt Readings from Last 3 Encounters:  11/25/24 121 lb (54.9 kg)  10/21/24 122 lb 12.8 oz (55.7 kg)  09/25/24 125 lb (56.7  kg)    General:  NAD.  Accompanied by her husband. Neck: No JVD or HJR. Lungs: Clear to auscultation bilaterally without wheezes or crackles. Heart: Regular rate and rhythm without murmurs, rubs, or gallops. Abdomen: Soft, nontender, nondistended. Extremities: No lower extremity edema.  ASSESSMENT AND PLAN: .    Palpitations, PSVT, and junctional rhythm/tachycardia: Erika Hurley reports a long history of palpitations with recent event monitor showing multiple supraventricular runs as well as episodes of possible junctional rhythm/tachycardia.  We discussed trial of  low-dose beta-blocker but have agreed to defer this given relatively low resting heart rate (60-65 bpm per her Apple Watch; mean rate 56 bpm on recent event monitor).  We will obtain an echocardiogram with bubble study to evaluate for structural abnormalities (including intracardiac shunting given history of possible strokes).  I will refer Erika Hurley to electrophysiology for further evaluation and management.  History of stroke: Prior brain MRI's showed white matter abnormalities suspicious for chronic small vessel ischemic disease and/or migraines.  I have encouraged her to take aspirin  81 mg daily.  We have also agreed to add rosuvastatin  5 mg daily in place of red yeast rice.  We will obtain an echocardiogram with bubble study to exclude intracardiac shunt.  We will also refer Erika Hurley to EP to determine if there is utility in loop recorder implantation to exclude PAF.    Dispo: Return to clinic in 3 months.  Signed, Lonni Hanson, MD  "

## 2024-11-25 NOTE — Patient Instructions (Signed)
 Medication Instructions:  Your physician recommends the following medication changes.  STOP TAKING: Red yeast rice  START TAKING: Asprin 81 mg by mouth daily Rosuvastatin  5 mg by mouth daily    *If you need a refill on your cardiac medications before your next appointment, please call your pharmacy*  Lab Work: Your provider would like for you to return in April, 2026 to have the following labs drawn: Lipid, LFT.   Please go to [facility] [address] [address], [place] [address] You do not need an appointment.  They are open from 8 am- 4:30 pm.  Lunch from 1:00 pm- 2:00 pm You will need to be fasting.    Testing/Procedures: Your physician has requested that you have an echocardiogram. Echocardiography is a painless test that uses sound waves to create images of your heart. It provides your doctor with information about the size and shape of your heart and how well your hearts chambers and valves are working.   You may receive an ultrasound enhancing agent through an IV if needed to better visualize your heart during the echo. This procedure takes approximately one hour.  There are no restrictions for this procedure.  This will take place at [address] [facility]) [address], [place] [address]  Please note: We ask at that you not bring children with you during ultrasound (echo/ vascular) testing. Due to room size and safety concerns, children are not allowed in the ultrasound rooms during exams. Our front office staff cannot provide observation of children in our lobby area while testing is being conducted. An adult accompanying a patient to their appointment will only be allowed in the ultrasound room at the discretion of the ultrasound technician under special circumstances. We apologize for any inconvenience.   Follow-Up: At [facility], you and your health needs are our priority.  As part of our continuing mission to  provide you with exceptional heart care, our providers are all part of one team.  This team includes your primary Cardiologist (physician) and Advanced Practice Providers or APPs (Physician Assistants and Nurse Practitioners) who all work together to provide you with the care you need, when you need it.  Your next appointment:   3 month(s)  Provider:   You may see Lonni Hanson, MD  or one of the following Advanced Practice Providers on your designated Care Team:   Lonni Meager, NP Lesley Maffucci, PA-C Bernardino Bring, PA-C Cadence Poca, PA-C Tylene Lunch, NP Barnie Hila, NP     Your physician recommends that you schedule a follow-up appointment as new EP consult

## 2024-11-26 ENCOUNTER — Encounter: Payer: Self-pay | Admitting: Internal Medicine

## 2024-11-26 DIAGNOSIS — R002 Palpitations: Secondary | ICD-10-CM | POA: Insufficient documentation

## 2024-11-26 DIAGNOSIS — I4719 Other supraventricular tachycardia: Secondary | ICD-10-CM | POA: Insufficient documentation

## 2024-12-08 ENCOUNTER — Ambulatory Visit

## 2024-12-08 DIAGNOSIS — Z8673 Personal history of transient ischemic attack (TIA), and cerebral infarction without residual deficits: Secondary | ICD-10-CM

## 2024-12-08 LAB — ECHOCARDIOGRAM COMPLETE BUBBLE STUDY
AR max vel: 2.68 cm2
AV Area VTI: 2.84 cm2
AV Area mean vel: 2.53 cm2
AV Mean grad: 3 mmHg
AV Peak grad: 6.1 mmHg
Ao pk vel: 1.23 m/s
Area-P 1/2: 3.77 cm2
Calc EF: 54.8 %
S' Lateral: 3.5 cm
Single Plane A2C EF: 57.2 %
Single Plane A4C EF: 51.6 %

## 2024-12-09 ENCOUNTER — Ambulatory Visit: Payer: Self-pay | Admitting: Internal Medicine

## 2024-12-10 NOTE — Progress Notes (Unsigned)
 "     Electrophysiology Clinic Note    Date:  12/11/2024  Patient ID:  Erika Hurley, Hurley 1966-10-23, MRN [id number] PCP:  Erika Angeline ORN, NP  Cardiologist:  Lonni Hanson, MD      Discussed the use of AI scribe software for clinical note transcription with the patient, who gave verbal consent to proceed.   Patient Profile    Chief Complaint: palpitations  History of Present Illness: Erika Hurley is a 59 y.o. female with PMH notable for SVT, recurrent CVA, recurrent syncope, arthritis, migraines; seen today for EP evaluation of SVT.   She saw Dr. Hanson recently after zio monitor revealed SVT, possible junctional tachycardia.  The zio monitor was ordered by PCP after patient complained of palpitations x 30 years with periods where her heart rate rose to 200bpm lasting up to 1 hour. Vagal maneuvers unsuccessful.  During appointment with Dr. Hanson they discussed starting low-dose BB, but deferred. Bubble TTE without concerning findings.  Referred to EP to eval for ILR and SVT treatment.   Today, she reports daily palpitations for past several weeks, increasing in frequency. Episodes last 15 to 20 minutes. She has noticed that most episodes happen before noon. She monitors her pulse with her apple watch, which does not have EKG capabilities. She has seen heart rates up to 192 beats per minute. She can see her shirt moving with her heartbeat and feels exhausted with heavy legs afterward.  She has had strokes in the past and underwent a bubble study to evaluate for PFO, as well as a tilt table test. She is worried that her rapid heart rate episodes could be related to her stroke risk.  She recalls a prior heat-related event at a football game with transient vision loss and several similar episodes during exertion in heat. These required active cooling. She recalls several additional syncopal episodes, but these have not happened in years.   She reports poor appetite with an  unintentional 10 pound weight loss. She continues to eat small amounts, with little appetite for dinner. Her symptoms are more pronounced before noon.  She denies chest pain, chest pressure. No SOB or edema.     Arrhythmia/Device History No specialty comments available.    ROS:  Please see the history of present illness. All other systems are reviewed and otherwise negative.    Physical Exam    VS:  BP 120/70 (BP Location: Left Arm, Patient Position: Sitting, Cuff Size: Normal)   Pulse 76   Ht 5' 9 (1.753 m)   Wt 120 lb (54.4 kg)   SpO2 99%   BMI 17.72 kg/m  BMI: Body mass index is 17.72 kg/m.           Wt Readings from Last 3 Encounters:  12/11/24 120 lb (54.4 kg)  11/25/24 121 lb (54.9 kg)  10/21/24 122 lb 12.8 oz (55.7 kg)      GEN- The patient is well appearing, alert and oriented x 3 today.   Lungs- Clear to ausculation bilaterally, normal work of breathing.  Heart- Regular rate and rhythm, no murmurs, rubs or gallops Extremities- No peripheral edema, warm, dry   Studies Reviewed   Previous EP, cardiology notes.    EKG is not ordered. Personal review of EKG from 11/26/2024 shows:  SR at 64        Bubble TTE, 12/08/2024 1. Left ventricular ejection fraction, by estimation, is 50 to 55%. The left ventricle has low normal function. Left ventricular endocardial  border not optimally defined to evaluate regional wall motion. Left ventricular diastolic parameters were normal.   2. Right ventricular systolic function is normal. The right ventricular size is normal.   3. The mitral valve is normal in structure. Trivial mitral valve regurgitation. No evidence of mitral stenosis.   4. The aortic valve is tricuspid. Aortic valve regurgitation is not visualized. No aortic stenosis is present.   5. The inferior vena cava is normal in size with greater than 50% respiratory variability, suggesting right atrial pressure of 3 mmHg.   6. Agitated saline contrast bubble study  was negative, with no evidence of any interatrial shunt.   Comparison(s): No prior Echocardiogram.   Conclusion(s)/Recommendation(s): Normal biventricular function without evidence of hemodynamically significant valvular heart disease. No intracardiac source of embolism detected on this transthoracic study. Consider a transesophageal echocardiogram to exclude cardiac source of embolism if clinically indicated.   Long term monitor, 11/16/2024 Patch Wear Time:  6 days and 20 hours (2025-12-19T15:55:13-0500 to 2025-12-26T12:16:35-0500)   HR 37 - 160, average 56 bpm. 10 nonsustained SVT (longest 19.8 seconds). No atrial fibrillation detected. Rare supraventricular ectopy. Rare ventricular ectopy. No sustained arrhythmias. Symptom trigger episodes correspond to predominantly sinus rhythm, some possible junctional rhythm/tachycardia, difficult to ascertain at times due to artifact.   Assessment and Plan     #) recurrent syncope #) recurrent CVA #) SVT #) palpitations She recalls palpitation episodes for years that were quite rare but are now increasing in frequency, now having episodes multiple times per day. Recent zio with several SVT episodes, junctional tachycardia. There was significant artifact on the zio monitor, which limit interpretation.  Further, no AFib episodes to explain CVA, or significant brady or pause episodes to explain recurrent syncope.  We discussed ILR implant to further evaluate all of these episodes.  Reviewed procedural risks/benefits including procedural expectations and monthly monitoring. She is agreeable to proceed with ILR, will send to precert and have her follow-up with Dr. Kennyth for implant.  Will start lopressor  12.5mg  BID. I've encouraged her to try it for 2 weeks, and to notify me if she becomes dizzy or LH. If tolerates, consider increasing dose.   #) unexplained weight loss Encouraged small meals throughout the day Recommend she discuss unintended  weight loss with PCP       Current medicines are reviewed at length with the patient today.   The patient does not have concerns regarding her medicines.  The following changes were made today:   START 12.5mg  lopressor  BID  Labs/ tests ordered today include:  No orders of the defined types were placed in this encounter.    Disposition: Follow up with Dr. Kennyth for ILR implant , EP APP in 3-6 months   Signed, Gwenda Heiner, NP  12/11/24  5:05 PM  Electrophysiology [facility] "

## 2024-12-11 ENCOUNTER — Encounter: Payer: Self-pay | Admitting: Cardiology

## 2024-12-11 ENCOUNTER — Ambulatory Visit: Admitting: Cardiology

## 2024-12-11 VITALS — BP 120/70 | HR 76 | Ht 69.0 in | Wt 120.0 lb

## 2024-12-11 DIAGNOSIS — R55 Syncope and collapse: Secondary | ICD-10-CM

## 2024-12-11 DIAGNOSIS — R002 Palpitations: Secondary | ICD-10-CM

## 2024-12-11 DIAGNOSIS — I471 Supraventricular tachycardia, unspecified: Secondary | ICD-10-CM

## 2024-12-11 DIAGNOSIS — Z8673 Personal history of transient ischemic attack (TIA), and cerebral infarction without residual deficits: Secondary | ICD-10-CM

## 2024-12-11 MED ORDER — METOPROLOL TARTRATE 25 MG PO TABS: 12.5000 mg | ORAL_TABLET | Freq: Two times a day (BID) | ORAL | 3 refills | Status: AC

## 2024-12-11 NOTE — Patient Instructions (Addendum)
 Medication Instructions:  Your physician recommends the following medication changes.   START TAKING: Metoprolol  (Lopressor ). 12.5 mg twice a day. Let us  know after two weeks if you are not tolerating this well.   *If you need a refill on your cardiac medications before your next appointment, please call your pharmacy*  Lab Work: No labs ordered today    Testing/Procedures: No test ordered today   Follow-Up:  Dr. Shaune team will reach out to you to set up an appointment for the Loop Recorder.  At [facility], you and your health needs are our priority.  As part of our continuing mission to provide you with exceptional heart care, our providers are all part of one team.  This team includes your primary Cardiologist (physician) and Advanced Practice Providers or APPs (Physician Assistants and Nurse Practitioners) who all work together to provide you with the care you need, when you need it.  Your next appointment:   3 to 6 month(s) after the Loop Recorder placement.  Provider:   Suzann Riddle, NP

## 2024-12-23 ENCOUNTER — Encounter

## 2025-02-24 ENCOUNTER — Ambulatory Visit: Admitting: Internal Medicine

## 2025-04-21 ENCOUNTER — Ambulatory Visit: Admitting: Internal Medicine

## 2025-11-23 ENCOUNTER — Ambulatory Visit: Admitting: Dermatology
